# Patient Record
Sex: Female | Born: 1935 | Race: White | Hispanic: No | Marital: Married | Smoking: Never smoker
Health system: Southern US, Community
[De-identification: ages and names within clinical notes are randomized; demographics above are authoritative.]

## PROBLEM LIST (undated history)

## (undated) DIAGNOSIS — N390 Urinary tract infection, site not specified: Secondary | ICD-10-CM

## (undated) DIAGNOSIS — D6959 Other secondary thrombocytopenia: Secondary | ICD-10-CM

## (undated) DIAGNOSIS — Z9289 Personal history of other medical treatment: Secondary | ICD-10-CM

## (undated) DIAGNOSIS — E119 Type 2 diabetes mellitus without complications: Secondary | ICD-10-CM

## (undated) DIAGNOSIS — E282 Polycystic ovarian syndrome: Secondary | ICD-10-CM

## (undated) DIAGNOSIS — E781 Pure hyperglyceridemia: Secondary | ICD-10-CM

## (undated) DIAGNOSIS — Z951 Presence of aortocoronary bypass graft: Secondary | ICD-10-CM

## (undated) DIAGNOSIS — I10 Essential (primary) hypertension: Secondary | ICD-10-CM

## (undated) DIAGNOSIS — K219 Gastro-esophageal reflux disease without esophagitis: Secondary | ICD-10-CM

## (undated) DIAGNOSIS — I2510 Atherosclerotic heart disease of native coronary artery without angina pectoris: Secondary | ICD-10-CM

## (undated) DIAGNOSIS — E060 Acute thyroiditis: Secondary | ICD-10-CM

## (undated) DIAGNOSIS — J189 Pneumonia, unspecified organism: Secondary | ICD-10-CM

## (undated) DIAGNOSIS — E7800 Pure hypercholesterolemia, unspecified: Secondary | ICD-10-CM

## (undated) DIAGNOSIS — E875 Hyperkalemia: Secondary | ICD-10-CM

## (undated) HISTORY — DX: Pure hypercholesterolemia, unspecified: E78.00

## (undated) HISTORY — DX: Acute thyroiditis: E06.0

## (undated) HISTORY — PX: LAPAROSCOPIC CHOLECYSTECTOMY: SUR755

## (undated) HISTORY — PX: DILATION AND CURETTAGE OF UTERUS: SHX78

## (undated) HISTORY — PX: CATARACT EXTRACTION W/ INTRAOCULAR LENS  IMPLANT, BILATERAL: SHX1307

## (undated) HISTORY — PX: TUBAL LIGATION: SHX77

## (undated) HISTORY — DX: Hyperkalemia: E87.5

## (undated) HISTORY — DX: Presence of aortocoronary bypass graft: Z95.1

## (undated) HISTORY — DX: Polycystic ovarian syndrome: E28.2

## (undated) HISTORY — DX: Pure hyperglyceridemia: E78.1

## (undated) HISTORY — DX: Urinary tract infection, site not specified: N39.0

## (undated) HISTORY — DX: Essential (primary) hypertension: I10

## (undated) HISTORY — DX: Atherosclerotic heart disease of native coronary artery without angina pectoris: I25.10

## (undated) HISTORY — DX: Other secondary thrombocytopenia: D69.59

---

## 2008-05-17 DIAGNOSIS — Z9289 Personal history of other medical treatment: Secondary | ICD-10-CM

## 2008-05-17 HISTORY — PX: CARDIAC CATHETERIZATION: SHX172

## 2008-05-17 HISTORY — DX: Personal history of other medical treatment: Z92.89

## 2008-05-17 HISTORY — PX: CORONARY ARTERY BYPASS GRAFT: SHX141

## 2012-01-20 DIAGNOSIS — E109 Type 1 diabetes mellitus without complications: Secondary | ICD-10-CM | POA: Diagnosis not present

## 2012-01-20 DIAGNOSIS — E7800 Pure hypercholesterolemia, unspecified: Secondary | ICD-10-CM | POA: Diagnosis not present

## 2012-01-20 DIAGNOSIS — I10 Essential (primary) hypertension: Secondary | ICD-10-CM | POA: Diagnosis not present

## 2012-02-10 DIAGNOSIS — I10 Essential (primary) hypertension: Secondary | ICD-10-CM | POA: Diagnosis not present

## 2012-02-10 DIAGNOSIS — E109 Type 1 diabetes mellitus without complications: Secondary | ICD-10-CM | POA: Diagnosis not present

## 2012-02-10 DIAGNOSIS — E7800 Pure hypercholesterolemia, unspecified: Secondary | ICD-10-CM | POA: Diagnosis not present

## 2012-09-04 DIAGNOSIS — I10 Essential (primary) hypertension: Secondary | ICD-10-CM | POA: Diagnosis not present

## 2012-09-04 DIAGNOSIS — M25519 Pain in unspecified shoulder: Secondary | ICD-10-CM | POA: Diagnosis not present

## 2012-09-04 DIAGNOSIS — E7800 Pure hypercholesterolemia, unspecified: Secondary | ICD-10-CM | POA: Diagnosis not present

## 2012-10-03 ENCOUNTER — Encounter: Payer: Self-pay | Admitting: *Deleted

## 2012-10-03 ENCOUNTER — Ambulatory Visit (INDEPENDENT_AMBULATORY_CARE_PROVIDER_SITE_OTHER): Payer: Medicare Other | Admitting: Cardiology

## 2012-10-03 ENCOUNTER — Encounter: Payer: Self-pay | Admitting: Cardiology

## 2012-10-03 VITALS — BP 160/90 | HR 66 | Ht 63.0 in | Wt 156.0 lb

## 2012-10-03 DIAGNOSIS — I2510 Atherosclerotic heart disease of native coronary artery without angina pectoris: Secondary | ICD-10-CM | POA: Insufficient documentation

## 2012-10-03 DIAGNOSIS — I10 Essential (primary) hypertension: Secondary | ICD-10-CM | POA: Diagnosis not present

## 2012-10-03 DIAGNOSIS — E781 Pure hyperglyceridemia: Secondary | ICD-10-CM

## 2012-10-03 DIAGNOSIS — Z951 Presence of aortocoronary bypass graft: Secondary | ICD-10-CM | POA: Diagnosis not present

## 2012-10-03 NOTE — Patient Instructions (Signed)
Continue your current medication  Add fish oil 2000 mg daily  I will see you again in 6 months.

## 2012-10-03 NOTE — Progress Notes (Signed)
Jennifer Warner. [name] Date of Birth: [birth year] Medical Record #[id number]  History of Present Illness: Jennifer Warner is seen at the request of Dr. Chestine Spore to establish cardiac care. She is a very pleasant 78 year old white female who has a history of hypertension and dyslipidemia. She also has a history of diabetes. In 2009 she presented with new onset of angina. A Myoview study was abnormal. This led to cardiac catheterization which demonstrated a critical ostial LAD stenosis. She septal he underwent two-vessel coronary bypass surgery in [place] with an LIMA graft to the LAD and a saphenous vein graft to the RCA. She has done very well since her surgery without any recurrent symptoms of angina. She has had no shortness of breath or palpitations. He does stay active primarily with walking. Her husband is a Optician, dispensing and recently took a post here in [place].  Current Outpatient Prescriptions on File Prior to Visit  Medication Sig Dispense Refill  . atorvastatin (LIPITOR) 10 MG tablet Take 10 mg by mouth daily.       . diphenhydramine-acetaminophen (TYLENOL PM) 25-500 MG TABS Take 2 tablets by mouth at bedtime.      . fenofibrate (TRICOR) 145 MG tablet Take 145 mg by mouth daily.       . hydrochlorothiazide (MICROZIDE) 12.5 MG capsule Take 12.5 mg by mouth daily.       Marland Kitchen LANTUS SOLOSTAR 100 UNIT/ML injection Inject 20 Units into the skin at bedtime.       Marland Kitchen losartan (COZAAR) 100 MG tablet Take 100 mg by mouth daily.       . metoprolol tartrate (LOPRESSOR) 25 MG tablet Take 25 mg by mouth 2 (two) times daily.       Marland Kitchen omeprazole (PRILOSEC) 20 MG capsule Take 20 mg by mouth daily.         No Known Allergies  Past Medical History  Diagnosis Date  . CAD (coronary artery disease)   . Diabetes   . Reflux   . Acute thyroiditis   . Polycystic ovaries   . Pure hypercholesterolemia   . Hypertriglyceridemia   . Essential hypertension, benign   . Urinary tract infection, site not specified     . Postsurgical aortocoronary bypass status   . Other secondary thrombocytopenia   . Hyperkalemia   . S/P CABG x 2     Past Surgical History  Procedure Date  . Cholecystectomy   . S/p cabg 8/09    [name] to LAD, SVG to PDA    History  Smoking status  . Never Smoker   Smokeless tobacco  . Not on file    History  Alcohol Use: Not on file    Family History  Problem Relation Age of Onset  . Liver disease Mother 31    deceased  . Cancer Brother     x3  . Stroke Brother     Review of Systems: The review of systems is positive for *as noted in history of present illness.  All other systems were reviewed and are negative.  Physical Exam: BP 160/90  Pulse 66  Ht 5\' 3"  (1.6 m)  Wt 156 lb (70.761 kg)  BMI 27.63 kg/m2 She is very pleasant white female in no acute distress. She is obese.The patient is alert and oriented x 3.  The mood and affect are normal.  The skin is warm and dry.  Color is normal.  The HEENT exam reveals that the sclera are nonicteric.  The mucous membranes are  moist.  The carotids are 2+ without bruits.  There is no thyromegaly.  There is no JVD.  The lungs are clear.  The chest wall is non tender.  She has an old median sternotomy scar. The heart exam reveals a regular rate with a normal S1 and S2.  There are no murmurs, gallops, or rubs.  The PMI is not displaced.   Abdominal exam reveals good bowel sounds.  There is no guarding or rebound.  There is no hepatosplenomegaly or tenderness.  There are no masses.  Exam of the legs reveal no clubbing, cyanosis, or edema.  The legs are without rashes.  The distal pulses are intact.  Cranial nerves II - XII are intact.  Motor and sensory functions are intact.  The gait is normal.  LABORATORY DATA: ECG today demonstrates normal sinus rhythm with a normal ECG. Laboratory data from 09/04/2012 showed a total cholesterol 188, triglycerides 307, HDL 33, and LDL of 94. Glucose is 123. BUN 24, creatinine 1.36. A1c was  7.1%. Assessment / Plan: 1. Coronary disease status post 2 vessel CABG in August of 2009. She is asymptomatic. ECG is normal. We'll continue with her current medical therapy and risk factor modification. I'll followup again in 6 months.  2. Hyperlipidemia. Given her elevated triglycerides I recommended she had 2 g of fish oil daily. Continue TriCor and Lipitor.  3. Diabetes mellitus type 2.  3. Chronic kidney disease stage III. Patient is on an ARB.

## 2012-12-15 DIAGNOSIS — J189 Pneumonia, unspecified organism: Secondary | ICD-10-CM

## 2012-12-15 HISTORY — DX: Pneumonia, unspecified organism: J18.9

## 2013-01-07 ENCOUNTER — Emergency Department (HOSPITAL_COMMUNITY): Payer: Medicare Other

## 2013-01-07 ENCOUNTER — Inpatient Hospital Stay (HOSPITAL_COMMUNITY)
Admission: EM | Admit: 2013-01-07 | Discharge: 2013-01-09 | DRG: 195 | Disposition: A | Discharge status: Home / Self Care | Payer: Medicare Other | Attending: Internal Medicine | Admitting: Internal Medicine

## 2013-01-07 ENCOUNTER — Encounter (HOSPITAL_COMMUNITY): Payer: Self-pay | Admitting: Emergency Medicine

## 2013-01-07 DIAGNOSIS — E282 Polycystic ovarian syndrome: Secondary | ICD-10-CM | POA: Diagnosis present

## 2013-01-07 DIAGNOSIS — J189 Pneumonia, unspecified organism: Secondary | ICD-10-CM | POA: Diagnosis not present

## 2013-01-07 DIAGNOSIS — Z7982 Long term (current) use of aspirin: Secondary | ICD-10-CM

## 2013-01-07 DIAGNOSIS — R0602 Shortness of breath: Secondary | ICD-10-CM | POA: Diagnosis not present

## 2013-01-07 DIAGNOSIS — E785 Hyperlipidemia, unspecified: Secondary | ICD-10-CM | POA: Diagnosis present

## 2013-01-07 DIAGNOSIS — Z794 Long term (current) use of insulin: Secondary | ICD-10-CM

## 2013-01-07 DIAGNOSIS — J984 Other disorders of lung: Secondary | ICD-10-CM | POA: Diagnosis not present

## 2013-01-07 DIAGNOSIS — I2510 Atherosclerotic heart disease of native coronary artery without angina pectoris: Secondary | ICD-10-CM | POA: Diagnosis not present

## 2013-01-07 DIAGNOSIS — E7800 Pure hypercholesterolemia, unspecified: Secondary | ICD-10-CM | POA: Diagnosis present

## 2013-01-07 DIAGNOSIS — R079 Chest pain, unspecified: Secondary | ICD-10-CM | POA: Diagnosis not present

## 2013-01-07 DIAGNOSIS — K219 Gastro-esophageal reflux disease without esophagitis: Secondary | ICD-10-CM | POA: Diagnosis present

## 2013-01-07 DIAGNOSIS — Z79899 Other long term (current) drug therapy: Secondary | ICD-10-CM

## 2013-01-07 DIAGNOSIS — R918 Other nonspecific abnormal finding of lung field: Secondary | ICD-10-CM | POA: Diagnosis not present

## 2013-01-07 DIAGNOSIS — I10 Essential (primary) hypertension: Secondary | ICD-10-CM | POA: Diagnosis not present

## 2013-01-07 DIAGNOSIS — E781 Pure hyperglyceridemia: Secondary | ICD-10-CM | POA: Diagnosis not present

## 2013-01-07 DIAGNOSIS — Z951 Presence of aortocoronary bypass graft: Secondary | ICD-10-CM

## 2013-01-07 DIAGNOSIS — E119 Type 2 diabetes mellitus without complications: Secondary | ICD-10-CM | POA: Diagnosis not present

## 2013-01-07 LAB — HEPATIC FUNCTION PANEL
ALT: 58 U/L — ABNORMAL HIGH (ref 0–35)
AST: 59 U/L — ABNORMAL HIGH (ref 0–37)
Alkaline Phosphatase: 53 U/L (ref 39–117)
Total Protein: 8.4 g/dL — ABNORMAL HIGH (ref 6.0–8.3)

## 2013-01-07 LAB — URINALYSIS, ROUTINE W REFLEX MICROSCOPIC
Glucose, UA: 100 mg/dL — AB
Ketones, ur: 15 mg/dL — AB
Specific Gravity, Urine: 1.024 (ref 1.005–1.030)
pH: 5 (ref 5.0–8.0)

## 2013-01-07 LAB — URINE MICROSCOPIC-ADD ON

## 2013-01-07 LAB — GLUCOSE, CAPILLARY: Glucose-Capillary: 216 mg/dL — ABNORMAL HIGH (ref 70–99)

## 2013-01-07 LAB — CBC
Hemoglobin: 11.9 g/dL — ABNORMAL LOW (ref 12.0–15.0)
Platelets: 292 10*3/uL (ref 150–400)
RBC: 3.82 MIL/uL — ABNORMAL LOW (ref 3.87–5.11)
WBC: 14 10*3/uL — ABNORMAL HIGH (ref 4.0–10.5)

## 2013-01-07 LAB — BASIC METABOLIC PANEL
CO2: 22 mEq/L (ref 19–32)
Calcium: 10.4 mg/dL (ref 8.4–10.5)
GFR calc non Af Amer: 25 mL/min — ABNORMAL LOW (ref >90)
Glucose, Bld: 275 mg/dL — ABNORMAL HIGH (ref 70–99)
Potassium: 3.9 mEq/L (ref 3.5–5.1)
Sodium: 135 mEq/L (ref 135–145)

## 2013-01-07 LAB — POCT I-STAT TROPONIN I

## 2013-01-07 MED ORDER — SODIUM CHLORIDE 0.9 % IV SOLN
INTRAVENOUS | Status: AC
  Administered 2013-01-07: 100 mL/h via INTRAVENOUS
  Administered 2013-01-08: 08:00:00 via INTRAVENOUS

## 2013-01-07 MED ORDER — METOPROLOL TARTRATE 25 MG PO TABS
25.0000 mg | ORAL_TABLET | Freq: Two times a day (BID) | ORAL | Status: DC
  Administered 2013-01-07 – 2013-01-09 (×4): 25 mg via ORAL
  Filled 2013-01-07 (×5): qty 1

## 2013-01-07 MED ORDER — HYDROCODONE-ACETAMINOPHEN 5-325 MG PO TABS: 1.0000 | ORAL_TABLET | ORAL | Status: DC | PRN

## 2013-01-07 MED ORDER — ONDANSETRON HCL 4 MG/2ML IJ SOLN: 4.0000 mg | Freq: Four times a day (QID) | INTRAMUSCULAR | Status: DC | PRN

## 2013-01-07 MED ORDER — DIPHENHYDRAMINE-APAP (SLEEP) 25-500 MG PO TABS: 2.0000 | ORAL_TABLET | Freq: Every day | ORAL | Status: DC

## 2013-01-07 MED ORDER — TECHNETIUM TO 99M ALBUMIN AGGREGATED
6.0000 | Freq: Once | INTRAVENOUS | Status: AC | PRN
  Administered 2013-01-07: 6 via INTRAVENOUS

## 2013-01-07 MED ORDER — LOSARTAN POTASSIUM 50 MG PO TABS
100.0000 mg | ORAL_TABLET | Freq: Every day | ORAL | Status: DC
  Administered 2013-01-07 – 2013-01-09 (×3): 100 mg via ORAL
  Filled 2013-01-07 (×3): qty 2

## 2013-01-07 MED ORDER — HEPARIN SODIUM (PORCINE) 5000 UNIT/ML IJ SOLN
5000.0000 [IU] | Freq: Three times a day (TID) | INTRAMUSCULAR | Status: DC
  Administered 2013-01-07 – 2013-01-09 (×5): 5000 [IU] via SUBCUTANEOUS
  Filled 2013-01-07 (×8): qty 1

## 2013-01-07 MED ORDER — ONDANSETRON HCL 4 MG PO TABS: 4.0000 mg | ORAL_TABLET | Freq: Four times a day (QID) | ORAL | Status: DC | PRN

## 2013-01-07 MED ORDER — SODIUM CHLORIDE 0.9 % IV BOLUS (SEPSIS)
1000.0000 mL | Freq: Once | INTRAVENOUS | Status: AC
  Administered 2013-01-07: 1000 mL via INTRAVENOUS

## 2013-01-07 MED ORDER — ATORVASTATIN CALCIUM 10 MG PO TABS
10.0000 mg | ORAL_TABLET | Freq: Every evening | ORAL | Status: DC
  Administered 2013-01-07 – 2013-01-08 (×2): 10 mg via ORAL
  Filled 2013-01-07 (×3): qty 1

## 2013-01-07 MED ORDER — OXYCODONE-ACETAMINOPHEN 5-325 MG PO TABS
1.0000 | ORAL_TABLET | Freq: Once | ORAL | Status: AC
  Administered 2013-01-07: 1 via ORAL
  Filled 2013-01-07: qty 1

## 2013-01-07 MED ORDER — FENOFIBRATE 160 MG PO TABS
160.0000 mg | ORAL_TABLET | Freq: Every day | ORAL | Status: DC
  Administered 2013-01-07 – 2013-01-09 (×3): 160 mg via ORAL
  Filled 2013-01-07 (×3): qty 1

## 2013-01-07 MED ORDER — DEXTROSE 5 % IV SOLN
500.0000 mg | INTRAVENOUS | Status: DC
  Administered 2013-01-08: 500 mg via INTRAVENOUS
  Filled 2013-01-07 (×2): qty 500

## 2013-01-07 MED ORDER — AZITHROMYCIN 250 MG PO TABS
500.0000 mg | ORAL_TABLET | Freq: Once | ORAL | Status: AC
  Administered 2013-01-07: 500 mg via ORAL
  Filled 2013-01-07: qty 2

## 2013-01-07 MED ORDER — ASPIRIN 81 MG PO CHEW
81.0000 mg | CHEWABLE_TABLET | Freq: Every day | ORAL | Status: DC
  Administered 2013-01-07 – 2013-01-09 (×3): 81 mg via ORAL
  Filled 2013-01-07 (×3): qty 1

## 2013-01-07 MED ORDER — TECHNETIUM TC 99M DIETHYLENETRIAME-PENTAACETIC ACID: 40.0000 | Freq: Once | INTRAVENOUS | Status: DC | PRN

## 2013-01-07 MED ORDER — INSULIN ASPART 100 UNIT/ML ~~LOC~~ SOLN
0.0000 [IU] | Freq: Every day | SUBCUTANEOUS | Status: DC
  Administered 2013-01-07: 3 [IU] via SUBCUTANEOUS

## 2013-01-07 MED ORDER — ZOLPIDEM TARTRATE 5 MG PO TABS
5.0000 mg | ORAL_TABLET | Freq: Every evening | ORAL | Status: DC | PRN
  Administered 2013-01-07: 5 mg via ORAL
  Filled 2013-01-07: qty 1

## 2013-01-07 MED ORDER — SODIUM CHLORIDE 0.9 % IJ SOLN
3.0000 mL | Freq: Two times a day (BID) | INTRAMUSCULAR | Status: DC
  Administered 2013-01-07 – 2013-01-09 (×4): 3 mL via INTRAVENOUS

## 2013-01-07 MED ORDER — DEXTROSE 5 % IV SOLN
1.0000 g | INTRAVENOUS | Status: DC
  Administered 2013-01-08: 1 g via INTRAVENOUS
  Filled 2013-01-07 (×2): qty 10

## 2013-01-07 MED ORDER — HYDROCHLOROTHIAZIDE 12.5 MG PO CAPS
12.5000 mg | ORAL_CAPSULE | Freq: Every day | ORAL | Status: DC
  Administered 2013-01-07 – 2013-01-09 (×3): 12.5 mg via ORAL
  Filled 2013-01-07 (×3): qty 1

## 2013-01-07 MED ORDER — DEXTROSE 5 % IV SOLN
1.0000 g | Freq: Once | INTRAVENOUS | Status: AC
  Administered 2013-01-07: 1 g via INTRAVENOUS
  Filled 2013-01-07: qty 10

## 2013-01-07 MED ORDER — PANTOPRAZOLE SODIUM 40 MG PO TBEC
40.0000 mg | DELAYED_RELEASE_TABLET | Freq: Every day | ORAL | Status: DC
  Administered 2013-01-07 – 2013-01-09 (×3): 40 mg via ORAL
  Filled 2013-01-07 (×2): qty 1

## 2013-01-07 MED ORDER — INSULIN ASPART 100 UNIT/ML ~~LOC~~ SOLN
0.0000 [IU] | Freq: Three times a day (TID) | SUBCUTANEOUS | Status: DC
  Administered 2013-01-08 – 2013-01-09 (×4): 2 [IU] via SUBCUTANEOUS

## 2013-01-07 NOTE — ED Provider Notes (Signed)
MSE was initiated and I personally evaluated the patient and placed orders (if any) at  2:49 PM on January 07, 2013.  The patient appears stable so that the remainder of the MSE may be completed by another provider.  I discussed the patient's x-ray with our radiologist.  Given the patient's abnormal x-ray finding she will have a CT scan of her chest.  On my brief exam the patient is awake, alert, ambulatory, and in no distress, speaking clearly, with no overt dyspnea.  Gerhard Munch, MD 01/07/13 1450

## 2013-01-07 NOTE — ED Provider Notes (Signed)
History     CSN: [id number]  Arrival date & time 01/07/13  1321   First MD Initiated Contact with Patient 01/07/13 1531      Chief Complaint  Patient presents with  . Back Pain  . Tachycardia    (Consider location/radiation/quality/duration/timing/severity/associated sxs/prior treatment) Patient is a 80 y.o. female presenting with back pain.  Back Pain Associated symptoms: chest pain   Associated symptoms: no abdominal pain, no headaches, no numbness and no weakness    patient presents with pain in her right posterior chest. Worse with breathing. Rather acute onset. Chronic cough. Not coughing anything up. No fevers. No abdominal pain. no dysuria. Also some pain in her lower back to her right hip. Worse with movement.   Past Medical History  Diagnosis Date  . CAD (coronary artery disease)   . Diabetes   . Reflux   . Acute thyroiditis   . Polycystic ovaries   . Pure hypercholesterolemia   . Hypertriglyceridemia   . Essential hypertension, benign   . Urinary tract infection, site not specified   . Postsurgical aortocoronary bypass status   . Other secondary thrombocytopenia   . Hyperkalemia   . S/P CABG x 2     Past Surgical History  Procedure Laterality Date  . Cholecystectomy    . S/p cabg  8/09    [name] to LAD, SVG to PDA    Family History  Problem Relation Age of Onset  . Liver disease Mother 73    deceased  . Cancer Brother     x3  . Stroke Brother     History  Substance Use Topics  . Smoking status: Never Smoker   . Smokeless tobacco: Not on file  . Alcohol Use: Not on file    OB History   Grav Para Term Preterm Abortions TAB SAB Ect Mult Living                  Review of Systems  Constitutional: Negative for activity change and appetite change.  HENT: Negative for neck stiffness.   Eyes: Negative for pain.  Respiratory: Positive for cough and shortness of breath. Negative for chest tightness.   Cardiovascular: Positive for chest pain.  Negative for leg swelling.  Gastrointestinal: Negative for nausea, vomiting, abdominal pain and diarrhea.  Genitourinary: Negative for flank pain.  Musculoskeletal: Positive for back pain.  Skin: Negative for rash.  Neurological: Negative for weakness, numbness and headaches.  Psychiatric/Behavioral: Negative for behavioral problems.    Allergies  Review of patient's allergies indicates no known allergies.  Home Medications   No current outpatient prescriptions on file.  BP 168/67  Pulse 82  Temp(Src) 98.7 F (37.1 C) (Oral)  Resp 20  Ht 5\' 3"  (1.6 m)  Wt 154 lb 14.4 oz (70.262 kg)  BMI 27.45 kg/m2  SpO2 93%  Physical Exam  Nursing note and vitals reviewed. Constitutional: She is oriented to person, place, and time. She appears well-developed and well-nourished.  HENT:  Head: Normocephalic and atraumatic.  Eyes: EOM are normal. Pupils are equal, round, and reactive to light.  Neck: Normal range of motion. Neck supple.  Cardiovascular: Regular rhythm and normal heart sounds.   No murmur heard. Tachycardia.  Pulmonary/Chest: Effort normal and breath sounds normal. No respiratory distress. She has no wheezes. She has no rales. She exhibits tenderness.  Tenderness to right lower posterior chest.  Abdominal: Soft. Bowel sounds are normal. She exhibits no distension. There is no tenderness. There is no rebound  and no guarding.  Musculoskeletal: Normal range of motion.  Neurological: She is alert and oriented to person, place, and time. No cranial nerve deficit.  Skin: Skin is warm and dry.  Psychiatric: She has a normal mood and affect. Her speech is normal.    ED Course  Procedures (including critical care time)  Labs Reviewed  BASIC METABOLIC PANEL - Abnormal; Notable for the following:    Glucose, Bld 275 (*)    BUN 27 (*)    Creatinine, Ser 1.88 (*)    GFR calc non Af Amer 25 (*)    GFR calc Af Amer 29 (*)    All other components within normal limits  CBC -  Abnormal; Notable for the following:    WBC 14.0 (*)    RBC 3.82 (*)    Hemoglobin 11.9 (*)    HCT 34.7 (*)    All other components within normal limits  HEPATIC FUNCTION PANEL - Abnormal; Notable for the following:    Total Protein 8.4 (*)    AST 59 (*)    ALT 58 (*)    Total Bilirubin 1.3 (*)    Bilirubin, Direct 0.6 (*)    All other components within normal limits  URINALYSIS, ROUTINE W REFLEX MICROSCOPIC - Abnormal; Notable for the following:    Color, Urine ORANGE (*)    APPearance TURBID (*)    Glucose, UA 100 (*)    Hgb urine dipstick TRACE (*)    Bilirubin Urine MODERATE (*)    Ketones, ur 15 (*)    Protein, ur 100 (*)    Urobilinogen, UA 2.0 (*)    Leukocytes, UA MODERATE (*)    All other components within normal limits  GLUCOSE, CAPILLARY - Abnormal; Notable for the following:    Glucose-Capillary 216 (*)    All other components within normal limits  URINE MICROSCOPIC-ADD ON - Abnormal; Notable for the following:    Squamous Epithelial / LPF FEW (*)    Bacteria, UA MANY (*)    Casts GRANULAR CAST (*)    All other components within normal limits  GLUCOSE, CAPILLARY - Abnormal; Notable for the following:    Glucose-Capillary 269 (*)    All other components within normal limits  URINE CULTURE  CULTURE, EXPECTORATED SPUTUM-ASSESSMENT  TROPONIN I  BASIC METABOLIC PANEL  CBC WITH DIFFERENTIAL  TROPONIN I  LEGIONELLA ANTIGEN, URINE  STREP PNEUMONIAE URINARY ANTIGEN  POCT I-STAT TROPONIN I   Dg Chest 2 View  01/07/2013  *RADIOLOGY REPORT*  Clinical Data: Back pain and tachycardia.  CHEST - 2 VIEW  Comparison: None.  Findings: Two-view exam shows no pulmonary edema.  No pleural effusion.  Interstitial coarsening is evident.  The patient is status post CABG.  3.2 x 2.9 cm flame shaped opacity is identified in the right lower lobe posteriorly.  Imaged bony structures of the thorax are intact.  IMPRESSION: 3 cm flame shaped density in the right lower lobe.  Imaging  features raise concern for neoplasm.  CT chest without contrast recommended to further evaluate.  I discussed these findings by telephone with Dr. [name] at 1438 hours on 01/07/2013.   Original Report Authenticated By: Kennith Center, M.D.    Ct Chest Wo Contrast  01/07/2013  **ADDENDUM** CREATED: 01/07/2013 16:55:03  2.6 cm left thyroid nodule is noted.  Thyroid ultrasound is recommended.  **END ADDENDUM** SIGNED BY: Marlowe Aschoff. Hoss, M.D.   01/07/2013  *RADIOLOGY REPORT*  Clinical Data: Abnormal radiograph  CT CHEST WITHOUT CONTRAST  Technique:  Multidetector CT imaging of the chest was performed following the standard protocol without IV contrast.  Comparison: None.  Findings: The radiographic abnormality corresponds to a 2.1 x 5.1 x 3.1 cm triangular shaped area of heterogeneous opacity characterized by irregular lung markings, lucency, and ground-glass density, in the superior segment of the right lower lobe.  No non- calcified pulmonary nodule.  No solid soft tissue lung mass. There is a small calcified granuloma in the right upper lobe.  Postoperative changes from CABG.  2.6 cm left thyroid mass.  Atherosclerotic calcifications of the aorta and coronary arteries.  No pneumothorax.  No pleural effusion.  Post cholecystectomy.  Diffuse hepatic steatosis.  T8-9 thoracic disc herniation with an element of spinal stenosis is suspected.  IMPRESSION: There is a wedge-shaped triangular opacity in the right upper lobe. Differential diagnosis includes infection, noninfectious inflammatory disorders, pulmonary infarct, and malignancy.  Adenocarcinoma cannot be excluded. Followup study in 3- 6 months is recommended to ensure resolution of this finding.   Original Report Authenticated By: Jolaine Click, M.D.    Nm Pulmonary Perf And Vent  01/07/2013  *RADIOLOGY REPORT*  Clinical Data:  Chest pain.  NUCLEAR MEDICINE VENTILATION - PERFUSION LUNG SCAN  Technique:  Wash-in, equilibrium, and wash-out phase ventilation images  were obtained using Xe-133 gas.  Perfusion images were obtained in multiple projections after intravenous injection of Tc- 84m MAA.  Radiopharmaceuticals:  40 mCi aerosolized technetium DTPA 6.0 mCi Tc-85m MAA.  Comparison:  Chest CT 01/07/2013  Findings: Heterogeneous appearance of the lungs on the ventilation scan likely due to a heterogeneous deposition of the radiopharmaceutical.  There is a defect involving the right posterior lung.  The perfusion scan also demonstrates a subsegmental defect involving the posterior aspect of the right lung which correlates with the ventilation scan (matching defect) and the chest CT.  No other significant abnormalities.  IMPRESSION: Low probability ventilation perfusion lung scan for pulmonary embolism.   Original Report Authenticated By: Rudie Meyer, M.D.      1. Community acquired pneumonia      Date: 01/07/2013  Rate: 129  Rhythm: sinus tachycardia  QRS Axis: normal  Intervals: normal  ST/T Wave abnormalities: nonspecific ST/T changes  Conduction Disutrbances:none  Narrative Interpretation: tachy is new. Q in III.  Old EKG Reviewed: changes noted    MDM  Patient with shortness of breath and chest pain. Also at tachycardia. X-ray shows infiltrate. CT chest does not delineate much more clearly. With the wedge shape of it could be a pulmonary infarct. She has an elevated creatinine so CT angiogram not be done. VQ was done and was low probability. She be treated as a community acquired pneumonia admitted to internal medicine        Harrold Donath R. [name], MD 01/08/13 [phone]

## 2013-01-07 NOTE — ED Notes (Signed)
Pt transported to nuclear medicine. Receiving fluid bolus.

## 2013-01-07 NOTE — ED Notes (Signed)
ED MD made aware of pt drop in BP and HR. Stating start IV fluids send patient for vent/perfusion scan. Pt denies dizziness or weakness,

## 2013-01-07 NOTE — H&P (Signed)
History and Physical  Jennifer Warner. [name]:[id number] DOB: Mar 26, 1934 DOA: 01/07/2013  Referring physician: ER  PCP: [name], MD   Chief Complaint: Shortness of breath  HPI:  Patient is 79 year old female with past medical history most significant for diabetes, coronary artery disease status post CABG, hyperlipidemia, reflux disease, hypertension and thrombocytopenia who comes in to the hospital with chief complaints of right-sided chest/back pain and shortness of breath. Pain is described as 9/10, started 5 days prior to admission, worse with coughing, no radiation noted, sharp in character, worse with deep breathing(patient unable to take deep breaths), no relieving factors. Associated with chills since 1 day and decreased appetite for last 2 days. Patient denies any fever, sputum production. Patient has chronic cough which is not worse than usual. Patient also had tachycardia in the ER.  Initial evaluation suggested elevated white count, opacity in the right lower lobe of the lung which was further evaluated by a CT chest and a VQ scan. Dr. [name] asked me to admit the patient for community-acquired pneumonia.  15 point review of system was negative except as noted above.  Past Medical History  Diagnosis Date  . CAD (coronary artery disease)   . Diabetes   . Reflux   . Acute thyroiditis   . Polycystic ovaries   . Pure hypercholesterolemia   . Hypertriglyceridemia   . Essential hypertension, benign   . Urinary tract infection, site not specified   . Postsurgical aortocoronary bypass status   . Other secondary thrombocytopenia   . Hyperkalemia   . S/P CABG x 2     Past Surgical History  Procedure Laterality Date  . Cholecystectomy    . S/p cabg  8/09    [name] to LAD, SVG to PDA    Social History:  reports that she has never smoked. She does not have any smokeless tobacco history on file. Her alcohol and drug histories are not on file.  No Known  Allergies  Family History  Problem Relation Age of Onset  . Liver disease Mother 73    deceased  . Cancer Brother     x3  . Stroke Brother      Prior to Admission medications   Medication Sig Start Date End Date Taking? Authorizing Provider  aspirin 81 MG chewable tablet Chew 81 mg by mouth daily.   Yes Historical Provider, MD  atorvastatin (LIPITOR) 10 MG tablet Take 10 mg by mouth every evening.  09/22/12  Yes Historical Provider, MD  diphenhydramine-acetaminophen (TYLENOL PM) 25-500 MG TABS Take 2 tablets by mouth at bedtime.   Yes Historical Provider, MD  fenofibrate (TRICOR) 145 MG tablet Take 145 mg by mouth every evening.  09/21/12  Yes Historical Provider, MD  hydrochlorothiazide (MICROZIDE) 12.5 MG capsule Take 12.5 mg by mouth daily.  07/24/12  Yes Historical Provider, MD  LANTUS SOLOSTAR 100 UNIT/ML injection Inject 20 Units into the skin at bedtime.  09/22/12  Yes Historical Provider, MD  losartan (COZAAR) 100 MG tablet Take 100 mg by mouth daily.  09/28/12  Yes Historical Provider, MD  metoprolol tartrate (LOPRESSOR) 25 MG tablet Take 25 mg by mouth 2 (two) times daily.  09/27/12  Yes Historical Provider, MD  omeprazole (PRILOSEC) 20 MG capsule Take 20 mg by mouth every evening.  07/20/12  Yes Historical Provider, MD   Physical Exam: Filed Vitals:   01/07/13 1407 01/07/13 1559 01/07/13 1721 01/07/13 1827  BP: 120/60 100/55 70/40 104/63  Pulse: 127 121 100 98  Temp:  98.6 F (37 C)     TempSrc: Oral     Resp: 18 20 15 18   SpO2: 96% 96% 95% 95%   Physical Exam: General: Vital signs reviewed and noted. Well-developed, well-nourished, in no acute distress; alert, appropriate and cooperative throughout examination.  Head: Normocephalic, atraumatic.  Eyes: PERRL, EOMI, No signs of anemia or jaundince.  Nose: Mucous membranes moist, not inflammed, nonerythematous.  Throat: Oropharynx nonerythematous, no exudate appreciated.   Neck: No deformities, masses, or tenderness  noted.Supple, No carotid Bruits, no JVD.  Lungs:  Normal respiratory effort. Crackles in the right lower lobe otherwise clear to auscultation   Heart: RRR. S1 and S2 normal without gallop, murmur, or rubs.  Abdomen:  BS normoactive. Soft, Nondistended, non-tender.  No masses or organomegaly.  Extremities: No pretibial edema.  Neurologic: A&O X3, CN II - XII are grossly intact. Motor strength is 5/5 in the all 4 extremities, Sensations intact to light touch, Cerebellar signs negative.  Skin: No visible rashes, scars.     Wt Readings from Last 3 Encounters:  10/03/12 156 lb (70.761 kg)    Labs on Admission:  Basic Metabolic Panel:  Recent Labs Lab 01/07/13 1551  NA 135  K 3.9  CL 97  CO2 22  GLUCOSE 275*  BUN 27*  CREATININE 1.88*  CALCIUM 10.4    Liver Function Tests:  Recent Labs Lab 01/07/13 1551  AST 59*  ALT 58*  ALKPHOS 53  BILITOT 1.3*  PROT 8.4*  ALBUMIN 3.6   CBC:  Recent Labs Lab 01/07/13 1551  WBC 14.0*  HGB 11.9*  HCT 34.7*  MCV 90.8  PLT 292    Troponin (Point of Care Test)  Recent Labs  01/07/13 1558  TROPIPOC 0.06    CBG:  Recent Labs Lab 01/07/13 1901  GLUCAP 216*     Radiological Exams on Admission: Dg Chest 2 View  01/07/2013  *RADIOLOGY REPORT*  Clinical Data: Back pain and tachycardia.  CHEST - 2 VIEW  Comparison: None.  Findings: Two-view exam shows no pulmonary edema.  No pleural effusion.  Interstitial coarsening is evident.  The patient is status post CABG.  3.2 x 2.9 cm flame shaped opacity is identified in the right lower lobe posteriorly.  Imaged bony structures of the thorax are intact.  IMPRESSION: 3 cm flame shaped density in the right lower lobe.  Imaging features raise concern for neoplasm.  CT chest without contrast recommended to further evaluate.  I discussed these findings by telephone with Dr. [name] at 1438 hours on 01/07/2013.   Original Report Authenticated By: Kennith Center, M.D.    Ct Chest Wo  Contrast  01/07/2013  **ADDENDUM** CREATED: 01/07/2013 16:55:03  2.6 cm left thyroid nodule is noted.  Thyroid ultrasound is recommended.  **END ADDENDUM** SIGNED BY: Marlowe Aschoff. Hoss, M.D.   01/07/2013  *RADIOLOGY REPORT*  Clinical Data: Abnormal radiograph  CT CHEST WITHOUT CONTRAST  Technique:  Multidetector CT imaging of the chest was performed following the standard protocol without IV contrast.  Comparison: None.  Findings: The radiographic abnormality corresponds to a 2.1 x 5.1 x 3.1 cm triangular shaped area of heterogeneous opacity characterized by irregular lung markings, lucency, and ground-glass density, in the superior segment of the right lower lobe.  No non- calcified pulmonary nodule.  No solid soft tissue lung mass. There is a small calcified granuloma in the right upper lobe.  Postoperative changes from CABG.  2.6 cm left thyroid mass.  Atherosclerotic calcifications of the aorta and coronary  arteries.  No pneumothorax.  No pleural effusion.  Post cholecystectomy.  Diffuse hepatic steatosis.  T8-9 thoracic disc herniation with an element of spinal stenosis is suspected.  IMPRESSION: There is a wedge-shaped triangular opacity in the right upper lobe. Differential diagnosis includes infection, noninfectious inflammatory disorders, pulmonary infarct, and malignancy.  Adenocarcinoma cannot be excluded. Followup study in 3- 6 months is recommended to ensure resolution of this finding.   Original Report Authenticated By: Jolaine Click, M.D.    Nm Pulmonary Perf And Vent  01/07/2013  *RADIOLOGY REPORT*  Clinical Data:  Chest pain.  NUCLEAR MEDICINE VENTILATION - PERFUSION LUNG SCAN  Technique:  Wash-in, equilibrium, and wash-out phase ventilation images were obtained using Xe-133 gas.  Perfusion images were obtained in multiple projections after intravenous injection of Tc- 36m MAA.  Radiopharmaceuticals:  40 mCi aerosolized technetium DTPA 6.0 mCi Tc-52m MAA.  Comparison:  Chest CT 01/07/2013  Findings:  Heterogeneous appearance of the lungs on the ventilation scan likely due to a heterogeneous deposition of the radiopharmaceutical.  There is a defect involving the right posterior lung.  The perfusion scan also demonstrates a subsegmental defect involving the posterior aspect of the right lung which correlates with the ventilation scan (matching defect) and the chest CT.  No other significant abnormalities.  IMPRESSION: Low probability ventilation perfusion lung scan for pulmonary embolism.   Original Report Authenticated By: Rudie Meyer, M.D.     EKG: Independently reviewed.    Principal Problem:   Community acquired pneumonia Active Problems:   CAD (coronary artery disease)   Hypertriglyceridemia   Essential hypertension, benign   Type 2 diabetes mellitus   Assessment/Plan 79 year old female with past medical history of coronary artery disease status post CABG, hypertension, hypertriglyceridemia, diabetes and reflux disease who comes in with acute onset chest pain and shortness of breath which seems to be most likely related to the opacity on the right lower lobe and with elevated white count is most likely diagnosis is community-acquired pneumonia. The pain is pleuritic in nature but is usually seen with pneumonia although given patient's coronary artery disease history in acute coronary syndrome cannot be ruled out completely.   Community acquired pneumonia: I will start the patient on treatment for community-acquired pneumonia with Rocephin and azithromycin. Legionella and strep antigen and urine -Blood cultures x2 -Sputum cultures x1 -Follow clinically -IV fluids -Followup chest x-ray in 6 weeks -Followup CT scan in 3-6 months as recommended by radiology  Coronary artery disease: Cycle cardiac enzymes x3 given that patient has a history of coronary artery disease in the setting of acute chest pain -Continue home medications  Type 2 diabetes: Sliding scale insulin while patient  does hospitalize.  Hypertriglyceridemia and hypertension: Will continue home medications at this time.  Heparin 5000 X 3 times a day for DVT prophylaxis.   Code Status: Full code Family Communication: Updated at bedside Disposition Plan/Anticipated LOS: Guarded  Time spent: 70 minutes  Lars Mage, MD  [facility] Team 5  If 7PM-7AM, please contact night-coverage at [email or web address], password [facility] 01/07/2013, 8:48 PM

## 2013-01-07 NOTE — ED Notes (Signed)
Pt c/o back pain into hip x 3 days; pt noted to be very uncomfortable and tachycardic; pt sts increase CBG at 160 and htn

## 2013-01-08 ENCOUNTER — Encounter (HOSPITAL_COMMUNITY): Payer: Self-pay

## 2013-01-08 DIAGNOSIS — I10 Essential (primary) hypertension: Secondary | ICD-10-CM

## 2013-01-08 DIAGNOSIS — I2510 Atherosclerotic heart disease of native coronary artery without angina pectoris: Secondary | ICD-10-CM

## 2013-01-08 LAB — BASIC METABOLIC PANEL
GFR calc Af Amer: 39 mL/min — ABNORMAL LOW (ref >90)
GFR calc non Af Amer: 33 mL/min — ABNORMAL LOW (ref >90)
Potassium: 3.9 mEq/L (ref 3.5–5.1)
Sodium: 137 mEq/L (ref 135–145)

## 2013-01-08 LAB — CBC WITH DIFFERENTIAL/PLATELET
Basophils Relative: 0 % (ref 0–1)
Hemoglobin: 10.1 g/dL — ABNORMAL LOW (ref 12.0–15.0)
Lymphocytes Relative: 8 % — ABNORMAL LOW (ref 12–46)
Lymphs Abs: 0.8 10*3/uL (ref 0.7–4.0)
MCHC: 34.9 g/dL (ref 30.0–36.0)
Monocytes Relative: 9 % (ref 3–12)
Neutro Abs: 8.4 10*3/uL — ABNORMAL HIGH (ref 1.7–7.7)
Neutrophils Relative %: 83 % — ABNORMAL HIGH (ref 43–77)
RBC: 3.23 MIL/uL — ABNORMAL LOW (ref 3.87–5.11)
WBC: 10.1 10*3/uL (ref 4.0–10.5)

## 2013-01-08 LAB — GLUCOSE, CAPILLARY: Glucose-Capillary: 157 mg/dL — ABNORMAL HIGH (ref 70–99)

## 2013-01-08 LAB — LEGIONELLA ANTIGEN, URINE

## 2013-01-08 LAB — TROPONIN I: Troponin I: <0.3 ng/mL (ref <0.30)

## 2013-01-08 NOTE — Progress Notes (Signed)
The patient arrived to 59.  The patient was oriented to the unit and placed on telemetry.  VS were taken and the patient was assessed.  The call bell was placed within reach and the bed alarm was turned on.  The patient is not complaining of pain at this time.  The RN will carry out the orders and continue to monitor the patient.

## 2013-01-08 NOTE — Progress Notes (Signed)
Utilization Review Completed.   Sadao Weyer, RN, BSN Nurse Case Manager  [phone]  

## 2013-01-08 NOTE — Progress Notes (Signed)
Inpatient Diabetes Program Recommendations  AACE/ADA: New Consensus Statement on Inpatient Glycemic Control (2013)  Target Ranges:  Prepandial:   less than 140 mg/dL      Peak postprandial:   less than 180 mg/dL (1-2 hours)      Critically ill patients:  140 - 180 mg/dL   Reason for Visit: Hyperglycemia ] Results for Jennifer Warner, Jennifer Warner (MRN [id number]) as of 01/08/2013 11:56  Ref. Range 01/07/2013 19:01 01/07/2013 22:27 01/08/2013 06:09 01/08/2013 11:23  Glucose-Capillary Latest Range: 70-99 mg/dL 409 (H) 811 (H) 914 (H) 157 (H)  Results for Jennifer Warner, Jennifer Warner (MRN [id number]) as of 01/08/2013 11:56  Ref. Range 01/07/2013 15:51 01/08/2013 06:05  Glucose Latest Range: 70-99 mg/dL 086 (H) 578 (H)   Inpatient Diabetes Program Recommendations Insulin - Basal: Add 1/2 home dose Lantus - 10 units QHS HgbA1C: Please check HgbA1C to assess glycemic control prior to hospitalization  Note: Will follow.  Thank you. Ailene Ards, RD, LDN, CDE Inpatient Diabetes Coordinator [phone]

## 2013-01-08 NOTE — Progress Notes (Signed)
[facility]             Progress Note   Subjective: No complaints. Husband at bedside and updated on plan of care.  Objective: Vital signs in last 24 hours: Temp:  [98.1 F (36.7 C)-99.8 F (37.7 C)] 98.5 F (36.9 C) (03/25 1353) Pulse Rate:  [82-121] 97 (03/25 1353) Resp:  [14-26] 20 (03/25 1353) BP: (49-168)/(35-67) 131/51 mmHg (03/25 1353) SpO2:  [93 %-100 %] 95 % (03/25 1353) Weight:  [70.262 kg (154 lb 14.4 oz)-70.353 kg (155 lb 1.6 oz)] 70.353 kg (155 lb 1.6 oz) (03/25 0442) Weight change:  Last BM Date: 01/06/13  Intake/Output from previous day: 03/24 0701 - 03/25 0700 In: 3 [I.V.:3] Out: 350 [Urine:350] Total I/O In: 1003 [P.O.:600; I.V.:403] Out: 400 [Urine:400]   Physical Exam: General: Alert, awake, oriented x3, in no acute distress. HEENT: No bruits, no goiter. Heart: Regular rate and rhythm, without murmurs, rubs, gallops. Lungs: Clear to auscultation bilaterally. Abdomen: Soft, nontender, nondistended, positive bowel sounds. Extremities: No clubbing cyanosis or edema with positive pedal pulses. Neuro: Grossly intact, nonfocal.    Lab Results: Basic Metabolic Panel:  Recent Labs  16/10/96 1551 01/08/13 0605  NA 135 137  K 3.9 3.9  CL 97 102  CO2 22 23  GLUCOSE 275* 185*  BUN 27* 31*  CREATININE 1.88* 1.47*  CALCIUM 10.4 9.3   Liver Function Tests:  Recent Labs  01/07/13 1551  AST 59*  ALT 58*  ALKPHOS 53  BILITOT 1.3*  PROT 8.4*  ALBUMIN 3.6   CBC:  Recent Labs  01/07/13 1551 01/08/13 0605  WBC 14.0* 10.1  NEUTROABS  --  8.4*  HGB 11.9* 10.1*  HCT 34.7* 28.9*  MCV 90.8 89.5  PLT 292 243   Cardiac Enzymes:  Recent Labs  01/07/13 2207 01/08/13 0411  TROPONINI <0.30 <0.30   CBG:  Recent Labs  01/07/13 1901 01/07/13 2227 01/08/13 0609 01/08/13 1123  GLUCAP 216* 269* 173* 157*   Urinalysis:  Recent Labs  01/07/13 2052  COLORURINE ORANGE*  LABSPEC 1.024  PHURINE 5.0  GLUCOSEU 100*  HGBUR  TRACE*  BILIRUBINUR MODERATE*  KETONESUR 15*  PROTEINUR 100*  UROBILINOGEN 2.0*  NITRITE NEGATIVE  LEUKOCYTESUR MODERATE*    Studies/Results: Dg Chest 2 View  01/07/2013  *RADIOLOGY REPORT*  Clinical Data: Back pain and tachycardia.  CHEST - 2 VIEW  Comparison: None.  Findings: Two-view exam shows no pulmonary edema.  No pleural effusion.  Interstitial coarsening is evident.  The patient is status post CABG.  3.2 x 2.9 cm flame shaped opacity is identified in the right lower lobe posteriorly.  Imaged bony structures of the thorax are intact.  IMPRESSION: 3 cm flame shaped density in the right lower lobe.  Imaging features raise concern for neoplasm.  CT chest without contrast recommended to further evaluate.  I discussed these findings by telephone with Dr. [name] at 1438 hours on 01/07/2013.   Original Report Authenticated By: Kennith Center, M.D.    Ct Chest Wo Contrast  01/07/2013  **ADDENDUM** CREATED: 01/07/2013 16:55:03  2.6 cm left thyroid nodule is noted.  Thyroid ultrasound is recommended.  **END ADDENDUM** SIGNED BY: Marlowe Aschoff. Hoss, M.D.   01/07/2013  *RADIOLOGY REPORT*  Clinical Data: Abnormal radiograph  CT CHEST WITHOUT CONTRAST  Technique:  Multidetector CT imaging of the chest was performed following the standard protocol without IV contrast.  Comparison: None.  Findings: The radiographic abnormality corresponds to a 2.1 x 5.1 x 3.1 cm triangular shaped area  of heterogeneous opacity characterized by irregular lung markings, lucency, and ground-glass density, in the superior segment of the right lower lobe.  No non- calcified pulmonary nodule.  No solid soft tissue lung mass. There is a small calcified granuloma in the right upper lobe.  Postoperative changes from CABG.  2.6 cm left thyroid mass.  Atherosclerotic calcifications of the aorta and coronary arteries.  No pneumothorax.  No pleural effusion.  Post cholecystectomy.  Diffuse hepatic steatosis.  T8-9 thoracic disc herniation with  an element of spinal stenosis is suspected.  IMPRESSION: There is a wedge-shaped triangular opacity in the right upper lobe. Differential diagnosis includes infection, noninfectious inflammatory disorders, pulmonary infarct, and malignancy.  Adenocarcinoma cannot be excluded. Followup study in 3- 6 months is recommended to ensure resolution of this finding.   Original Report Authenticated By: Jolaine Click, M.D.    Nm Pulmonary Perf And Vent  01/07/2013  *RADIOLOGY REPORT*  Clinical Data:  Chest pain.  NUCLEAR MEDICINE VENTILATION - PERFUSION LUNG SCAN  Technique:  Wash-in, equilibrium, and wash-out phase ventilation images were obtained using Xe-133 gas.  Perfusion images were obtained in multiple projections after intravenous injection of Tc- 67m MAA.  Radiopharmaceuticals:  40 mCi aerosolized technetium DTPA 6.0 mCi Tc-55m MAA.  Comparison:  Chest CT 01/07/2013  Findings: Heterogeneous appearance of the lungs on the ventilation scan likely due to a heterogeneous deposition of the radiopharmaceutical.  There is a defect involving the right posterior lung.  The perfusion scan also demonstrates a subsegmental defect involving the posterior aspect of the right lung which correlates with the ventilation scan (matching defect) and the chest CT.  No other significant abnormalities.  IMPRESSION: Low probability ventilation perfusion lung scan for pulmonary embolism.   Original Report Authenticated By: Rudie Meyer, M.D.     Medications: Scheduled Meds: . aspirin  81 mg Oral Daily  . atorvastatin  10 mg Oral QPM  . azithromycin  500 mg Intravenous Q24H  . cefTRIAXone (ROCEPHIN)  IV  1 g Intravenous Q24H  . fenofibrate  160 mg Oral Daily  . heparin  5,000 Units Subcutaneous Q8H  . hydrochlorothiazide  12.5 mg Oral Daily  . insulin aspart  0-5 Units Subcutaneous QHS  . insulin aspart  0-9 Units Subcutaneous TID WC  . losartan  100 mg Oral Daily  . metoprolol tartrate  25 mg Oral BID  . pantoprazole  40 mg  Oral Daily  . sodium chloride  3 mL Intravenous Q12H   Continuous Infusions:  PRN Meds:.HYDROcodone-acetaminophen, ondansetron (ZOFRAN) IV, ondansetron, zolpidem  Assessment/Plan:  Principal Problem:   Community acquired pneumonia Active Problems:   CAD (coronary artery disease)   Hypertriglyceridemia   Essential hypertension, benign   Type 2 diabetes mellitus   CAP -Continue rocephin/azithro. -All cx data is pending. -Leukocytosis resolved. -Cough improved. -Still has some pleuritic chest pain. -Will need repeat chest CT in 3-6 months to completely rule out a malignancy as per findings of CT chest.  Chest Pain -Likely related to her CAP. -Has ruled out for ACS.  DM -Fair control.  Disposition -Likely DC home in am.   Time spent coordinating care: 35 minutes.   LOS: 1 day   [facility] Pager: [phone] 01/08/2013, 2:28 PM

## 2013-01-09 DIAGNOSIS — E119 Type 2 diabetes mellitus without complications: Secondary | ICD-10-CM

## 2013-01-09 LAB — URINE CULTURE: Colony Count: 15000

## 2013-01-09 LAB — BASIC METABOLIC PANEL
BUN: 23 mg/dL (ref 6–23)
CO2: 25 mEq/L (ref 19–32)
Calcium: 9.9 mg/dL (ref 8.4–10.5)
Glucose, Bld: 172 mg/dL — ABNORMAL HIGH (ref 70–99)
Sodium: 137 mEq/L (ref 135–145)

## 2013-01-09 LAB — CBC
Hemoglobin: 10.2 g/dL — ABNORMAL LOW (ref 12.0–15.0)
MCH: 31.2 pg (ref 26.0–34.0)
MCHC: 35.3 g/dL (ref 30.0–36.0)
MCV: 88.4 fL (ref 78.0–100.0)
Platelets: 293 10*3/uL (ref 150–400)
RBC: 3.27 MIL/uL — ABNORMAL LOW (ref 3.87–5.11)

## 2013-01-09 MED ORDER — LEVOFLOXACIN 750 MG PO TABS: 750.0000 mg | ORAL_TABLET | Freq: Every day | ORAL | Status: DC

## 2013-01-09 MED ORDER — HYDROCODONE-ACETAMINOPHEN 5-325 MG PO TABS: 1.0000 | ORAL_TABLET | ORAL | Status: DC | PRN

## 2013-01-09 NOTE — Progress Notes (Signed)
Patient's IV and telemetry has been discontinued; patient and husband verbalizes understanding of discharge instructions and patient will be discharged with Home Health services.  Lorretta Harp RN

## 2013-01-09 NOTE — Progress Notes (Signed)
Patient's O2 saturations on room air is 96%; will continue to monitor patient. Lorretta Harp RN

## 2013-01-09 NOTE — Discharge Summary (Signed)
Physician Discharge Summary  Patient ID: Jennifer Warner. [name] MRN: [id number] DOB/AGE: 1934-08-30 79 y.o.  Admit date: 01/07/2013 Discharge date: 01/09/2013  Primary Care Physician:  [name], MD   Discharge Diagnoses:    Principal Problem:   Community acquired pneumonia Active Problems:   CAD (coronary artery disease)   Hypertriglyceridemia   Essential hypertension, benign   Type 2 diabetes mellitus      Medication List    TAKE these medications       aspirin 81 MG chewable tablet  Chew 81 mg by mouth daily.     atorvastatin 10 MG tablet  Commonly known as:  LIPITOR  Take 10 mg by mouth every evening.     diphenhydramine-acetaminophen 25-500 MG Tabs  Commonly known as:  TYLENOL PM  Take 2 tablets by mouth at bedtime.     fenofibrate 145 MG tablet  Commonly known as:  TRICOR  Take 145 mg by mouth every evening.     hydrochlorothiazide 12.5 MG capsule  Commonly known as:  MICROZIDE  Take 12.5 mg by mouth daily.     HYDROcodone-acetaminophen 5-325 MG per tablet  Commonly known as:  NORCO/VICODIN  Take 1-2 tablets by mouth every 4 (four) hours as needed.     LANTUS SOLOSTAR 100 UNIT/ML injection  Generic drug:  insulin glargine  Inject 20 Units into the skin at bedtime.     levofloxacin 750 MG tablet  Commonly known as:  LEVAQUIN  Take 1 tablet (750 mg total) by mouth daily.     losartan 100 MG tablet  Commonly known as:  COZAAR  Take 100 mg by mouth daily.     metoprolol tartrate 25 MG tablet  Commonly known as:  LOPRESSOR  Take 25 mg by mouth 2 (two) times daily.     omeprazole 20 MG capsule  Commonly known as:  PRILOSEC  Take 20 mg by mouth every evening.         Disposition and Follow-up:  Will be discharged home today in stable and improved condition. Has been advised to follow up with her PCP in 2 weeks. Will need a repeat CT scan in 3 months to fully rule out lung malignancy.  Consults:  None   Significant Diagnostic Studies:  Nm  Pulmonary Perf And Vent  01/07/2013  *RADIOLOGY REPORT*  Clinical Data:  Chest pain.  NUCLEAR MEDICINE VENTILATION - PERFUSION LUNG SCAN  Technique:  Wash-in, equilibrium, and wash-out phase ventilation images were obtained using Xe-133 gas.  Perfusion images were obtained in multiple projections after intravenous injection of Tc- 7m MAA.  Radiopharmaceuticals:  40 mCi aerosolized technetium DTPA 6.0 mCi Tc-81m MAA.  Comparison:  Chest CT 01/07/2013  Findings: Heterogeneous appearance of the lungs on the ventilation scan likely due to a heterogeneous deposition of the radiopharmaceutical.  There is a defect involving the right posterior lung.  The perfusion scan also demonstrates a subsegmental defect involving the posterior aspect of the right lung which correlates with the ventilation scan (matching defect) and the chest CT.  No other significant abnormalities.  IMPRESSION: Low probability ventilation perfusion lung scan for pulmonary embolism.   Original Report Authenticated By: Rudie Meyer, M.D.     Brief H and P: For complete details please refer to admission H and P, but in brief patient is a 79 year old female with past medical history most significant for diabetes, coronary artery disease status post CABG, hyperlipidemia, reflux disease, hypertension and thrombocytopenia who comes in to the hospital with chief complaints of  right-sided chest/back pain and shortness of breath. Pain is described as 9/10, started 5 days prior to admission, worse with coughing, no radiation noted, sharp in character, worse with deep breathing(patient unable to take deep breaths), no relieving factors. Associated with chills since 1 day and decreased appetite for last 2 days. Patient denies any fever, sputum production. Patient has chronic cough which is not worse than usual. Patient also had tachycardia in the ER.  Initial evaluation suggested elevated white count, opacity in the right lower lobe of the lung which was  further evaluated by a CT chest and a VQ scan. We were asked to admit her for further evaluation and management.     Hospital Course:  Principal Problem:   Community acquired pneumonia Active Problems:   CAD (coronary artery disease)   Hypertriglyceridemia   Essential hypertension, benign   Type 2 diabetes mellitus   Chest Pain -Related to her PNA. -Ruled out for ACS with negative troponins and EKGs without acute ischemic changes.  CAP -Will be discharged on 8 days of levaquin. -Is not requiring oxygen. -Leukocytosis has resolved. -Will need a repeat chest CT in 3 months to make sure the findings seen were related to her PNA. If still present, will need a biopsy to rule out malignancy.  Rest of chronic medical issues have been stable this hospitalization.    Time spent on Discharge: Greater than 30 minutes.  SignedChaya Jan [facility] Pager: [phone] 01/09/2013, 5:24 PM

## 2013-01-09 NOTE — Progress Notes (Signed)
01/07/13 1100 In to speak with pt. About home health services.  After looking over list of home health agencies, pt. Chose [facility].  TC to Lupita Leash, with [facility] to give referral for [facility] RN, HH PT, and HH aide.  Pt. To dc home today. Tera Mater, RN, BSN NCM [phone]

## 2013-01-10 DIAGNOSIS — E119 Type 2 diabetes mellitus without complications: Secondary | ICD-10-CM | POA: Diagnosis not present

## 2013-01-10 DIAGNOSIS — J189 Pneumonia, unspecified organism: Secondary | ICD-10-CM | POA: Diagnosis not present

## 2013-01-10 DIAGNOSIS — I2510 Atherosclerotic heart disease of native coronary artery without angina pectoris: Secondary | ICD-10-CM | POA: Diagnosis not present

## 2013-01-10 DIAGNOSIS — I10 Essential (primary) hypertension: Secondary | ICD-10-CM | POA: Diagnosis not present

## 2013-01-10 DIAGNOSIS — R5383 Other fatigue: Secondary | ICD-10-CM | POA: Diagnosis not present

## 2013-01-10 DIAGNOSIS — R5381 Other malaise: Secondary | ICD-10-CM | POA: Diagnosis not present

## 2013-01-14 DIAGNOSIS — I10 Essential (primary) hypertension: Secondary | ICD-10-CM | POA: Diagnosis not present

## 2013-01-14 DIAGNOSIS — I2510 Atherosclerotic heart disease of native coronary artery without angina pectoris: Secondary | ICD-10-CM | POA: Diagnosis not present

## 2013-01-14 DIAGNOSIS — R5381 Other malaise: Secondary | ICD-10-CM | POA: Diagnosis not present

## 2013-01-14 DIAGNOSIS — E119 Type 2 diabetes mellitus without complications: Secondary | ICD-10-CM | POA: Diagnosis not present

## 2013-01-14 DIAGNOSIS — J189 Pneumonia, unspecified organism: Secondary | ICD-10-CM | POA: Diagnosis not present

## 2013-01-18 DIAGNOSIS — R5381 Other malaise: Secondary | ICD-10-CM | POA: Diagnosis not present

## 2013-01-18 DIAGNOSIS — I2510 Atherosclerotic heart disease of native coronary artery without angina pectoris: Secondary | ICD-10-CM | POA: Diagnosis not present

## 2013-01-18 DIAGNOSIS — I10 Essential (primary) hypertension: Secondary | ICD-10-CM | POA: Diagnosis not present

## 2013-01-18 DIAGNOSIS — E119 Type 2 diabetes mellitus without complications: Secondary | ICD-10-CM | POA: Diagnosis not present

## 2013-01-18 DIAGNOSIS — J189 Pneumonia, unspecified organism: Secondary | ICD-10-CM | POA: Diagnosis not present

## 2013-01-21 DIAGNOSIS — J189 Pneumonia, unspecified organism: Secondary | ICD-10-CM | POA: Diagnosis not present

## 2013-02-07 ENCOUNTER — Other Ambulatory Visit: Payer: Self-pay | Admitting: Internal Medicine

## 2013-02-07 ENCOUNTER — Ambulatory Visit (HOSPITAL_COMMUNITY)
Admission: RE | Admit: 2013-02-07 | Discharge: 2013-02-07 | Disposition: A | Discharge status: Home / Self Care | Payer: Medicare Other | Source: Ambulatory Visit | Attending: Internal Medicine | Admitting: Internal Medicine

## 2013-02-07 DIAGNOSIS — J189 Pneumonia, unspecified organism: Secondary | ICD-10-CM

## 2013-02-07 DIAGNOSIS — Z09 Encounter for follow-up examination after completed treatment for conditions other than malignant neoplasm: Secondary | ICD-10-CM | POA: Diagnosis not present

## 2013-02-11 DIAGNOSIS — I10 Essential (primary) hypertension: Secondary | ICD-10-CM | POA: Diagnosis not present

## 2013-02-11 DIAGNOSIS — IMO0001 Reserved for inherently not codable concepts without codable children: Secondary | ICD-10-CM | POA: Diagnosis not present

## 2013-02-11 DIAGNOSIS — J189 Pneumonia, unspecified organism: Secondary | ICD-10-CM | POA: Diagnosis not present

## 2013-02-12 ENCOUNTER — Other Ambulatory Visit: Payer: Medicare Other

## 2013-02-12 ENCOUNTER — Encounter: Payer: Self-pay | Admitting: Internal Medicine

## 2013-02-12 ENCOUNTER — Ambulatory Visit (INDEPENDENT_AMBULATORY_CARE_PROVIDER_SITE_OTHER): Payer: Medicare Other | Admitting: Internal Medicine

## 2013-02-12 VITALS — BP 130/72 | HR 65 | Temp 98.4°F | Ht 63.0 in | Wt 155.2 lb

## 2013-02-12 DIAGNOSIS — J984 Other disorders of lung: Secondary | ICD-10-CM | POA: Diagnosis not present

## 2013-02-12 NOTE — Assessment & Plan Note (Addendum)
Right upper lobe lung cavity /right lower lobe superior segment lung cavity   - can be from either resolving pneumonia patch or lung cancer Do blood test oncimmune for lung cancer proteins Do full PFT breathing test Do CT chest with contrast in 3 weeks (7 weeks from first scan) Return to followup in 3 weeks after above Call or come sooner if there are problems

## 2013-02-12 NOTE — Patient Instructions (Addendum)
Right upper lobe lung cavity /right lower lobe superior segment lung cavity   - can be from either resolving pneumonia patch or lung cancer Do blood test oncimmune for lung cancer proteins Do full PFT breathing test Do CT chest with contrast in 3 weeks Return to followup in 3 weeks after above Call or come sooner if there are problems

## 2013-02-12 NOTE — Progress Notes (Signed)
Subjective:    Patient ID: Jennifer Warner, female    DOB: 1934/05/21, 79 y.o.   MRN: [id number] PCP [name], MD   HPI  IOV 02/12/2013    79 year old female referred for question of right upper lobe cavitary lesion  At baseline she is a nonsmoker without history of passive smoking. She was admitted to the hospital on 01/07/2013 with a history of low-grade fever, decreased appetite and right-sided chest pain. Chest x-ray revealed right upper lobe opacity. CT scan of the chest on the same day showed right centimeter by 3 cm x 2 cm triangular-shaped heterogeneous opacity in the superior segment of the right lower lobe along with a 2.6 cm left thyroid mass. She was discharged 2 days later 01/09/2013. Since returning home her symptoms are improved and she is back at baseline health up perhaps is some mild cough. Her followup chest x-ray marked 02/07/2013 showed 3.2 x 2.4 cm right upper lobe cavitary lesion without air-fluid levels but with a thick wall in the same spot as the density previously noted. Therefore, she is been referred here   Past Medical History  Diagnosis Date  . CAD (coronary artery disease)   . Diabetes   . Reflux   . Acute thyroiditis   . Polycystic ovaries   . Pure hypercholesterolemia   . Hypertriglyceridemia   . Essential hypertension, benign   . Urinary tract infection, site not specified   . Postsurgical aortocoronary bypass status   . Other secondary thrombocytopenia   . Hyperkalemia   . S/P CABG x 2      Family History  Problem Relation Age of Onset  . Liver disease Mother 107    deceased  . Cancer Brother     x3  . Stroke Brother      History   Social History  . Marital Status: Married    Spouse Name: N/A    Number of Children: 2  . Years of Education: N/A   Occupational History  . retired- Wellsite geologist     2010   Social History Main Topics  . Smoking status: Never Smoker   . Smokeless tobacco: Not on file  . Alcohol Use:  No  . Drug Use: No  . Sexually Active: Not on file   Other Topics Concern  . Not on file   Social History Narrative  . No narrative on file     No Known Allergies   Outpatient Prescriptions Prior to Visit  Medication Sig Dispense Refill  . aspirin 81 MG chewable tablet Chew 81 mg by mouth daily.      Marland Kitchen atorvastatin (LIPITOR) 10 MG tablet Take 10 mg by mouth every evening.       . diphenhydramine-acetaminophen (TYLENOL PM) 25-500 MG TABS Take 2 tablets by mouth at bedtime.      . fenofibrate (TRICOR) 145 MG tablet Take 145 mg by mouth every evening.       Marland Kitchen LANTUS SOLOSTAR 100 UNIT/ML injection Inject 20 Units into the skin at bedtime.       Marland Kitchen losartan (COZAAR) 100 MG tablet Take 100 mg by mouth daily.       . metoprolol tartrate (LOPRESSOR) 25 MG tablet Take 25 mg by mouth 2 (two) times daily.       Marland Kitchen omeprazole (PRILOSEC) 20 MG capsule Take 20 mg by mouth every evening.       . hydrochlorothiazide (MICROZIDE) 12.5 MG capsule Take 12.5 mg by mouth daily.       Marland Kitchen  HYDROcodone-acetaminophen (NORCO/VICODIN) 5-325 MG per tablet Take 1-2 tablets by mouth every 4 (four) hours as needed.  30 tablet  0  . levofloxacin (LEVAQUIN) 750 MG tablet Take 1 tablet (750 mg total) by mouth daily.  8 tablet  0   No facility-administered medications prior to visit.        Review of Systems  Constitutional: Negative for fever and unexpected weight change.  HENT: Negative for ear pain, nosebleeds, congestion, sore throat, rhinorrhea, sneezing, trouble swallowing, dental problem, postnasal drip and sinus pressure.   Eyes: Negative for redness and itching.  Respiratory: Positive for cough and shortness of breath. Negative for chest tightness and wheezing.   Cardiovascular: Negative for palpitations and leg swelling.  Gastrointestinal: Negative for nausea and vomiting.  Genitourinary: Negative for dysuria.  Musculoskeletal: Negative for joint swelling.  Skin: Negative for rash.  Neurological:  Negative for headaches.  Hematological: Does not bruise/bleed easily.  Psychiatric/Behavioral: Negative for dysphoric mood. The patient is not nervous/anxious.        Objective:   Physical Exam  Vitals reviewed. Constitutional: She is oriented to person, place, and time. She appears well-developed and well-nourished. No distress.  HENT:  Head: Normocephalic and atraumatic.  Right Ear: External ear normal.  Left Ear: External ear normal.  Mouth/Throat: Oropharynx is clear and moist. No oropharyngeal exudate.  Eyes: Conjunctivae and EOM are normal. Pupils are equal, round, and reactive to light. Right eye exhibits no discharge. Left eye exhibits no discharge. No scleral icterus.  Neck: Normal range of motion. Neck supple. No JVD present. No tracheal deviation present. No thyromegaly present.  Cardiovascular: Normal rate, regular rhythm, normal heart sounds and intact distal pulses.  Exam reveals no gallop and no friction rub.   No murmur heard. Pulmonary/Chest: Effort normal and breath sounds normal. No respiratory distress. She has no wheezes. She has no rales. She exhibits no tenderness.  Abdominal: Soft. Bowel sounds are normal. She exhibits no distension and no mass. There is no tenderness. There is no rebound and no guarding.  Musculoskeletal: Normal range of motion. She exhibits no edema and no tenderness.  Lymphadenopathy:    She has no cervical adenopathy.  Neurological: She is alert and oriented to person, place, and time. She has normal reflexes. No cranial nerve deficit. She exhibits normal muscle tone. Coordination normal.  Skin: Skin is warm and dry. No rash noted. She is not diaphoretic. No erythema. No pallor.  Psychiatric: She has a normal mood and affect. Her behavior is normal. Judgment and thought content normal.          Assessment & Plan:

## 2013-02-19 ENCOUNTER — Telehealth: Payer: Self-pay | Admitting: Internal Medicine

## 2013-02-19 NOTE — Telephone Encounter (Signed)
Jennifer Hale. [name] had onc immune test for lung cancer antigens 07/05/46. All 7 are negative suggesting low prob for cancer but keep in mind this is a specific test more than a sensitive test  Will await CT hest 03/05/13   Dr. Kalman Shan, M.D., [facility].C.P Pulmonary and Critical Care Medicine Staff Physician [facility] [place] [facility] Critical [facility] Pager: [phone], If no answer or between  15:00h - 7:00h: call [phone]  02/19/2013 5:40 PM

## 2013-03-05 ENCOUNTER — Ambulatory Visit (INDEPENDENT_AMBULATORY_CARE_PROVIDER_SITE_OTHER)
Admission: RE | Admit: 2013-03-05 | Discharge: 2013-03-05 | Disposition: A | Discharge status: Home / Self Care | Payer: Medicare Other | Source: Ambulatory Visit | Attending: Internal Medicine | Admitting: Internal Medicine

## 2013-03-05 DIAGNOSIS — J984 Other disorders of lung: Secondary | ICD-10-CM

## 2013-03-05 DIAGNOSIS — R911 Solitary pulmonary nodule: Secondary | ICD-10-CM | POA: Diagnosis not present

## 2013-03-05 MED ORDER — IOHEXOL 300 MG/ML  SOLN
80.0000 mL | Freq: Once | INTRAMUSCULAR | Status: AC | PRN
  Administered 2013-03-05: 80 mL via INTRAVENOUS

## 2013-03-13 NOTE — Telephone Encounter (Signed)
Pt aware. Jennifer Castillo, CMA  

## 2013-04-01 ENCOUNTER — Ambulatory Visit (INDEPENDENT_AMBULATORY_CARE_PROVIDER_SITE_OTHER): Payer: Medicare Other | Admitting: Internal Medicine

## 2013-04-01 VITALS — BP 118/64 | HR 65 | Temp 98.6°F

## 2013-04-01 DIAGNOSIS — R05 Cough: Secondary | ICD-10-CM

## 2013-04-01 DIAGNOSIS — R053 Chronic cough: Secondary | ICD-10-CM

## 2013-04-01 DIAGNOSIS — J984 Other disorders of lung: Secondary | ICD-10-CM | POA: Diagnosis not present

## 2013-04-01 DIAGNOSIS — R911 Solitary pulmonary nodule: Secondary | ICD-10-CM | POA: Diagnosis not present

## 2013-04-01 LAB — PULMONARY FUNCTION TEST

## 2013-04-01 NOTE — Progress Notes (Signed)
PFT done today. 

## 2013-04-01 NOTE — Patient Instructions (Addendum)
#  Lung nodule Right side  - all evidence pointing to infection  - to be on safe side have CT chest 3rd week aug 2015  #Mild cough  - we agreed to watch this clinically    #Followup After ct chest in august 2014

## 2013-04-01 NOTE — Progress Notes (Signed)
Subjective:    Patient ID: Jennifer Warner, female    DOB: Oct 22, 1933, 79 y.o.   MRN: [id number]  HPI OV 02/12/2013   79 year old female referred for question of right upper lobe cavitary lesion  At baseline she is a nonsmoker without history of passive smoking. She was admitted to the hospital on 01/07/2013 with a history of low-grade fever, decreased appetite and right-sided chest pain. Chest x-ray revealed right upper lobe opacity. CT scan of the chest on the same day showed right centimeter by 3 cm x 2 cm triangular-shaped heterogeneous opacity in the superior segment of the right lower lobe along with a 2.6 cm left thyroid mass. She was discharged 2 days later 01/09/2013. Since returning home her symptoms are improved and she is back at baseline health up perhaps is some mild cough. Her followup chest x-ray marked 02/07/2013 showed 3.2 x 2.4 cm right upper lobe cavitary lesion without air-fluid levels but with a thick wall in the same spot as the density previously noted. Therefore, she is been referred here   REC Right upper lobe lung cavity /right lower lobe superior segment lung cavity   - can be from either resolving pneumonia patch or lung cancer Do blood test oncimmune for lung cancer proteins Do full PFT breathing test Do CT chest with contrast in 3 weeks Return to followup in 3 weeks after above Call or come sooner if there are problems   OV 04/01/2013 Followup lung mass; right upper lobe cavitary lesion. Admitted for the same March 2014 with infectious symptomatology. Lifelong nonsmoker but with a history of passive smoking. Her to review symptoms and lab results   Overall feeling well. Nearly asymptomatic. DEnies dyspnea but has mild cough that is rated 2 of 10 in severity. Cough is not as bad when she was admitted. Also is different from when she was hospitalized. Mostly cough in day but some at night but never wakes her up. Cough does not get worse when she lies down.  Innsidious onset. Several years duration. There might be associated post nasal drainage; not on regular Rx. Sinus drainage does get worse in allergy season. Also has GERD; take ppi at night. She does NOT want any Rx because cough is really mild      Jennifer Warner had onc immune test for lung cancer antigens 02/12/2013.   - All 7 are negative suggesting low prob for cancer   CT chest 03/05/13 IMPRESSION:  1. No acute cardiopulmonary abnormalities.  2. Peripheral nodule within the superior segment of the right  lower lobe is identified in an area of recent ground-glass  attenuation and subsequent cavitation. When compared with chest  radiograph from 02/07/2013 the cavity has resolved and this has  decreased in size. Findings favor sequelae of inflammation,  infection or infarct. Advise continued follow-up to ensure  resolution of this abnormality.  Original Report Authenticated By: Signa Kell, M.D.   PFt 04/01/13  - Normal  - fev1 1.7L/109%, FVC 2.4L/1015, R 80, TLC 90%, DLCO 16.7/82%     Past, Family, Social reviewed: no change since last visit    Review of Systems  Constitutional: Negative for fever and unexpected weight change.  HENT: Negative for ear pain, nosebleeds, congestion, sore throat, rhinorrhea, sneezing, trouble swallowing, dental problem, postnasal drip and sinus pressure.   Eyes: Negative for redness and itching.  Respiratory: Negative for cough, chest tightness, shortness of breath and wheezing.   Cardiovascular: Negative for palpitations and leg swelling.  Gastrointestinal: Negative  for nausea and vomiting.  Genitourinary: Negative for dysuria.  Musculoskeletal: Negative for joint swelling.  Skin: Negative for rash.  Neurological: Negative for headaches.  Hematological: Does not bruise/bleed easily.  Psychiatric/Behavioral: Negative for dysphoric mood. The patient is not nervous/anxious.        Objective:   Physical Exam Physical Exam  Vitals  reviewed. Constitutional: She is oriented to person, place, and time. She appears well-developed and well-nourished. No distress.  HENT:  Head: Normocephalic and atraumatic.  Right Ear: External ear normal.  Left Ear: External ear normal.  Mouth/Throat: Oropharynx is clear and moist. No oropharyngeal exudate.  Eyes: Conjunctivae and EOM are normal. Pupils are equal, round, and reactive to light. Right eye exhibits no discharge. Left eye exhibits no discharge. No scleral icterus.  Neck: Normal range of motion. Neck supple. No JVD present. No tracheal deviation present. No thyromegaly present.  Cardiovascular: Normal rate, regular rhythm, normal heart sounds and intact distal pulses.  Exam reveals no gallop and no friction rub.   No murmur heard. Pulmonary/Chest: Effort normal and breath sounds normal. No respiratory distress. She has no wheezes. She has no rales. She exhibits no tenderness.  Abdominal: Soft. Bowel sounds are normal. She exhibits no distension and no mass. There is no tenderness. There is no rebound and no guarding.  Musculoskeletal: Normal range of motion. She exhibits no edema and no tenderness.  Lymphadenopathy:    She has no cervical adenopathy.  Neurological: She is alert and oriented to person, place, and time. She has normal reflexes. No cranial nerve deficit. She exhibits normal muscle tone. Coordination normal.  Skin: Skin is warm and dry. No rash noted. She is not diaphoretic. No erythema. No pallor.  Psychiatric: She has a normal mood and affect. Her behavior is normal. Judgment and thought content normal.          Assessment & Plan:

## 2013-04-02 ENCOUNTER — Encounter: Payer: Self-pay | Admitting: Internal Medicine

## 2013-04-02 DIAGNOSIS — R05 Cough: Secondary | ICD-10-CM | POA: Insufficient documentation

## 2013-04-02 NOTE — Assessment & Plan Note (Signed)
#  Lung nodule Right side  - all evidence pointing to infection  - to be on safe side have CT chest 3rd week aug 2015    #Followup After ct chest in august 2014

## 2013-04-02 NOTE — Assessment & Plan Note (Signed)
Mild chronic cough related probably to sinus drainage and acid reflux. This is so mild that she's not desirous of any treatment. So we're going to expectantly follow this

## 2013-04-17 ENCOUNTER — Ambulatory Visit (INDEPENDENT_AMBULATORY_CARE_PROVIDER_SITE_OTHER): Payer: Medicare Other | Admitting: Cardiology

## 2013-04-17 ENCOUNTER — Encounter: Payer: Self-pay | Admitting: Cardiology

## 2013-04-17 VITALS — BP 132/80 | HR 74 | Ht 63.0 in | Wt 154.8 lb

## 2013-04-17 DIAGNOSIS — E781 Pure hyperglyceridemia: Secondary | ICD-10-CM | POA: Diagnosis not present

## 2013-04-17 DIAGNOSIS — I2510 Atherosclerotic heart disease of native coronary artery without angina pectoris: Secondary | ICD-10-CM | POA: Diagnosis not present

## 2013-04-17 DIAGNOSIS — Z951 Presence of aortocoronary bypass graft: Secondary | ICD-10-CM

## 2013-04-17 DIAGNOSIS — I10 Essential (primary) hypertension: Secondary | ICD-10-CM

## 2013-04-17 MED ORDER — OMEPRAZOLE 20 MG PO CPDR: 20.0000 mg | DELAYED_RELEASE_CAPSULE | Freq: Every evening | ORAL | Status: DC

## 2013-04-17 MED ORDER — METOPROLOL TARTRATE 25 MG PO TABS: 25.0000 mg | ORAL_TABLET | Freq: Two times a day (BID) | ORAL | Status: DC

## 2013-04-17 MED ORDER — LOSARTAN POTASSIUM 100 MG PO TABS: 100.0000 mg | ORAL_TABLET | Freq: Every day | ORAL | Status: DC

## 2013-04-17 MED ORDER — FENOFIBRATE 145 MG PO TABS: 145.0000 mg | ORAL_TABLET | Freq: Every evening | ORAL | Status: DC

## 2013-04-17 MED ORDER — ATORVASTATIN CALCIUM 10 MG PO TABS: 10.0000 mg | ORAL_TABLET | Freq: Every evening | ORAL | Status: DC

## 2013-04-17 NOTE — Patient Instructions (Signed)
Continue your current therapy  I will see you in 6 months.   

## 2013-04-17 NOTE — Progress Notes (Signed)
   Jennifer Warner Date of Birth: October 17, 1934 Medical Record #[id number]  History of Present Illness: Jennifer Warner is seen for followup today. She  has a history of hypertension and dyslipidemia. She also has a history of diabetes. In 2009 she presented with new onset of angina. A Myoview study was abnormal. This led to cardiac catheterization which demonstrated a critical ostial LAD stenosis. She  underwent two-vessel coronary bypass surgery in [place] with an LIMA graft to the LAD and a saphenous vein graft to the RCA. She has done very well since her surgery without any recurrent symptoms of angina. On followup today she has done very well from a cardiac standpoint. She denies any chest pain or shortness of breath. She was hospitalized in March with pneumonia but has recovered fully from this.  Current Outpatient Prescriptions on File Prior to Visit  Medication Sig Dispense Refill  . aspirin 81 MG chewable tablet Chew 81 mg by mouth daily.      . diphenhydramine-acetaminophen (TYLENOL PM) 25-500 MG TABS Take 2 tablets by mouth at bedtime.      Marland Kitchen LANTUS SOLOSTAR 100 UNIT/ML injection Inject 20 Units into the skin at bedtime.        No current facility-administered medications on file prior to visit.    Allergies  Allergen Reactions  . Levofloxacin     Past Medical History  Diagnosis Date  . CAD (coronary artery disease)   . Diabetes   . Reflux   . Acute thyroiditis   . Polycystic ovaries   . Pure hypercholesterolemia   . Hypertriglyceridemia   . Essential hypertension, benign   . Urinary tract infection, site not specified   . Postsurgical aortocoronary bypass status   . Other secondary thrombocytopenia   . Hyperkalemia   . S/P CABG x 2   . PNA (pneumonia) 3/14    Past Surgical History  Procedure Laterality Date  . Cholecystectomy    . S/p cabg  8/09    [name] to LAD, SVG to PDA    History  Smoking status  . Never Smoker   Smokeless tobacco  . Not on  file    History  Alcohol Use No    Family History  Problem Relation Age of Onset  . Liver disease Mother 33    deceased  . Cancer Brother     x3  . Stroke Brother     Review of Systems: As noted in history of present illness  All other systems were reviewed and are negative.  Physical Exam: BP 132/80  Pulse 74  Ht 5\' 3"  (1.6 m)  Wt 154 lb 12.8 oz (70.217 kg)  BMI 27.43 kg/m2  SpO2 97% She is very pleasant white female in no acute distress. HEENT: Normal Neck: No JVD or bruits. Lungs: Clear Cardiovascular: Regular rate and rhythm without gallop, murmur, or click. Abdomen: Soft and nontender. No masses or bruits. Extremities: No cyanosis or edema. Pedal pulses are 2+ and symmetric. Skin: Warm and dry Neuro: Alert and oriented x3. Cranial nerves II through XII are intact.  LABORATORY DATA:  Assessment / Plan: 1. Coronary disease status post 2 vessel CABG in August of 2009. She is asymptomatic. ECG is normal. We'll continue with her current medical therapy and risk factor modification. I'll followup again in 6 months.  2. Hyperlipidemia. Continue TriCor and Lipitor.  3. Diabetes mellitus type 2.  3. Chronic kidney disease stage III. Patient is on an ARB.

## 2013-06-04 ENCOUNTER — Other Ambulatory Visit: Payer: Medicare Other

## 2013-06-26 DIAGNOSIS — I10 Essential (primary) hypertension: Secondary | ICD-10-CM | POA: Diagnosis not present

## 2013-06-26 DIAGNOSIS — I2510 Atherosclerotic heart disease of native coronary artery without angina pectoris: Secondary | ICD-10-CM | POA: Diagnosis not present

## 2013-07-23 DIAGNOSIS — L259 Unspecified contact dermatitis, unspecified cause: Secondary | ICD-10-CM | POA: Diagnosis not present

## 2013-12-05 ENCOUNTER — Ambulatory Visit: Payer: Medicare Other | Admitting: Cardiology

## 2014-01-08 DIAGNOSIS — I10 Essential (primary) hypertension: Secondary | ICD-10-CM | POA: Diagnosis not present

## 2014-01-08 DIAGNOSIS — IMO0001 Reserved for inherently not codable concepts without codable children: Secondary | ICD-10-CM | POA: Diagnosis not present

## 2014-01-08 DIAGNOSIS — I2510 Atherosclerotic heart disease of native coronary artery without angina pectoris: Secondary | ICD-10-CM | POA: Diagnosis not present

## 2014-01-08 DIAGNOSIS — M25519 Pain in unspecified shoulder: Secondary | ICD-10-CM | POA: Diagnosis not present

## 2014-01-13 ENCOUNTER — Ambulatory Visit (INDEPENDENT_AMBULATORY_CARE_PROVIDER_SITE_OTHER): Payer: Medicare Other | Admitting: Cardiology

## 2014-01-13 ENCOUNTER — Encounter: Payer: Self-pay | Admitting: Cardiology

## 2014-01-13 VITALS — BP 140/80 | HR 79 | Ht 63.0 in | Wt 158.0 lb

## 2014-01-13 DIAGNOSIS — E781 Pure hyperglyceridemia: Secondary | ICD-10-CM

## 2014-01-13 DIAGNOSIS — I2510 Atherosclerotic heart disease of native coronary artery without angina pectoris: Secondary | ICD-10-CM | POA: Diagnosis not present

## 2014-01-13 DIAGNOSIS — Z951 Presence of aortocoronary bypass graft: Secondary | ICD-10-CM

## 2014-01-13 DIAGNOSIS — I10 Essential (primary) hypertension: Secondary | ICD-10-CM | POA: Diagnosis not present

## 2014-01-13 DIAGNOSIS — E119 Type 2 diabetes mellitus without complications: Secondary | ICD-10-CM

## 2014-01-13 NOTE — Patient Instructions (Signed)
We will check with Dr. Carlis Abbott about your lab work  We will schedule you for a stress nuclear study.  Continue your current medication.  I will see you in one year.

## 2014-01-13 NOTE — Progress Notes (Signed)
Jennifer Warner. Jennifer Warner Date of Birth: 12-10-1933 Medical Record #[id number]  History of Present Illness: [name] is seen for followup today. She  has a history of hypertension and dyslipidemia. She also has a history of diabetes. In 2009 she presented with new onset of angina. A Myoview study was abnormal. This led to cardiac catheterization which demonstrated a critical ostial LAD stenosis. She  underwent two-vessel coronary bypass surgery in [place] with an LIMA graft to the LAD and a saphenous vein graft to the RCA. She has done very well since her surgery without any recurrent symptoms of angina. On followup today she has done very well from a cardiac standpoint. She denies any chest pain or shortness of breath. She reports her last A1c was 7.5%. She has gained 4 lbs. Needs to exercise more.   Current Outpatient Prescriptions on File Prior to Visit  Medication Sig Dispense Refill  . aspirin 81 MG chewable tablet Chew 81 mg by mouth daily.      Marland Kitchen atorvastatin (LIPITOR) 10 MG tablet Take 1 tablet (10 mg total) by mouth every evening.  90 tablet  3  . diphenhydramine-acetaminophen (TYLENOL PM) 25-500 MG TABS Take 2 tablets by mouth at bedtime.      . fenofibrate (TRICOR) 145 MG tablet Take 1 tablet (145 mg total) by mouth every evening.  90 tablet  3  . LANTUS SOLOSTAR 100 UNIT/ML injection Inject 20 Units into the skin at bedtime.       . metoprolol tartrate (LOPRESSOR) 25 MG tablet Take 1 tablet (25 mg total) by mouth 2 (two) times daily.  180 tablet  3  . omeprazole (PRILOSEC) 20 MG capsule Take 1 capsule (20 mg total) by mouth every evening.  90 capsule  3  . losartan (COZAAR) 100 MG tablet Take 1 tablet (100 mg total) by mouth daily.  90 tablet  3   No current facility-administered medications on file prior to visit.    Allergies  Allergen Reactions  . Levofloxacin     Past Medical History  Diagnosis Date  . CAD (coronary artery disease)   . Diabetes   . Reflux   .  Acute thyroiditis   . Polycystic ovaries   . Pure hypercholesterolemia   . Hypertriglyceridemia   . Essential hypertension, benign   . Urinary tract infection, site not specified   . Postsurgical aortocoronary bypass status   . Other secondary thrombocytopenia   . Hyperkalemia   . S/P CABG x 2   . PNA (pneumonia) 3/14    Past Surgical History  Procedure Laterality Date  . Cholecystectomy    . S/p cabg  8/09    Jennifer Warner to LAD, SVG to PDA    History  Smoking status  . Never Smoker   Smokeless tobacco  . Not on file    History  Alcohol Use No    Family History  Problem Relation Age of Onset  . Liver disease Mother 73    deceased  . Cancer Brother     x3  . Stroke Brother     Review of Systems: As noted in history of present illness  All other systems were reviewed and are negative.  Physical Exam: BP 140/80  Pulse 79  Ht 5\' 3"  (1.6 m)  Wt 158 lb (71.668 kg)  BMI 28.00 kg/m2 She is very pleasant, obese white female in no acute distress. HEENT: Normal Neck: No JVD or bruits. Lungs: Clear Cardiovascular: Regular rate and rhythm, normal  S1-2, without gallop, murmur, or click. Abdomen: Soft and nontender. No masses or bruits. Extremities: No cyanosis or edema. Pedal pulses are 2+ and symmetric. Skin: Warm and dry Neuro: Alert and oriented x3. Cranial nerves II through XII are intact.  LABORATORY DATA: Ecg: NSR, normal Ecg.  Assessment / Plan: 1. Coronary disease status post 2 vessel CABG in August of 2009. She is asymptomatic. ECG is normal. We'll continue with her current medical therapy and risk factor modification. I have recommended a stress Myoview at this time.  2. Hyperlipidemia. Continue TriCor and Lipitor. Will get a copy of her most recent lab work from Dr. Carlis Warner.  3. Diabetes mellitus type 2.  3. Chronic kidney disease stage III. Patient is on an ARB.

## 2014-01-27 ENCOUNTER — Ambulatory Visit (HOSPITAL_COMMUNITY): Payer: Medicare Other | Attending: Cardiovascular Disease | Admitting: Radiology

## 2014-01-27 VITALS — BP 235/111 | HR 89 | Ht 63.0 in | Wt 157.0 lb

## 2014-01-27 DIAGNOSIS — Z951 Presence of aortocoronary bypass graft: Secondary | ICD-10-CM | POA: Insufficient documentation

## 2014-01-27 DIAGNOSIS — I2510 Atherosclerotic heart disease of native coronary artery without angina pectoris: Secondary | ICD-10-CM | POA: Diagnosis not present

## 2014-01-27 DIAGNOSIS — I10 Essential (primary) hypertension: Secondary | ICD-10-CM

## 2014-01-27 DIAGNOSIS — E781 Pure hyperglyceridemia: Secondary | ICD-10-CM

## 2014-01-27 DIAGNOSIS — E119 Type 2 diabetes mellitus without complications: Secondary | ICD-10-CM

## 2014-01-27 MED ORDER — TECHNETIUM TC 99M SESTAMIBI GENERIC - CARDIOLITE
10.0000 | Freq: Once | INTRAVENOUS | Status: AC | PRN
  Administered 2014-01-27: 10 via INTRAVENOUS

## 2014-01-27 MED ORDER — TECHNETIUM TC 99M SESTAMIBI GENERIC - CARDIOLITE
30.0000 | Freq: Once | INTRAVENOUS | Status: AC | PRN
  Administered 2014-01-27: 30 via INTRAVENOUS

## 2014-01-27 MED ORDER — REGADENOSON 0.4 MG/5ML IV SOLN
0.4000 mg | Freq: Once | INTRAVENOUS | Status: AC
  Administered 2014-01-27: 0.4 mg via INTRAVENOUS

## 2014-01-27 NOTE — Progress Notes (Signed)
[facility] [address] [place], [place] [address] [phone]    Cardiology Nuclear Med Cynithia Hakimi. Jennifer Warner is a 80 y.o. female     MRN : [id number]     DOB: 01-Feb-1934  Procedure Date: 01/27/2014  Nuclear Med Background Indication for Stress Test:  Evaluation for Ischemia and Graft Patency History:  CAD, Cath 2009, CABG 2009, CABG 2009, Echo, MPI 2009 (abn - CABG) Cardiac Risk Factors: Family History - CAD, Hypertension, IDDM,and Lipids  Symptoms:  None indicated   Nuclear Pre-Procedure Caffeine/Decaff Intake:  None >12 hrs NPO After: 9:30pm   Lungs:  clear O2 Sat: 97% on room air. IV 0.9% NS with Angio Cath:  22g  IV Site: R Hand x 1, tolerated well IV Started by:  Irven Baltimore, RN  Chest Size (in):  38 Cup Size: C  Height: 5\' 3"  (1.6 m)  Weight:  157 lb (71.215 kg)  BMI:  Body mass index is 27.82 kg/(m^2). Tech Comments:  Held Lopressor x 24 hrs. Full dose Lantus Insulin last night, no insulin today. Fasting CBG was 122 at 0930 today. Irven Baltimore, RN.    Nuclear Med Study 1 or 2 day study: 1 day  Stress Test Type:  [name]  Reading MD: N/A  Order Authorizing Provider:  Peter Martinique, MD  Resting Radionuclide: Technetium 48m Sestamibi  Resting Radionuclide Dose: 10.8 mCi   Stress Radionuclide:  Technetium 39m Sestamibi  Stress Radionuclide Dose: 33.0 mCi           Stress Protocol Rest HR: 89 Stress HR: 111  Rest BP: 235/111 Stress BP: 202/68  Exercise Time (min): n/a METS: n/a           Dose of Adenosine (mg):  n/a Dose of Lexiscan: 0.4 mg  Dose of Atropine (mg): n/a Dose of Dobutamine: n/a mcg/kg/min (at max HR)  Stress Test Technologist: Glade Lloyd, BS-ES  Nuclear Technologist:  Charlton Amor, CNMT     Rest Procedure:  Myocardial perfusion imaging was performed at rest 45 minutes following the intravenous administration of Technetium 41m Sestamibi. Rest ECG: NSR-RBBB  Stress Procedure:  The patient received IV  Lexiscan 0.4 mg over 15-seconds.  Technetium 58m Sestamibi injected at 30-seconds.  Quantitative spect images were obtained after a 45 minute delay.  During the infusion of Lexiscan, the patient complained of feeling "a little odd", cough and the back of her neck hurting.  These symptoms began to subside in recovery.  Stress ECG: No significant change from baseline ECG  QPS Raw Data Images:  Normal; no motion artifact; normal heart/lung ratio. Stress Images:  There is decreased uptake in the septum. Rest Images:  There is decreased uptake in the septum. Subtraction (SDS):  No evidence of ischemia. Transient Ischemic Dilatation (Normal <1.22):  1.37 Lung/Heart Ratio (Normal <0.45):  0.29  Quantitative Gated Spect Images QGS EDV:  58 ml QGS ESV:  20 ml  Impression Exercise Capacity:  Lexiscan with no exercise. BP Response:  Hypertensive blood pressure response. Clinical Symptoms:  No significant symptoms noted. ECG Impression:  No significant ECG changes with Lexiscan. Comparison with Prior Nuclear Study: Prior study in 2009 was + for ischemia -> subsequent CABG  Overall Impression:  Low risk stress nuclear study without reversible ischemia. Fixed basal to mid septal defect.  LV Ejection Fraction: 66%.  LV Wall Motion:  Septal hypokinesis  [name], MD, [place] Certified in Nuclear Cardiology Attending Cardiologist [facility]

## 2014-04-22 ENCOUNTER — Other Ambulatory Visit: Payer: Self-pay | Admitting: Cardiology

## 2014-05-13 ENCOUNTER — Other Ambulatory Visit: Payer: Self-pay | Admitting: Cardiology

## 2014-05-20 ENCOUNTER — Other Ambulatory Visit: Payer: Self-pay | Admitting: Cardiology

## 2014-07-24 DIAGNOSIS — I10 Essential (primary) hypertension: Secondary | ICD-10-CM | POA: Diagnosis not present

## 2014-07-24 DIAGNOSIS — E780 Pure hypercholesterolemia: Secondary | ICD-10-CM | POA: Diagnosis not present

## 2014-07-24 DIAGNOSIS — E119 Type 2 diabetes mellitus without complications: Secondary | ICD-10-CM | POA: Diagnosis not present

## 2014-07-24 DIAGNOSIS — E559 Vitamin D deficiency, unspecified: Secondary | ICD-10-CM | POA: Diagnosis not present

## 2014-07-24 DIAGNOSIS — Z0001 Encounter for general adult medical examination with abnormal findings: Secondary | ICD-10-CM | POA: Diagnosis not present

## 2014-07-24 DIAGNOSIS — M2550 Pain in unspecified joint: Secondary | ICD-10-CM | POA: Diagnosis not present

## 2014-08-22 ENCOUNTER — Other Ambulatory Visit: Payer: Self-pay | Admitting: Cardiology

## 2014-10-25 ENCOUNTER — Other Ambulatory Visit: Payer: Self-pay | Admitting: Cardiology

## 2014-11-20 DIAGNOSIS — I2510 Atherosclerotic heart disease of native coronary artery without angina pectoris: Secondary | ICD-10-CM | POA: Diagnosis not present

## 2014-11-20 DIAGNOSIS — E119 Type 2 diabetes mellitus without complications: Secondary | ICD-10-CM | POA: Diagnosis not present

## 2014-11-20 DIAGNOSIS — M109 Gout, unspecified: Secondary | ICD-10-CM | POA: Diagnosis not present

## 2014-11-20 DIAGNOSIS — E785 Hyperlipidemia, unspecified: Secondary | ICD-10-CM | POA: Diagnosis not present

## 2014-11-20 DIAGNOSIS — I10 Essential (primary) hypertension: Secondary | ICD-10-CM | POA: Diagnosis not present

## 2014-12-02 ENCOUNTER — Other Ambulatory Visit: Payer: Self-pay

## 2014-12-02 MED ORDER — LOSARTAN POTASSIUM 100 MG PO TABS: 100.0000 mg | ORAL_TABLET | Freq: Every day | ORAL | Status: DC

## 2015-02-18 ENCOUNTER — Other Ambulatory Visit: Payer: Self-pay | Admitting: Cardiology

## 2015-02-20 ENCOUNTER — Ambulatory Visit: Payer: Medicare Other | Admitting: Cardiology

## 2015-04-01 ENCOUNTER — Encounter: Payer: Self-pay | Admitting: *Deleted

## 2015-04-03 ENCOUNTER — Encounter: Payer: Self-pay | Admitting: Cardiology

## 2015-04-03 ENCOUNTER — Ambulatory Visit (INDEPENDENT_AMBULATORY_CARE_PROVIDER_SITE_OTHER): Payer: Medicare Other | Admitting: Cardiology

## 2015-04-03 VITALS — BP 152/72 | HR 58 | Ht 63.0 in | Wt 152.1 lb

## 2015-04-03 DIAGNOSIS — Z951 Presence of aortocoronary bypass graft: Secondary | ICD-10-CM | POA: Diagnosis not present

## 2015-04-03 DIAGNOSIS — I25708 Atherosclerosis of coronary artery bypass graft(s), unspecified, with other forms of angina pectoris: Secondary | ICD-10-CM

## 2015-04-03 DIAGNOSIS — I10 Essential (primary) hypertension: Secondary | ICD-10-CM

## 2015-04-03 NOTE — Patient Instructions (Signed)
We will get a copy of your lab work from Dr. Carlis Abbott  I will see you in one year.

## 2015-04-03 NOTE — Progress Notes (Signed)
Jennifer Warner. [name] Date of Birth: May 30, 1934 Medical Record #[id number]  History of Present Illness: [name] is seen for followup of CAD. She  has a history of hypertension and dyslipidemia. She also has a history of diabetes- on insulin. In 2009 she presented with new onset of angina. A Myoview study was abnormal. This led to cardiac catheterization which demonstrated a critical ostial LAD stenosis. She  underwent two-vessel coronary bypass surgery in [place] with an LIMA graft to the LAD and a saphenous vein graft to the RCA. She had a normal Myoview study in April 2015.  She has done very well over the past year without recurrent symptoms of angina. She denies any chest pain or shortness of breath. She reports her last A1c was about 7%. She has lost 6  lbs. [facility] She reports BP readings at home have been good.  Current Outpatient Prescriptions on File Prior to Visit  Medication Sig Dispense Refill  . aspirin 81 MG chewable tablet Chew 81 mg by mouth daily.    . diphenhydramine-acetaminophen (TYLENOL PM) 25-500 MG TABS Take 2 tablets by mouth at bedtime.    . fenofibrate (TRICOR) 145 MG tablet TAKE 1 TABLET BY MOUTH EVERY EVENING 90 tablet 0  . LANTUS SOLOSTAR 100 UNIT/ML injection Inject 20 Units into the skin at bedtime.     Marland Kitchen losartan (COZAAR) 100 MG tablet Take 1 tablet (100 mg total) by mouth daily. 90 tablet 1  . metoprolol tartrate (LOPRESSOR) 25 MG tablet Take 1 tablet (25 mg total) by mouth 2 (two) times daily. 180 tablet 3  . omeprazole (PRILOSEC) 20 MG capsule TAKE 1 CAPSULE BY MOUTH EVERY EVENING AS DIRECTED 90 capsule 0   No current facility-administered medications on file prior to visit.    Allergies  Allergen Reactions  . Levofloxacin     Past Medical History  Diagnosis Date  . CAD (coronary artery disease)   . Diabetes   . Reflux   . Acute thyroiditis   . Polycystic ovaries   . Pure hypercholesterolemia   .  Hypertriglyceridemia   . Essential hypertension, benign   . Urinary tract infection, site not specified   . Postsurgical aortocoronary bypass status   . Other secondary thrombocytopenia   . Hyperkalemia   . S/P CABG x 2   . PNA (pneumonia) 3/14    Past Surgical History  Procedure Laterality Date  . Cholecystectomy    . S/p cabg  8/09    Romualdo Bolk to LAD, SVG to PDA    History  Smoking status  . Never Smoker   Smokeless tobacco  . Not on file    History  Alcohol Use No    Family History  Problem Relation Age of Onset  . Liver disease Mother 73    deceased  . Cancer Brother     x3  . Stroke Brother     Review of Systems: As noted in history of present illness  All other systems were reviewed and are negative.  Physical Exam: BP 152/72 mmHg  Pulse 58  Ht 5\' 3"  (1.6 m)  Wt 68.992 kg (152 lb 1.6 oz)  BMI 26.95 kg/m2 She is very pleasant, obese white female in no acute distress. HEENT: Normal Neck: No JVD or bruits. Lungs: Clear Cardiovascular: Regular rate and rhythm, normal S1-2, without gallop, murmur, or click. Abdomen: Soft and nontender. No masses or bruits. Extremities: No cyanosis or edema. Pedal pulses are  2+ and symmetric. Skin: Warm and dry Neuro: Alert and oriented x3. Cranial nerves II through XII are intact.  LABORATORY DATA: Ecg: today- NSR, normal Ecg. I have personally reviewed and interpreted this study.   Assessment / Plan: 1. Coronary disease status post 2 vessel CABG in August of 2009. She is asymptomatic. ECG is normal. We'll continue with her current medical therapy and risk factor modification. Myoview study normal April 2015.   2. Hyperlipidemia. Continue TriCor and Lipitor. Will get a copy of her most recent lab work from Dr. Carlis Abbott.  3. Diabetes mellitus type 2.  3. Chronic kidney disease stage III. Patient is on an ARB.  4. HTN. BP elevated today but she states this is high for her. Will continue to monitor at home.

## 2015-04-06 ENCOUNTER — Encounter: Payer: Self-pay | Admitting: Cardiology

## 2015-04-23 ENCOUNTER — Other Ambulatory Visit: Payer: Self-pay | Admitting: Cardiology

## 2015-04-23 NOTE — Telephone Encounter (Signed)
REFILL 

## 2015-05-17 ENCOUNTER — Other Ambulatory Visit: Payer: Self-pay | Admitting: Cardiology

## 2015-06-15 ENCOUNTER — Other Ambulatory Visit: Payer: Self-pay | Admitting: Cardiology

## 2015-08-31 DIAGNOSIS — M1990 Unspecified osteoarthritis, unspecified site: Secondary | ICD-10-CM | POA: Diagnosis not present

## 2015-08-31 DIAGNOSIS — M2550 Pain in unspecified joint: Secondary | ICD-10-CM | POA: Diagnosis not present

## 2015-08-31 DIAGNOSIS — E559 Vitamin D deficiency, unspecified: Secondary | ICD-10-CM | POA: Diagnosis not present

## 2015-08-31 DIAGNOSIS — I2510 Atherosclerotic heart disease of native coronary artery without angina pectoris: Secondary | ICD-10-CM | POA: Diagnosis not present

## 2015-08-31 DIAGNOSIS — I10 Essential (primary) hypertension: Secondary | ICD-10-CM | POA: Diagnosis not present

## 2015-08-31 DIAGNOSIS — M109 Gout, unspecified: Secondary | ICD-10-CM | POA: Diagnosis not present

## 2015-08-31 DIAGNOSIS — E785 Hyperlipidemia, unspecified: Secondary | ICD-10-CM | POA: Diagnosis not present

## 2015-08-31 DIAGNOSIS — E119 Type 2 diabetes mellitus without complications: Secondary | ICD-10-CM | POA: Diagnosis not present

## 2015-12-31 DIAGNOSIS — I10 Essential (primary) hypertension: Secondary | ICD-10-CM | POA: Diagnosis not present

## 2015-12-31 DIAGNOSIS — E559 Vitamin D deficiency, unspecified: Secondary | ICD-10-CM | POA: Diagnosis not present

## 2015-12-31 DIAGNOSIS — E119 Type 2 diabetes mellitus without complications: Secondary | ICD-10-CM | POA: Diagnosis not present

## 2016-03-27 ENCOUNTER — Other Ambulatory Visit: Payer: Self-pay | Admitting: Cardiology

## 2016-03-28 NOTE — Telephone Encounter (Signed)
Rx request sent to pharmacy.  

## 2016-04-01 ENCOUNTER — Telehealth: Payer: Self-pay | Admitting: Cardiology

## 2016-04-01 NOTE — Telephone Encounter (Signed)
See previous 04/01/16 note.

## 2016-04-01 NOTE — Telephone Encounter (Signed)
New Message  Pt calling to speak with RN. Pt wants to schedule an appt. I let pt know we do not have any avail. appt with Dr. Martinique and offered her an appt with PA. Pt refused the PA appt and only want to see the Doctor. Please call back to advise if you are able to accommodate.

## 2016-04-21 DIAGNOSIS — I10 Essential (primary) hypertension: Secondary | ICD-10-CM | POA: Diagnosis not present

## 2016-04-21 DIAGNOSIS — E119 Type 2 diabetes mellitus without complications: Secondary | ICD-10-CM | POA: Diagnosis not present

## 2016-04-21 DIAGNOSIS — R51 Headache: Secondary | ICD-10-CM | POA: Diagnosis not present

## 2016-04-21 DIAGNOSIS — I2510 Atherosclerotic heart disease of native coronary artery without angina pectoris: Secondary | ICD-10-CM | POA: Diagnosis not present

## 2016-05-02 ENCOUNTER — Observation Stay (HOSPITAL_COMMUNITY)
Admission: EM | Admit: 2016-05-02 | Discharge: 2016-05-04 | Disposition: A | Discharge status: Home / Self Care | Payer: Medicare Other | Attending: Cardiology | Admitting: Cardiology

## 2016-05-02 ENCOUNTER — Encounter (HOSPITAL_COMMUNITY): Payer: Self-pay | Admitting: Emergency Medicine

## 2016-05-02 ENCOUNTER — Telehealth: Payer: Self-pay | Admitting: Cardiology

## 2016-05-02 ENCOUNTER — Emergency Department (HOSPITAL_COMMUNITY): Payer: Medicare Other

## 2016-05-02 DIAGNOSIS — E7800 Pure hypercholesterolemia, unspecified: Secondary | ICD-10-CM | POA: Insufficient documentation

## 2016-05-02 DIAGNOSIS — R0609 Other forms of dyspnea: Secondary | ICD-10-CM

## 2016-05-02 DIAGNOSIS — E1122 Type 2 diabetes mellitus with diabetic chronic kidney disease: Secondary | ICD-10-CM | POA: Insufficient documentation

## 2016-05-02 DIAGNOSIS — Z7982 Long term (current) use of aspirin: Secondary | ICD-10-CM | POA: Insufficient documentation

## 2016-05-02 DIAGNOSIS — N183 Chronic kidney disease, stage 3 (moderate): Secondary | ICD-10-CM | POA: Diagnosis not present

## 2016-05-02 DIAGNOSIS — E785 Hyperlipidemia, unspecified: Secondary | ICD-10-CM

## 2016-05-02 DIAGNOSIS — I209 Angina pectoris, unspecified: Secondary | ICD-10-CM

## 2016-05-02 DIAGNOSIS — E781 Pure hyperglyceridemia: Secondary | ICD-10-CM | POA: Diagnosis present

## 2016-05-02 DIAGNOSIS — Z794 Long term (current) use of insulin: Secondary | ICD-10-CM | POA: Diagnosis not present

## 2016-05-02 DIAGNOSIS — K760 Fatty (change of) liver, not elsewhere classified: Secondary | ICD-10-CM | POA: Insufficient documentation

## 2016-05-02 DIAGNOSIS — I129 Hypertensive chronic kidney disease with stage 1 through stage 4 chronic kidney disease, or unspecified chronic kidney disease: Secondary | ICD-10-CM | POA: Diagnosis not present

## 2016-05-02 DIAGNOSIS — I25110 Atherosclerotic heart disease of native coronary artery with unstable angina pectoris: Secondary | ICD-10-CM | POA: Diagnosis not present

## 2016-05-02 DIAGNOSIS — R42 Dizziness and giddiness: Secondary | ICD-10-CM | POA: Diagnosis not present

## 2016-05-02 DIAGNOSIS — I25709 Atherosclerosis of coronary artery bypass graft(s), unspecified, with unspecified angina pectoris: Principal | ICD-10-CM | POA: Insufficient documentation

## 2016-05-02 DIAGNOSIS — R0602 Shortness of breath: Secondary | ICD-10-CM | POA: Diagnosis not present

## 2016-05-02 DIAGNOSIS — Z951 Presence of aortocoronary bypass graft: Secondary | ICD-10-CM

## 2016-05-02 DIAGNOSIS — R110 Nausea: Secondary | ICD-10-CM | POA: Diagnosis not present

## 2016-05-02 DIAGNOSIS — I4891 Unspecified atrial fibrillation: Secondary | ICD-10-CM | POA: Diagnosis not present

## 2016-05-02 DIAGNOSIS — I110 Hypertensive heart disease with heart failure: Secondary | ICD-10-CM | POA: Diagnosis not present

## 2016-05-02 DIAGNOSIS — I10 Essential (primary) hypertension: Secondary | ICD-10-CM | POA: Diagnosis not present

## 2016-05-02 DIAGNOSIS — I2510 Atherosclerotic heart disease of native coronary artery without angina pectoris: Secondary | ICD-10-CM | POA: Diagnosis present

## 2016-05-02 DIAGNOSIS — I200 Unstable angina: Secondary | ICD-10-CM

## 2016-05-02 DIAGNOSIS — I2582 Chronic total occlusion of coronary artery: Secondary | ICD-10-CM | POA: Diagnosis not present

## 2016-05-02 DIAGNOSIS — R55 Syncope and collapse: Secondary | ICD-10-CM | POA: Diagnosis not present

## 2016-05-02 HISTORY — DX: Personal history of other medical treatment: Z92.89

## 2016-05-02 HISTORY — DX: Type 2 diabetes mellitus without complications: E11.9

## 2016-05-02 HISTORY — DX: Pneumonia, unspecified organism: J18.9

## 2016-05-02 HISTORY — DX: Gastro-esophageal reflux disease without esophagitis: K21.9

## 2016-05-02 LAB — BASIC METABOLIC PANEL
Anion gap: 6 (ref 5–15)
BUN: 25 mg/dL — AB (ref 6–20)
CO2: 23 mmol/L (ref 22–32)
Calcium: 10.1 mg/dL (ref 8.9–10.3)
Chloride: 109 mmol/L (ref 101–111)
Creatinine, Ser: 1.19 mg/dL — ABNORMAL HIGH (ref 0.44–1.00)
GFR calc Af Amer: 49 mL/min — ABNORMAL LOW (ref >60)
GFR, EST NON AFRICAN AMERICAN: 42 mL/min — AB (ref >60)
GLUCOSE: 201 mg/dL — AB (ref 65–99)
POTASSIUM: 4.9 mmol/L (ref 3.5–5.1)
Sodium: 138 mmol/L (ref 135–145)

## 2016-05-02 LAB — CBC
HEMATOCRIT: 39 % (ref 36.0–46.0)
HEMOGLOBIN: 12.9 g/dL (ref 12.0–15.0)
MCH: 30.9 pg (ref 26.0–34.0)
MCHC: 33.1 g/dL (ref 30.0–36.0)
MCV: 93.5 fL (ref 78.0–100.0)
Platelets: 233 10*3/uL (ref 150–400)
RBC: 4.17 MIL/uL (ref 3.87–5.11)
RDW: 12.8 % (ref 11.5–15.5)
WBC: 4.2 10*3/uL (ref 4.0–10.5)

## 2016-05-02 LAB — CBG MONITORING, ED: Glucose-Capillary: 180 mg/dL — ABNORMAL HIGH (ref 65–99)

## 2016-05-02 LAB — GLUCOSE, CAPILLARY
GLUCOSE-CAPILLARY: 94 mg/dL (ref 65–99)
Glucose-Capillary: 161 mg/dL — ABNORMAL HIGH (ref 65–99)

## 2016-05-02 LAB — I-STAT TROPONIN, ED: Troponin i, poc: 0.03 ng/mL (ref 0.00–0.08)

## 2016-05-02 LAB — TROPONIN I: Troponin I: 0.03 ng/mL (ref <0.03)

## 2016-05-02 MED ORDER — NITROGLYCERIN 0.4 MG SL SUBL: 0.4000 mg | SUBLINGUAL_TABLET | SUBLINGUAL | Status: DC | PRN

## 2016-05-02 MED ORDER — ACETAMINOPHEN 325 MG PO TABS: 650.0000 mg | ORAL_TABLET | ORAL | Status: DC | PRN

## 2016-05-02 MED ORDER — ATORVASTATIN CALCIUM 20 MG PO TABS
20.0000 mg | ORAL_TABLET | Freq: Every day | ORAL | Status: DC
  Filled 2016-05-02: qty 1

## 2016-05-02 MED ORDER — INSULIN GLARGINE 100 UNIT/ML ~~LOC~~ SOLN
25.0000 [IU] | Freq: Every day | SUBCUTANEOUS | Status: DC
  Administered 2016-05-02 – 2016-05-03 (×2): 25 [IU] via SUBCUTANEOUS
  Filled 2016-05-02 (×3): qty 0.25

## 2016-05-02 MED ORDER — LOSARTAN POTASSIUM 50 MG PO TABS: 100.0000 mg | ORAL_TABLET | Freq: Every day | ORAL | Status: DC

## 2016-05-02 MED ORDER — ASPIRIN 81 MG PO CHEW
324.0000 mg | CHEWABLE_TABLET | ORAL | Status: AC
  Administered 2016-05-02: 324 mg via ORAL
  Filled 2016-05-02: qty 4

## 2016-05-02 MED ORDER — FENOFIBRATE 160 MG PO TABS
160.0000 mg | ORAL_TABLET | Freq: Every day | ORAL | Status: DC
  Administered 2016-05-03 – 2016-05-04 (×2): 160 mg via ORAL
  Filled 2016-05-02 (×2): qty 1

## 2016-05-02 MED ORDER — METOPROLOL TARTRATE 25 MG PO TABS
25.0000 mg | ORAL_TABLET | Freq: Two times a day (BID) | ORAL | Status: DC
  Administered 2016-05-02 – 2016-05-04 (×4): 25 mg via ORAL
  Filled 2016-05-02 (×4): qty 1

## 2016-05-02 MED ORDER — HEPARIN (PORCINE) IN NACL 100-0.45 UNIT/ML-% IJ SOLN
900.0000 [IU]/h | INTRAMUSCULAR | Status: DC
  Administered 2016-05-02: 800 [IU]/h via INTRAVENOUS
  Filled 2016-05-02: qty 250

## 2016-05-02 MED ORDER — ASPIRIN 81 MG PO CHEW: 81.0000 mg | CHEWABLE_TABLET | Freq: Every day | ORAL | Status: DC

## 2016-05-02 MED ORDER — SODIUM CHLORIDE 0.9% FLUSH: 3.0000 mL | INTRAVENOUS | Status: DC | PRN

## 2016-05-02 MED ORDER — SODIUM CHLORIDE 0.9% FLUSH
3.0000 mL | Freq: Two times a day (BID) | INTRAVENOUS | Status: DC
  Administered 2016-05-03: 3 mL via INTRAVENOUS

## 2016-05-02 MED ORDER — ASPIRIN 300 MG RE SUPP: 300.0000 mg | RECTAL | Status: AC

## 2016-05-02 MED ORDER — PANTOPRAZOLE SODIUM 40 MG PO TBEC
40.0000 mg | DELAYED_RELEASE_TABLET | Freq: Every day | ORAL | Status: DC
  Administered 2016-05-03 – 2016-05-04 (×2): 40 mg via ORAL
  Filled 2016-05-02 (×2): qty 1

## 2016-05-02 MED ORDER — ONDANSETRON HCL 4 MG/2ML IJ SOLN: 4.0000 mg | Freq: Four times a day (QID) | INTRAMUSCULAR | Status: DC | PRN

## 2016-05-02 MED ORDER — ASPIRIN EC 81 MG PO TBEC
81.0000 mg | DELAYED_RELEASE_TABLET | Freq: Every day | ORAL | Status: DC
  Administered 2016-05-04: 81 mg via ORAL
  Filled 2016-05-02 (×2): qty 1

## 2016-05-02 MED ORDER — TRAZODONE HCL 50 MG PO TABS
25.0000 mg | ORAL_TABLET | Freq: Every day | ORAL | Status: AC
  Administered 2016-05-03: 25 mg via ORAL
  Filled 2016-05-02: qty 1

## 2016-05-02 MED ORDER — SODIUM CHLORIDE 0.9 % IV SOLN: 250.0000 mL | INTRAVENOUS | Status: DC | PRN

## 2016-05-02 MED ORDER — HEPARIN BOLUS VIA INFUSION
2000.0000 [IU] | Freq: Once | INTRAVENOUS | Status: AC
  Administered 2016-05-02: 2000 [IU] via INTRAVENOUS
  Filled 2016-05-02: qty 2000

## 2016-05-02 MED ORDER — SODIUM CHLORIDE 0.9 % WEIGHT BASED INFUSION
1.0000 mL/kg/h | INTRAVENOUS | Status: DC
  Administered 2016-05-03: 1 mL/kg/h via INTRAVENOUS

## 2016-05-02 MED ORDER — SODIUM CHLORIDE 0.9 % WEIGHT BASED INFUSION
3.0000 mL/kg/h | INTRAVENOUS | Status: DC
  Administered 2016-05-03: 3 mL/kg/h via INTRAVENOUS

## 2016-05-02 MED ORDER — ASPIRIN 81 MG PO CHEW
81.0000 mg | CHEWABLE_TABLET | ORAL | Status: AC
Start: 2016-05-03 — End: 2016-05-03
  Administered 2016-05-03: 81 mg via ORAL
  Filled 2016-05-02: qty 1

## 2016-05-02 MED ORDER — DIPHENHYDRAMINE HCL 25 MG PO CAPS
25.0000 mg | ORAL_CAPSULE | Freq: Every evening | ORAL | Status: DC | PRN
  Administered 2016-05-03: 25 mg via ORAL
  Filled 2016-05-02: qty 1

## 2016-05-02 NOTE — ED Provider Notes (Signed)
CSN: [id number]     Arrival date & time 05/02/16  1142 History   First MD Initiated Contact with Patient 05/02/16 1205     Chief Complaint  Patient presents with  . Chest Pain  . Shortness of Breath     (Consider location/radiation/quality/duration/timing/severity/associated sxs/prior Treatment) HPI Comments: Pt comes in with cc of chest pain and syncope. PT has hx of CAD, DM. She reports that since 6 days ago she has been hacing some chest discomfort and dib. Chest discomfort is substernal and a bit to the L side and is described as dull pain and is intermittent, unprovoked. PT also mentioned that she has some exertional dyspnea. + cough - dry, no fevers, wheezing, lo hx of lung dz, or smoking hx. Pt has no hx of PE, DVT and denies any exogenous estrogen use, long distance travels or surgery in the past 6 weeks, active cancer, recent immobilization.  Pt reports that she had similar chest discomfort when she was discovered to have CAD. Pt had a stress last year.   ROS 10 Systems reviewed and are negative for acute change except as noted in the HPI.     Patient is a 82 y.o. female presenting with chest pain and shortness of breath. The history is provided by the patient.  Chest Pain Associated symptoms: shortness of breath   Shortness of Breath Associated symptoms: chest pain     Past Medical History  Diagnosis Date  . CAD (coronary artery disease)   . Diabetes ([place])   . Reflux   . Acute thyroiditis   . Polycystic ovaries   . Pure hypercholesterolemia   . Hypertriglyceridemia   . Essential hypertension, benign   . Urinary tract infection, site not specified   . Postsurgical aortocoronary bypass status   . Other secondary thrombocytopenia   . Hyperkalemia   . S/P CABG x 2   . PNA (pneumonia) 3/14   Past Surgical History  Procedure Laterality Date  . Cholecystectomy    . S/p cabg  8/09    Romualdo Bolk to LAD, SVG to PDA   Family History  Problem Relation Age of Onset   . Liver disease Mother 31    deceased  . Cancer Brother     x3  . Stroke Brother    Social History  Substance Use Topics  . Smoking status: Never Smoker   . Smokeless tobacco: None  . Alcohol Use: No   OB History    No data available     Review of Systems  Respiratory: Positive for shortness of breath.   Cardiovascular: Positive for chest pain.      Allergies  Levofloxacin  Home Medications   Prior to Admission medications   Medication Sig Start Date End Date Taking? Authorizing Provider  aspirin 81 MG chewable tablet Chew 81 mg by mouth daily.   Yes Historical Provider, MD  atorvastatin (LIPITOR) 20 MG tablet Take 1 tablet by mouth daily at 6 PM.  02/18/15  Yes Historical Provider, MD  diphenhydramine-acetaminophen (TYLENOL PM) 25-500 MG TABS Take 2 tablets by mouth at bedtime.   Yes Historical Provider, MD  fenofibrate (TRICOR) 145 MG tablet TAKE 1 TABLET BY MOUTH EVERY EVENING 05/19/15  Yes Peter M Martinique, MD  LANTUS SOLOSTAR 100 UNIT/ML injection Inject 25 Units into the skin at bedtime.  09/22/12  Yes Historical Provider, MD  losartan (COZAAR) 100 MG tablet Take 1 tablet (100 mg total) by mouth daily. Please keep your upcoming appointment (04/02/16) for  refills. 03/28/16  Yes Peter M Martinique, MD  metoprolol tartrate (LOPRESSOR) 25 MG tablet Take 1 tablet (25 mg total) by mouth 2 (two) times daily. 04/17/13  Yes Peter M Martinique, MD  omeprazole (PRILOSEC) 20 MG capsule TAKE 1 CAPSULE BY MOUTH EVERY EVENING 04/23/15  Yes Peter M Martinique, MD  Vitamin D, Ergocalciferol, (DRISDOL) 50000 units CAPS capsule Take 50,000 Units by mouth 2 (two) times a week. 04/04/16  Yes Historical Provider, MD   BP 174/79 mmHg  Pulse 87  Temp(Src) 97.7 F (36.5 C) (Oral)  Resp 18  SpO2 95% Physical Exam  Constitutional: She is oriented to person, place, and time. She appears well-developed.  HENT:  Head: Normocephalic and atraumatic.  Eyes: Conjunctivae and EOM are normal. Pupils are equal, round,  and reactive to light.  Neck: Normal range of motion. Neck supple.  Cardiovascular: Normal rate, regular rhythm and normal heart sounds.   Pulmonary/Chest: Effort normal and breath sounds normal. No respiratory distress.  Abdominal: Soft. Bowel sounds are normal. She exhibits no distension. There is no tenderness. There is no rebound and no guarding.  Musculoskeletal: She exhibits no edema or tenderness.  Neurological: She is alert and oriented to person, place, and time.  Skin: Skin is warm and dry.  Nursing note and vitals reviewed.   ED Course  Procedures (including critical care time) Labs Review Labs Reviewed  BASIC METABOLIC PANEL - Abnormal; Notable for the following:    Glucose, Bld 201 (*)    BUN 25 (*)    Creatinine, Ser 1.19 (*)    GFR calc non Af Amer 42 (*)    GFR calc Af Amer 49 (*)    All other components within normal limits  CBG MONITORING, ED - Abnormal; Notable for the following:    Glucose-Capillary 180 (*)    All other components within normal limits  CBC  I-STAT TROPOININ, ED  CBG MONITORING, ED    Imaging Review Dg Chest 2 View  05/02/2016  CLINICAL DATA:  Shortness of breath for several days EXAM: CHEST  2 VIEW COMPARISON:  Chest radiograph February 07, 2013 and chest CT Mar 05, 2016 FINDINGS: Previously noted cavitary mass in the right upper lobe is no longer appreciable. There is now mild scarring in this area. In comparison with the prior study, there is now a nodular opacity lateral to the left hilum measuring 1.4 x 0.9 cm. There is a stable small calcified granuloma in the right upper lobe near the apex. The lungs elsewhere clear. Heart size and pulmonary vascularity are normal. No adenopathy. Patient is status post internal mammary bypass grafting. No bone lesions. There are surgical clips in the right upper quadrant. IMPRESSION: Nodular opacity lateral to the left hilum. As this finding is new compared to prior study, noncontrast enhanced chest CT advised  to further evaluate. Scarring right upper lobe. Small granuloma right upper lobe which is stable. No edema or consolidation. Stable cardiac silhouette. Electronically Signed   By: Lowella Grip III M.D.   On: 05/02/2016 12:51   Ct Chest Wo Contrast  05/02/2016  CLINICAL DATA:  Exertional chest pain. Shortness of breath, nausea, weakness and dizziness. Occasional syncope. Symptoms have worsened over the past week. EXAM: CT CHEST WITHOUT CONTRAST TECHNIQUE: Multidetector CT imaging of the chest was performed following the standard protocol without IV contrast. COMPARISON:  PA and lateral chest this same day and 02/07/2013. CT chest 03/05/2013. FINDINGS: Cardiovascular: The patient is status post CABG. Calcific aortic and coronary atherosclerosis  is noted. Heart size is normal. No pericardial effusion. Mediastinum/Nodes: No lymphadenopathy is identified. Lungs/Pleura: No pleural effusion. Punctate calcified granuloma right upper lobe is identified. Mild dependent atelectasis is seen. Upper Abdomen: The gallbladder has been removed. Mild fatty infiltration of the liver is seen. Musculoskeletal: Unremarkable. IMPRESSION: No acute cardiopulmonary disease. No finding to explain the patient's symptoms. Extensive calcific aortic and coronary atherosclerosis. The patient is status post CABG. Fatty infiltration of the liver. Electronically Signed   By: Inge Rise M.D.   On: 05/02/2016 13:47   I have personally reviewed and evaluated these images and lab results as part of my medical decision-making.   EKG Interpretation   Date/Time:  Monday May 02 2016 11:44:14 EDT Ventricular Rate:  91 PR Interval:  138 QRS Duration: 82 QT Interval:  354 QTC Calculation: 435 R Axis:   49 Text Interpretation:  Normal sinus rhythm Normal ECG No acute changes No  significant change since last tracing Confirmed by Kathrynn Humble, MD, Thelma Comp  [id number]) on 05/02/2016 12:17:01 PM      MDM   Final diagnoses:  Exertional  dyspnea  Angina pectoris ([place])    PT comes in with cc of chest pain and exertional dyspnea. Chest pain is similar to her previous ACS. There is also exertional dyspnea, which doesn't appear to be due to primary lung dz given no wheezing and doesn't appear to be due to PE either. No clinical concerns for CHF. Currently, it appears to angina. We will consult [facility]. CT chest w/o contrast ordered for the nodule evaluation.    Varney Biles, MD 05/02/16 [phone]

## 2016-05-02 NOTE — Telephone Encounter (Signed)
New message    Pt c/o Shortness Of Breath: STAT if SOB developed within the last 24 hours or pt is noticeably SOB on the phone  1. Are you currently SOB (can you hear that pt is SOB on the phone)? yes 2. How long have you been experiencing SOB?  3. Are you SOB when sitting or when up moving around? both 4. Are you currently experiencing any other symptoms? Chest pain

## 2016-05-02 NOTE — Progress Notes (Signed)
ANTICOAGULATION CONSULT NOTE - Initial Consult  Pharmacy Consult for heparin Indication: chest pain/ACS  Allergies  Allergen Reactions  . Levofloxacin     Patient Measurements:   Heparin Dosing Weight: 66.5kg  Vital Signs: Temp: 97.7 F (36.5 C) (07/17 1149) Temp Source: Oral (07/17 1149) BP: 161/71 mmHg (07/17 1630) Pulse Rate: 76 (07/17 1630)  Labs:  Recent Labs  05/02/16 1155  HGB 12.9  HCT 39.0  PLT 233  CREATININE 1.19*    CrCl cannot be calculated (Unknown ideal weight.).   Medical History: Past Medical History  Diagnosis Date  . CAD (coronary artery disease)   . Diabetes ([place])   . Reflux   . Acute thyroiditis   . Polycystic ovaries   . Pure hypercholesterolemia   . Hypertriglyceridemia   . Essential hypertension, benign   . Urinary tract infection, site not specified   . Postsurgical aortocoronary bypass status   . Other secondary thrombocytopenia   . Hyperkalemia   . S/P CABG x 2   . PNA (pneumonia) 3/14    Medications:  Scheduled:  . aspirin  324 mg Oral NOW   Or  . aspirin  300 mg Rectal NOW  . aspirin  81 mg Oral Daily  . [START ON 05/03/2016] aspirin  81 mg Oral Pre-Cath  . [START ON 05/03/2016] aspirin EC  81 mg Oral Daily  . atorvastatin  20 mg Oral q1800  . diphenhydramine-acetaminophen  2 tablet Oral QHS  . fenofibrate  160 mg Oral Daily  . heparin  2,000 Units Intravenous Once  . Insulin Glargine  25 Units Subcutaneous QHS  . [START ON 05/03/2016] losartan  100 mg Oral Daily  . metoprolol tartrate  25 mg Oral BID  . pantoprazole  40 mg Oral Daily  . sodium chloride flush  3 mL Intravenous Q12H    Assessment: 4 YOF with PMH of HTN, HLD, IDDM, CKD stage III and CAD s/p 2v CABG (LIMA to LAD, SVG to PDA) 2009 [facility] who presented with intermittent chest pain and SOB. Heparin per pharmacy for ACS. No PTA anticoagulation. H&H 12.9/39.0, plt 233. Cath planned for 7/18.  Goal of Therapy:  Heparin level 0.3-0.7 units/ml Monitor  platelets by anticoagulation protocol: Yes   Plan:  -Heparin 2000 unit bolus x1 followed by 800 units/hr -8hr HL -Daily HL/CBC -Monitor s/sx bleeding  Stephens November, PharmD Clinical Pharmacist  6:12 PM, 05/02/2016

## 2016-05-02 NOTE — ED Notes (Signed)
Cardiology PA at bedside. 

## 2016-05-02 NOTE — ED Notes (Signed)
Pt here with worsening exertional CP with SOB, nausea, weakness, dizziness, and also sts she passed out once when the pain hit her. Sh sts worsening since last Tuesday and she was sent here by her PCP. Pt a/o x 4 at this time. No pain at present.

## 2016-05-02 NOTE — Telephone Encounter (Signed)
I spoke with the pt and she complains of symptoms of SOB and CP that have been off and on since last Tuesday.  The pt states that she has SOB any time that she is up moving around and when she sits down to rest the SOB subsides.  The pt also has CP in the middle of chest and left breast that lasts 1-2 minutes, 5-6/10 pain and this comes and goes and occurs with rest and exertion.  The pt would like to arrange an appointment for further evaluation. I will discuss this pt with the DOD.

## 2016-05-02 NOTE — Telephone Encounter (Signed)
I discussed this pt with Dr Ellyn Hack and due to the pt's symptoms he would like the pt to go to the [facility] ER. I spoke with the pt and made her aware of Dr Allison Quarry recommendation. The pt has had SOB and CP this morning and did sound SOB on the phone.  The pt agrees to proceed to the ER.

## 2016-05-02 NOTE — H&P (Signed)
Patient ID: Jennifer Warner. Jennifer Warner MRN: [id number], DOB/AGE: [birth year]   Admit date: 05/02/2016   Primary Physician: Foye Spurling, MD Primary Cardiologist: Dr. Martinique  Referred by Dr. Kathrynn Humble EDP  Pt. Profile:  [name] is a 82 year old Caucasian female with PMH of HTN, HLD, IDDM, CKD stage III and CAD s/p 2v CABG (LIMA to LAD, SVG to PDA) 2009 [facility] who presented with intermittent chest pain and SOB  Problem List  Past Medical History  Diagnosis Date  . CAD (coronary artery disease)   . Diabetes ([place])   . Reflux   . Acute thyroiditis   . Polycystic ovaries   . Pure hypercholesterolemia   . Hypertriglyceridemia   . Essential hypertension, benign   . Urinary tract infection, site not specified   . Postsurgical aortocoronary bypass status   . Other secondary thrombocytopenia   . Hyperkalemia   . S/P CABG x 2   . PNA (pneumonia) 3/14    Past Surgical History  Procedure Laterality Date  . Cholecystectomy    . S/p cabg  8/09    Romualdo Bolk to LAD, SVG to PDA     Allergies  Allergies  Allergen Reactions  . Levofloxacin     HPI  [name] is a 82 year old Caucasian female with PMH of HTN, HLD, IDDM, CKD stage III and CAD s/p 2v CABG (LIMA to LAD, SVG to PDA) 2009 [facility]. Her bypass procedure was proceeded by abnormal Myoview. Cardiac catheterization at the time demonstrated critical ostial LAD stenosis. After bypass surgery, she has had a normal Myoview in April 2015. She was last seen by Dr. Martinique in the office on 04/03/2015 at which time the patient was doing well.  She was in her usual sterile health until 2 weeks ago, in the last 2 weeks, she has been experiencing substernal chest pain, nonradiating, along with shortness of breath more noticeable with exertion. She says her chest discomfort may occur both at rest and with exertion, however the shortness of breath with exertion is very similar to the previous angina. She says last Tuesday she had a  particularly bad event with dizziness and shortness of breath. She came into the house with her dog and had to put her "head down" because she was very dizzy. She says she might have lost consciousness for a minute but she is not sure. The entire event lasted roughly 20 minutes. She also described diaphoresis at the same time. She called Dr. Doug Sou office and was instructed to come to [facility] ED.  Significant laboratory finding on arrival to the ED include creatinine of 1.19. Normal CBC. Troponin negative. Chest x-ray showed the possible nodule, however CT of the chest without contrast was negative for nodule, CT of the chest was also negative for acute cardiopulmonary process. EKG showed normal sinus rhythm without significant ST-T wave changes. Cardiology has been consulted for chest pain.    Home Medications  Prior to Admission medications   Medication Sig Start Date End Date Taking? Authorizing Provider  aspirin 81 MG chewable tablet Chew 81 mg by mouth daily.   Yes Historical Provider, MD  atorvastatin (LIPITOR) 20 MG tablet Take 1 tablet by mouth daily at 6 PM.  02/18/15  Yes Historical Provider, MD  diphenhydramine-acetaminophen (TYLENOL PM) 25-500 MG TABS Take 2 tablets by mouth at bedtime.   Yes Historical Provider, MD  fenofibrate (TRICOR) 145 MG tablet TAKE 1 TABLET BY MOUTH EVERY EVENING 05/19/15  Yes Collier Salina  M Martinique, MD  LANTUS SOLOSTAR 100 UNIT/ML injection Inject 25 Units into the skin at bedtime.  09/22/12  Yes Historical Provider, MD  losartan (COZAAR) 100 MG tablet Take 1 tablet (100 mg total) by mouth daily. Please keep your upcoming appointment (04/02/16) for refills. 03/28/16  Yes Addisyn Leclaire M Martinique, MD  metoprolol tartrate (LOPRESSOR) 25 MG tablet Take 1 tablet (25 mg total) by mouth 2 (two) times daily. 04/17/13  Yes Lesieli Bresee M Martinique, MD  omeprazole (PRILOSEC) 20 MG capsule TAKE 1 CAPSULE BY MOUTH EVERY EVENING 04/23/15  Yes Stephannie Broner M Martinique, MD  Vitamin D, Ergocalciferol, (DRISDOL) 50000  units CAPS capsule Take 50,000 Units by mouth 2 (two) times a week. 04/04/16  Yes Historical Provider, MD    Family History  Family History  Problem Relation Age of Onset  . Liver disease Mother 62    deceased  . Cancer Brother     x3  . Stroke Brother     Social History  Social History   Social History  . Marital Status: Married    Spouse Name: N/A  . Number of Children: 2  . Years of Education: N/A   Occupational History  . retired- Air traffic controller     2010   Social History Main Topics  . Smoking status: Never Smoker   . Smokeless tobacco: Not on file  . Alcohol Use: No  . Drug Use: No  . Sexual Activity: Not on file   Other Topics Concern  . Not on file   Social History Narrative     Review of Systems General:  No chills, fever, night sweats or weight changes.  Cardiovascular:  No edema, orthopnea, palpitations, paroxysmal nocturnal dyspnea. +chest pain, dyspnea on exertion Dermatological: No rash, lesions/masses Respiratory: +occasional nonproductive cough +dyspnea Urologic: No hematuria, dysuria Abdominal:   No nausea, vomiting, diarrhea, bright red blood per rectum, melena, or hematemesis Neurologic:  No visual changes, wkns, changes in mental status. All other systems reviewed and are otherwise negative except as noted above.  Physical Exam  Blood pressure 136/66, pulse 66, temperature 97.7 F (36.5 C), temperature source Oral, resp. rate 16, SpO2 94 %.  General: Pleasant, NAD Psych: Normal affect. Neuro: Alert and oriented X 3. Moves all extremities spontaneously. HEENT: Normal  Neck: Supple without bruits or JVD. Lungs:  Resp regular and unlabored, CTA. Heart: RRR no s3, s4, or murmurs. Abdomen: Soft, non-tender, non-distended, BS + x 4.  Extremities: No clubbing, cyanosis or edema. DP/PT/Radials 2+ and equal bilaterally.  Labs  Troponin (Point of Care Test)  Recent Labs  05/02/16 1214  TROPIPOC 0.03   No results for input(s):  CKTOTAL, CKMB, TROPONINI in the last 72 hours. Lab Results  Component Value Date   WBC 4.2 05/02/2016   HGB 12.9 05/02/2016   HCT 39.0 05/02/2016   MCV 93.5 05/02/2016   PLT 233 05/02/2016    Recent Labs Lab 05/02/16 1155  NA 138  K 4.9  CL 109  CO2 23  BUN 25*  CREATININE 1.19*  CALCIUM 10.1  GLUCOSE 201*   No results found for: CHOL, HDL, LDLCALC, TRIG No results found for: DDIMER   Radiology/Studies  Dg Chest 2 View  05/02/2016  CLINICAL DATA:  Shortness of breath for several days EXAM: CHEST  2 VIEW COMPARISON:  Chest radiograph February 07, 2013 and chest CT Mar 05, 2016 FINDINGS: Previously noted cavitary mass in the right upper lobe is no longer appreciable. There is now mild scarring in this  area. In comparison with the prior study, there is now a nodular opacity lateral to the left hilum measuring 1.4 x 0.9 cm. There is a stable small calcified granuloma in the right upper lobe near the apex. The lungs elsewhere clear. Heart size and pulmonary vascularity are normal. No adenopathy. Patient is status post internal mammary bypass grafting. No bone lesions. There are surgical clips in the right upper quadrant. IMPRESSION: Nodular opacity lateral to the left hilum. As this finding is new compared to prior study, noncontrast enhanced chest CT advised to further evaluate. Scarring right upper lobe. Small granuloma right upper lobe which is stable. No edema or consolidation. Stable cardiac silhouette. Electronically Signed   By: Lowella Grip III M.D.   On: 05/02/2016 12:51   Ct Chest Wo Contrast  05/02/2016  CLINICAL DATA:  Exertional chest pain. Shortness of breath, nausea, weakness and dizziness. Occasional syncope. Symptoms have worsened over the past week. EXAM: CT CHEST WITHOUT CONTRAST TECHNIQUE: Multidetector CT imaging of the chest was performed following the standard protocol without IV contrast. COMPARISON:  PA and lateral chest this same day and 02/07/2013. CT chest  03/05/2013. FINDINGS: Cardiovascular: The patient is status post CABG. Calcific aortic and coronary atherosclerosis is noted. Heart size is normal. No pericardial effusion. Mediastinum/Nodes: No lymphadenopathy is identified. Lungs/Pleura: No pleural effusion. Punctate calcified granuloma right upper lobe is identified. Mild dependent atelectasis is seen. Upper Abdomen: The gallbladder has been removed. Mild fatty infiltration of the liver is seen. Musculoskeletal: Unremarkable. IMPRESSION: No acute cardiopulmonary disease. No finding to explain the patient's symptoms. Extensive calcific aortic and coronary atherosclerosis. The patient is status post CABG. Fatty infiltration of the liver. Electronically Signed   By: Inge Rise M.D.   On: 05/02/2016 13:47    ECG  Normal sinus rhythm without significant ST-T wave changes.   ASSESSMENT AND PLAN  1. Chest pain: with similarity to previous angina  - will discuss with MD, given similarity of chest discomfort with previous angina, may consider cardiac catheterization. Unclear if the patient truly passed out last week on Tuesday.  2. CAD s/p 2v CABG LIMA to LAD and SVG to PDA 2009 in [place]  3. HTN: controlled on metoprolol  4. HLD: continue lipitor and fenofibrate  5. IDDM  6. CKD stage III   Signed, Almyra Deforest, PA-C 05/02/2016, 2:37 PM Patient seen and examined and history reviewed. Agree with above findings and plan. 82 yo WF well known to me. In good health until one week ago. Since then she has typical exertional substernal chest pain and dyspnea. This is associated with dizziness and last Tuesday she may have transiently blacked out. She is s/p PTCA and subsequent CABG in 2008. Normal Myoview one year ago. Exam is unremarkable. Lungs are clear. No gallop or murmur. Ecg is normal. Troponin is normal x 1.  CXR showed ? Nodule but CT negative  Her presentation is very concerning for unstable angina. She is now 8 years out from  CABG. I would recommend proceeding directly to cardiac cath. Will schedule for tomorrow. The procedure and risks were reviewed including but not limited to death, myocardial infarction, stroke, arrythmias, bleeding, transfusion, emergency surgery, dye allergy, or renal dysfunction. The patient voices understanding and is agreeable to proceed. She states that after her cath in 2008 she had a significant groin bleed and required 5 units of blood. Hopefully we can use a left radial approach. Will hydrate pre cath.   Oprah Camarena Martinique, [place] 05/02/2016 3:23 PM

## 2016-05-03 ENCOUNTER — Encounter (HOSPITAL_COMMUNITY)
Admission: EM | Disposition: A | Discharge status: Home / Self Care | Payer: Self-pay | Source: Home / Self Care | Attending: Emergency Medicine

## 2016-05-03 DIAGNOSIS — Z951 Presence of aortocoronary bypass graft: Secondary | ICD-10-CM | POA: Diagnosis not present

## 2016-05-03 DIAGNOSIS — N183 Chronic kidney disease, stage 3 (moderate): Secondary | ICD-10-CM | POA: Diagnosis not present

## 2016-05-03 DIAGNOSIS — I25709 Atherosclerosis of coronary artery bypass graft(s), unspecified, with unspecified angina pectoris: Secondary | ICD-10-CM | POA: Diagnosis not present

## 2016-05-03 DIAGNOSIS — Z7982 Long term (current) use of aspirin: Secondary | ICD-10-CM | POA: Diagnosis not present

## 2016-05-03 DIAGNOSIS — I10 Essential (primary) hypertension: Secondary | ICD-10-CM | POA: Diagnosis not present

## 2016-05-03 DIAGNOSIS — I25110 Atherosclerotic heart disease of native coronary artery with unstable angina pectoris: Secondary | ICD-10-CM | POA: Diagnosis not present

## 2016-05-03 DIAGNOSIS — E785 Hyperlipidemia, unspecified: Secondary | ICD-10-CM | POA: Diagnosis not present

## 2016-05-03 DIAGNOSIS — I25119 Atherosclerotic heart disease of native coronary artery with unspecified angina pectoris: Secondary | ICD-10-CM | POA: Diagnosis not present

## 2016-05-03 DIAGNOSIS — I129 Hypertensive chronic kidney disease with stage 1 through stage 4 chronic kidney disease, or unspecified chronic kidney disease: Secondary | ICD-10-CM | POA: Diagnosis not present

## 2016-05-03 DIAGNOSIS — I249 Acute ischemic heart disease, unspecified: Secondary | ICD-10-CM | POA: Diagnosis not present

## 2016-05-03 DIAGNOSIS — I200 Unstable angina: Secondary | ICD-10-CM | POA: Diagnosis not present

## 2016-05-03 DIAGNOSIS — E781 Pure hyperglyceridemia: Secondary | ICD-10-CM | POA: Diagnosis not present

## 2016-05-03 DIAGNOSIS — I4891 Unspecified atrial fibrillation: Secondary | ICD-10-CM | POA: Diagnosis not present

## 2016-05-03 DIAGNOSIS — E1122 Type 2 diabetes mellitus with diabetic chronic kidney disease: Secondary | ICD-10-CM | POA: Diagnosis not present

## 2016-05-03 DIAGNOSIS — I2582 Chronic total occlusion of coronary artery: Secondary | ICD-10-CM | POA: Diagnosis not present

## 2016-05-03 DIAGNOSIS — E7800 Pure hypercholesterolemia, unspecified: Secondary | ICD-10-CM | POA: Diagnosis not present

## 2016-05-03 DIAGNOSIS — I209 Angina pectoris, unspecified: Secondary | ICD-10-CM | POA: Insufficient documentation

## 2016-05-03 HISTORY — PX: CARDIAC CATHETERIZATION: SHX172

## 2016-05-03 LAB — GLUCOSE, CAPILLARY
GLUCOSE-CAPILLARY: 198 mg/dL — AB (ref 65–99)
Glucose-Capillary: 99 mg/dL (ref 65–99)
Glucose-Capillary: 89 mg/dL (ref 65–99)
Glucose-Capillary: 72 mg/dL (ref 65–99)
Glucose-Capillary: 117 mg/dL — ABNORMAL HIGH (ref 65–99)

## 2016-05-03 LAB — HEMOGLOBIN A1C
Hgb A1c MFr Bld: 6.6 % — ABNORMAL HIGH (ref 4.8–5.6)
Mean Plasma Glucose: 143 mg/dL

## 2016-05-03 LAB — CBC
HCT: 36.5 % (ref 36.0–46.0)
Hemoglobin: 12.1 g/dL (ref 12.0–15.0)
MCH: 31.2 pg (ref 26.0–34.0)
MCHC: 33.2 g/dL (ref 30.0–36.0)
MCV: 94.1 fL (ref 78.0–100.0)
PLATELETS: 208 10*3/uL (ref 150–400)
RBC: 3.88 MIL/uL (ref 3.87–5.11)
RDW: 12.7 % (ref 11.5–15.5)
WBC: 3.8 10*3/uL — ABNORMAL LOW (ref 4.0–10.5)

## 2016-05-03 LAB — BASIC METABOLIC PANEL
Anion gap: 7 (ref 5–15)
BUN: 20 mg/dL (ref 6–20)
CHLORIDE: 107 mmol/L (ref 101–111)
CO2: 25 mmol/L (ref 22–32)
Calcium: 9.8 mg/dL (ref 8.9–10.3)
Creatinine, Ser: 1.1 mg/dL — ABNORMAL HIGH (ref 0.44–1.00)
GFR calc Af Amer: 54 mL/min — ABNORMAL LOW (ref >60)
GFR calc non Af Amer: 46 mL/min — ABNORMAL LOW (ref >60)
GLUCOSE: 100 mg/dL — AB (ref 65–99)
POTASSIUM: 3.9 mmol/L (ref 3.5–5.1)
Sodium: 139 mmol/L (ref 135–145)

## 2016-05-03 LAB — PROTIME-INR
INR: 1.13 (ref 0.00–1.49)
PROTHROMBIN TIME: 14.7 s (ref 11.6–15.2)

## 2016-05-03 LAB — TROPONIN I
Troponin I: <0.03 ng/mL (ref <0.03)
Troponin I: <0.03 ng/mL (ref <0.03)

## 2016-05-03 LAB — HEPARIN LEVEL (UNFRACTIONATED): Heparin Unfractionated: 0.29 IU/mL — ABNORMAL LOW (ref 0.30–0.70)

## 2016-05-03 SURGERY — LEFT HEART CATH AND CORONARY ANGIOGRAPHY

## 2016-05-03 MED ORDER — HEPARIN (PORCINE) IN NACL 100-0.45 UNIT/ML-% IJ SOLN
900.0000 [IU]/h | INTRAMUSCULAR | Status: DC
  Administered 2016-05-03: 900 [IU]/h via INTRAVENOUS
  Filled 2016-05-03: qty 250

## 2016-05-03 MED ORDER — INSULIN ASPART 100 UNIT/ML ~~LOC~~ SOLN: 0.0000 [IU] | Freq: Every day | SUBCUTANEOUS | Status: DC

## 2016-05-03 MED ORDER — IOPAMIDOL (ISOVUE-370) INJECTION 76%
INTRAVENOUS | Status: AC
  Filled 2016-05-03: qty 50

## 2016-05-03 MED ORDER — MIDAZOLAM HCL 2 MG/2ML IJ SOLN
INTRAMUSCULAR | Status: AC
  Filled 2016-05-03: qty 2

## 2016-05-03 MED ORDER — LIDOCAINE HCL (PF) 1 % IJ SOLN
INTRAMUSCULAR | Status: DC | PRN
  Administered 2016-05-03: 13:00:00

## 2016-05-03 MED ORDER — SODIUM CHLORIDE 0.9% FLUSH: 3.0000 mL | INTRAVENOUS | Status: DC | PRN

## 2016-05-03 MED ORDER — LIDOCAINE HCL (PF) 1 % IJ SOLN
INTRAMUSCULAR | Status: AC
  Filled 2016-05-03: qty 30

## 2016-05-03 MED ORDER — IOPAMIDOL (ISOVUE-370) INJECTION 76%
INTRAVENOUS | Status: DC | PRN
  Administered 2016-05-03: 180 mL via INTRA_ARTERIAL

## 2016-05-03 MED ORDER — LIDOCAINE HCL (PF) 1 % IJ SOLN
INTRAMUSCULAR | Status: DC | PRN
  Administered 2016-05-03: 30 mL via SUBCUTANEOUS

## 2016-05-03 MED ORDER — ASPIRIN 81 MG PO CHEW
81.0000 mg | CHEWABLE_TABLET | Freq: Every day | ORAL | Status: DC
  Administered 2016-05-03: 81 mg via ORAL
  Filled 2016-05-03: qty 1

## 2016-05-03 MED ORDER — HEPARIN SODIUM (PORCINE) 1000 UNIT/ML IJ SOLN
INTRAMUSCULAR | Status: AC
  Filled 2016-05-03: qty 1

## 2016-05-03 MED ORDER — ACETAMINOPHEN 325 MG PO TABS: 650.0000 mg | ORAL_TABLET | ORAL | Status: DC | PRN

## 2016-05-03 MED ORDER — FENTANYL CITRATE (PF) 100 MCG/2ML IJ SOLN
INTRAMUSCULAR | Status: DC | PRN
  Administered 2016-05-03: 50 ug via INTRAVENOUS

## 2016-05-03 MED ORDER — SODIUM CHLORIDE 0.9% FLUSH
3.0000 mL | Freq: Two times a day (BID) | INTRAVENOUS | Status: DC
  Administered 2016-05-03: 3 mL via INTRAVENOUS

## 2016-05-03 MED ORDER — HEPARIN (PORCINE) IN NACL 2-0.9 UNIT/ML-% IJ SOLN
INTRAMUSCULAR | Status: AC
  Filled 2016-05-03: qty 1000

## 2016-05-03 MED ORDER — VERAPAMIL HCL 2.5 MG/ML IV SOLN
INTRAVENOUS | Status: DC | PRN
  Administered 2016-05-03: 13:00:00 via INTRA_ARTERIAL

## 2016-05-03 MED ORDER — VERAPAMIL HCL 2.5 MG/ML IV SOLN
INTRAVENOUS | Status: AC
  Filled 2016-05-03: qty 2

## 2016-05-03 MED ORDER — SODIUM CHLORIDE 0.9 % IV SOLN: 250.0000 mL | INTRAVENOUS | Status: DC | PRN

## 2016-05-03 MED ORDER — SODIUM CHLORIDE 0.9 % WEIGHT BASED INFUSION: 3.0000 mL/kg/h | INTRAVENOUS | Status: AC

## 2016-05-03 MED ORDER — MIDAZOLAM HCL 2 MG/2ML IJ SOLN
INTRAMUSCULAR | Status: DC | PRN
  Administered 2016-05-03 (×2): 1 mg via INTRAVENOUS

## 2016-05-03 MED ORDER — INSULIN ASPART 100 UNIT/ML ~~LOC~~ SOLN
0.0000 [IU] | Freq: Three times a day (TID) | SUBCUTANEOUS | Status: DC
  Administered 2016-05-04: 1 [IU] via SUBCUTANEOUS
  Administered 2016-05-04: 2 [IU] via SUBCUTANEOUS

## 2016-05-03 MED ORDER — ONDANSETRON HCL 4 MG/2ML IJ SOLN: 4.0000 mg | Freq: Four times a day (QID) | INTRAMUSCULAR | Status: DC | PRN

## 2016-05-03 MED ORDER — FENTANYL CITRATE (PF) 100 MCG/2ML IJ SOLN
INTRAMUSCULAR | Status: AC
  Filled 2016-05-03: qty 2

## 2016-05-03 MED ORDER — OXYCODONE-ACETAMINOPHEN 5-325 MG PO TABS: 1.0000 | ORAL_TABLET | ORAL | Status: DC | PRN

## 2016-05-03 MED ORDER — IOPAMIDOL (ISOVUE-370) INJECTION 76%
INTRAVENOUS | Status: AC
  Filled 2016-05-03: qty 125

## 2016-05-03 SURGICAL SUPPLY — 16 items
CATH INFINITI 5 FR IM (CATHETERS) ×2 IMPLANT
CATH INFINITI 5 FR LCB (CATHETERS) ×2 IMPLANT
CATH INFINITI 5FR ANG PIGTAIL (CATHETERS) ×2 IMPLANT
CATH INFINITI JR4 5F (CATHETERS) ×2 IMPLANT
CATH SITESEER 5F MULTI A 2 (CATHETERS) ×2 IMPLANT
DEVICE RAD COMP TR BAND LRG (VASCULAR PRODUCTS) ×2 IMPLANT
GLIDESHEATH SLEND A-KIT 6F 22G (SHEATH) ×2 IMPLANT
KIT ESSENTIALS PG (KITS) ×2 IMPLANT
KIT HEART LEFT (KITS) ×2 IMPLANT
PACK CARDIAC CATHETERIZATION (CUSTOM PROCEDURE TRAY) ×2 IMPLANT
SHEATH PINNACLE 5F 10CM (SHEATH) ×2 IMPLANT
TRANSDUCER W/STOPCOCK (MISCELLANEOUS) ×2 IMPLANT
TUBING CIL FLEX 10 FLL-RA (TUBING) ×2 IMPLANT
WIRE COUGAR XT STRL 300CM (WIRE) ×2 IMPLANT
WIRE HI TORQ VERSACORE-J 145CM (WIRE) ×2 IMPLANT
WIRE SAFE-T 1.5MM-J .035X260CM (WIRE) ×2 IMPLANT

## 2016-05-03 NOTE — H&P (View-Only) (Signed)
Patient: Jennifer Warner / Admit Date: 05/02/2016 / Date of Encounter: 05/03/2016, 8:53 AM   Subjective: No chest pain or SOB. Feeling well this AM.   Objective: Telemetry: NSR Physical Exam: Blood pressure 122/47, pulse 70, temperature 97.8 F (36.6 C), temperature source Oral, resp. rate 18, height 5\' 3"  (1.6 m), weight 146 lb 11.2 oz (66.543 kg), SpO2 95 %. General: Well developed, well nourished WF in no acute distress. Head: Normocephalic, atraumatic, sclera non-icteric, no xanthomas, nares are without discharge. Neck: Negative for carotid bruits. JVP not elevated. Lungs: Clear bilaterally to auscultation without wheezes, rales, or rhonchi. Breathing is unlabored. Heart: RRR S1 S2 without murmurs, rubs, or gallops.  Abdomen: Soft, non-tender, non-distended with normoactive bowel sounds. No rebound/guarding. Extremities: No clubbing or cyanosis. No edema. Distal pedal pulses are 2+ and equal bilaterally. Neuro: Alert and oriented X 3. Moves all extremities spontaneously. Psych:  Responds to questions appropriately with a normal affect.   Intake/Output Summary (Last 24 hours) at 05/03/16 0853 Last data filed at 05/03/16 0745  Gross per 24 hour  Intake      0 ml  Output   1250 ml  Net  -1250 ml    Inpatient Medications:  . aspirin EC  81 mg Oral Daily  . atorvastatin  20 mg Oral q1800  . fenofibrate  160 mg Oral Daily  . insulin glargine  25 Units Subcutaneous QHS  . metoprolol tartrate  25 mg Oral BID  . pantoprazole  40 mg Oral Daily  . sodium chloride flush  3 mL Intravenous Q12H   Infusions:  . sodium chloride 1 mL/kg/hr (05/03/16 0700)  . heparin 900 Units/hr (05/03/16 0340)    Labs:  Recent Labs  05/02/16 1155 05/03/16 0601  NA 138 139  K 4.9 3.9  CL 109 107  CO2 23 25  GLUCOSE 201* 100*  BUN 25* 20  CREATININE 1.19* 1.10*  CALCIUM 10.1 9.8   No results for input(s): AST, ALT, ALKPHOS, BILITOT, PROT, ALBUMIN in the last 72 hours.  Recent Labs  05/02/16 1155 05/03/16 0601  WBC 4.2 3.8*  HGB 12.9 12.1  HCT 39.0 36.5  MCV 93.5 94.1  PLT 233 208    Recent Labs  05/02/16 1923 05/02/16 2344 05/03/16 0601  TROPONINI 0.03* <0.03 <0.03   Invalid input(s): POCBNP No results for input(s): HGBA1C in the last 72 hours.   Radiology/Studies:  Dg Chest 2 View  05/02/2016  CLINICAL DATA:  Shortness of breath for several days EXAM: CHEST  2 VIEW COMPARISON:  Chest radiograph February 07, 2013 and chest CT Mar 05, 2016 FINDINGS: Previously noted cavitary mass in the right upper lobe is no longer appreciable. There is now mild scarring in this area. In comparison with the prior study, there is now a nodular opacity lateral to the left hilum measuring 1.4 x 0.9 cm. There is a stable small calcified granuloma in the right upper lobe near the apex. The lungs elsewhere clear. Heart size and pulmonary vascularity are normal. No adenopathy. Patient is status post internal mammary bypass grafting. No bone lesions. There are surgical clips in the right upper quadrant. IMPRESSION: Nodular opacity lateral to the left hilum. As this finding is new compared to prior study, noncontrast enhanced chest CT advised to further evaluate. Scarring right upper lobe. Small granuloma right upper lobe which is stable. No edema or consolidation. Stable cardiac silhouette. Electronically Signed   By: Lowella Grip III M.D.   On: 05/02/2016 12:51   Ct  Chest Wo Contrast  05/02/2016  CLINICAL DATA:  Exertional chest pain. Shortness of breath, nausea, weakness and dizziness. Occasional syncope. Symptoms have worsened over the past week. EXAM: CT CHEST WITHOUT CONTRAST TECHNIQUE: Multidetector CT imaging of the chest was performed following the standard protocol without IV contrast. COMPARISON:  PA and lateral chest this same day and 02/07/2013. CT chest 03/05/2013. FINDINGS: Cardiovascular: The patient is status post CABG. Calcific aortic and coronary atherosclerosis is noted.  Heart size is normal. No pericardial effusion. Mediastinum/Nodes: No lymphadenopathy is identified. Lungs/Pleura: No pleural effusion. Punctate calcified granuloma right upper lobe is identified. Mild dependent atelectasis is seen. Upper Abdomen: The gallbladder has been removed. Mild fatty infiltration of the liver is seen. Musculoskeletal: Unremarkable. IMPRESSION: No acute cardiopulmonary disease. No finding to explain the patient's symptoms. Extensive calcific aortic and coronary atherosclerosis. The patient is status post CABG. Fatty infiltration of the liver. Electronically Signed   By: Inge Rise M.D.   On: 05/02/2016 13:47     Assessment and Plan  77F with HTN, HLD, IDDM, CKD stage III and CAD s/p 2v CABG (LIMA to LAD, SVG to PDA) 2009 [facility] who presented with dizziness, intermittent chest pain and SOB.   1. Chest pain/dizziness/SOB concerning for [place] with history of CAD s/p remote CABG - plan cath today. Note prior hx significant groin bleed - hopeful for left radial approach. Risks/benefits already discussed by Dr. Martinique. Patient does not have any questions - just anxious to proceed.  2. CKD stage III - stable Cr today. Hold ARB in prep for cath (CrCl 38).  3. HTN - follow with holding of ARB. Can use PRN hydralazine or nitrates peri-cath if needed.  4. HLD - f/u lipids/LFTs in AM. Continue statin.  Signed, Melina Copa PA-C Pager: [phone]   Patient seen and examined and history reviewed. Agree with above findings and plan. No further chest pain last night. Feels well. On IV heparin. 1/3 troponin levels slightly elevated at .03. Ecg is normal. Renal function is better post hydration.For cardiac cath today.  Toriano Aikey Martinique, [place] 05/03/2016 10:11 AM

## 2016-05-03 NOTE — Progress Notes (Signed)
ANTICOAGULATION CONSULT NOTE - Follow Up Consult  Pharmacy Consult for Heparin  Indication: chest pain/ACS  Allergies  Allergen Reactions  . Levofloxacin     Patient Measurements: Height: 5\' 3"  (160 cm) Weight: 146 lb 11.2 oz (66.543 kg) IBW/kg (Calculated) : 52.4 Vital Signs: Temp: 97.8 F (36.6 C) (07/18 0459) Temp Source: Oral (07/18 0459) BP: 129/65 mmHg (07/18 1525) Pulse Rate: 72 (07/18 1525)  Labs:  Recent Labs  05/02/16 1155 05/02/16 1923 05/02/16 2344 05/03/16 0208 05/03/16 0359 05/03/16 0601  HGB 12.9  --   --   --   --  12.1  HCT 39.0  --   --   --   --  36.5  PLT 233  --   --   --   --  208  LABPROT  --   --   --   --  14.7  --   INR  --   --   --   --  1.13  --   HEPARINUNFRC  --   --   --  0.29*  --   --   CREATININE 1.19*  --   --   --   --  1.10*  TROPONINI  --  0.03* <0.03  --   --  <0.03    Estimated Creatinine Clearance: 38 mL/min (by C-G formula based on Cr of 1.1).  Assessment: 82 y/o F with hx CABG, Pharmacy consulted to resume heparin drip 8 hrs after sheath removed in pt s/p cardiac cath. Sheath pulled 1450.  Morning HL was 0.29 on 800 units/hr.   Goal of Therapy:  Heparin level 0.3-0.7 units/ml Monitor platelets by anticoagulation protocol: Yes   Plan:  Resume heparin 8 hrs s/p sheath pull tonight at 2300 at rate of 900 units/hr Check 8 hr HL at 0700 am Daily HL/CBC  Eudelia Bunch, Pharm.D. [phone] 05/03/2016 4:09 PM

## 2016-05-03 NOTE — Progress Notes (Signed)
Colbert Coyer, [place] with Cardiology.  Patient without orders for Novolog Correction scale/ SSI.  Verbal/Telephone orders given to start Novolog Sensitive Correction Scale/ SSI (0-9 units) TID AC + HS.  Orders placed into [facility].     --Will follow patient during hospitalization--  Wyn Quaker RN, MSN, CDE Diabetes Coordinator Inpatient Glycemic Control Team Team Pager: [phone] (8a-5p)

## 2016-05-03 NOTE — Plan of Care (Signed)
Problem: Consults Goal: Cardiac Cath Patient Education (See Patient Education module for education specifics.) Outcome: Completed/Met Date Met:  05/03/16 Pre-procedure education completed and patient consented. No questions or concerns at this time.

## 2016-05-03 NOTE — Progress Notes (Signed)
ANTICOAGULATION CONSULT NOTE - Follow Up Consult  Pharmacy Consult for Heparin  Indication: chest pain/ACS  Allergies  Allergen Reactions  . Levofloxacin     Patient Measurements: Height: 5\' 3"  (160 cm) Weight: 152 lb 1.9 oz (69 kg) IBW/kg (Calculated) : 52.4 Vital Signs: Temp: 97.9 F (36.6 C) (07/17 2052) Temp Source: Oral (07/17 2052) BP: 125/62 mmHg (07/17 2052) Pulse Rate: 75 (07/17 2052)  Labs:  Recent Labs  05/02/16 1155 05/02/16 1923 05/02/16 2344 05/03/16 0208  HGB 12.9  --   --   --   HCT 39.0  --   --   --   PLT 233  --   --   --   HEPARINUNFRC  --   --   --  0.29*  CREATININE 1.19*  --   --   --   TROPONINI  --  0.03* <0.03  --     Estimated Creatinine Clearance: 35.7 mL/min (by C-G formula based on Cr of 1.19).  Assessment: 82 y/o F with hx CABG now on heparin for CP, planning for cath today, no issues per RN.   Goal of Therapy:  Heparin level 0.3-0.7 units/ml Monitor platelets by anticoagulation protocol: Yes   Plan:  -Inc heparin to 900 units/hr -1130 HL  Narda Bonds 05/03/2016,3:16 AM

## 2016-05-03 NOTE — Progress Notes (Addendum)
Site area: RFA Site Prior to Removal:  Level 0 Pressure Applied For:17min Manual:  yes  Patient Status During Pull:  stable Post Pull Site:  Level 0 Post Pull Instructions Given:yes   Post Pull Pulses Present: palpable Dressing Applied:  tegaderm Bedrest begins @  1525 till 1925 Comments:

## 2016-05-03 NOTE — Research (Signed)
LEADERS FREE II Informed Consent   Subject Name: Jennifer Warner. [name]  Subject met inclusion and exclusion criteria.  The informed consent form, study requirements and expectations were reviewed with the subject and questions and concerns were addressed prior to the signing of the consent form.  The subject verbalized understanding of the trail requirements.  The subject agreed to participate in the LEADERS FREE II trial and signed the informed consent.  The informed consent was obtained prior to performance of any protocol-specific procedures for the subject.  A copy of the signed informed consent was given to the subject and a copy was placed in the subject's medical record.  Jake Bathe Jr. 05/03/2016, [phone]

## 2016-05-03 NOTE — Care Management Obs Status (Signed)
[place] NOTIFICATION   Patient Details  Name: Jennifer Warner. [name] MRN: [id number] Date of Birth: [birth year]   Medicare Observation Status Notification Given:  Yes    Bethena Roys, RN 05/03/2016, 11:13 AM

## 2016-05-03 NOTE — Progress Notes (Signed)
Patient: Jennifer Warner / Admit Date: 05/02/2016 / Date of Encounter: 05/03/2016, 8:53 AM   Subjective: No chest pain or SOB. Feeling well this AM.   Objective: Telemetry: NSR Physical Exam: Blood pressure 122/47, pulse 70, temperature 97.8 F (36.6 C), temperature source Oral, resp. rate 18, height 5\' 3"  (1.6 m), weight 146 lb 11.2 oz (66.543 kg), SpO2 95 %. General: Well developed, well nourished WF in no acute distress. Head: Normocephalic, atraumatic, sclera non-icteric, no xanthomas, nares are without discharge. Neck: Negative for carotid bruits. JVP not elevated. Lungs: Clear bilaterally to auscultation without wheezes, rales, or rhonchi. Breathing is unlabored. Heart: RRR S1 S2 without murmurs, rubs, or gallops.  Abdomen: Soft, non-tender, non-distended with normoactive bowel sounds. No rebound/guarding. Extremities: No clubbing or cyanosis. No edema. Distal pedal pulses are 2+ and equal bilaterally. Neuro: Alert and oriented X 3. Moves all extremities spontaneously. Psych:  Responds to questions appropriately with a normal affect.   Intake/Output Summary (Last 24 hours) at 05/03/16 0853 Last data filed at 05/03/16 0745  Gross per 24 hour  Intake      0 ml  Output   1250 ml  Net  -1250 ml    Inpatient Medications:  . aspirin EC  81 mg Oral Daily  . atorvastatin  20 mg Oral q1800  . fenofibrate  160 mg Oral Daily  . insulin glargine  25 Units Subcutaneous QHS  . metoprolol tartrate  25 mg Oral BID  . pantoprazole  40 mg Oral Daily  . sodium chloride flush  3 mL Intravenous Q12H   Infusions:  . sodium chloride 1 mL/kg/hr (05/03/16 0700)  . heparin 900 Units/hr (05/03/16 0340)    Labs:  Recent Labs  05/02/16 1155 05/03/16 0601  NA 138 139  K 4.9 3.9  CL 109 107  CO2 23 25  GLUCOSE 201* 100*  BUN 25* 20  CREATININE 1.19* 1.10*  CALCIUM 10.1 9.8   No results for input(s): AST, ALT, ALKPHOS, BILITOT, PROT, ALBUMIN in the last 72 hours.  Recent Labs  05/02/16 1155 05/03/16 0601  WBC 4.2 3.8*  HGB 12.9 12.1  HCT 39.0 36.5  MCV 93.5 94.1  PLT 233 208    Recent Labs  05/02/16 1923 05/02/16 2344 05/03/16 0601  TROPONINI 0.03* <0.03 <0.03   Invalid input(s): POCBNP No results for input(s): HGBA1C in the last 72 hours.   Radiology/Studies:  Dg Chest 2 View  05/02/2016  CLINICAL DATA:  Shortness of breath for several days EXAM: CHEST  2 VIEW COMPARISON:  Chest radiograph February 07, 2013 and chest CT Mar 05, 2016 FINDINGS: Previously noted cavitary mass in the right upper lobe is no longer appreciable. There is now mild scarring in this area. In comparison with the prior study, there is now a nodular opacity lateral to the left hilum measuring 1.4 x 0.9 cm. There is a stable small calcified granuloma in the right upper lobe near the apex. The lungs elsewhere clear. Heart size and pulmonary vascularity are normal. No adenopathy. Patient is status post internal mammary bypass grafting. No bone lesions. There are surgical clips in the right upper quadrant. IMPRESSION: Nodular opacity lateral to the left hilum. As this finding is new compared to prior study, noncontrast enhanced chest CT advised to further evaluate. Scarring right upper lobe. Small granuloma right upper lobe which is stable. No edema or consolidation. Stable cardiac silhouette. Electronically Signed   By: Lowella Grip III M.D.   On: 05/02/2016 12:51   Ct  Chest Wo Contrast  05/02/2016  CLINICAL DATA:  Exertional chest pain. Shortness of breath, nausea, weakness and dizziness. Occasional syncope. Symptoms have worsened over the past week. EXAM: CT CHEST WITHOUT CONTRAST TECHNIQUE: Multidetector CT imaging of the chest was performed following the standard protocol without IV contrast. COMPARISON:  PA and lateral chest this same day and 02/07/2013. CT chest 03/05/2013. FINDINGS: Cardiovascular: The patient is status post CABG. Calcific aortic and coronary atherosclerosis is noted.  Heart size is normal. No pericardial effusion. Mediastinum/Nodes: No lymphadenopathy is identified. Lungs/Pleura: No pleural effusion. Punctate calcified granuloma right upper lobe is identified. Mild dependent atelectasis is seen. Upper Abdomen: The gallbladder has been removed. Mild fatty infiltration of the liver is seen. Musculoskeletal: Unremarkable. IMPRESSION: No acute cardiopulmonary disease. No finding to explain the patient's symptoms. Extensive calcific aortic and coronary atherosclerosis. The patient is status post CABG. Fatty infiltration of the liver. Electronically Signed   By: Inge Rise M.D.   On: 05/02/2016 13:47     Assessment and Plan  39F with HTN, HLD, IDDM, CKD stage III and CAD s/p 2v CABG (LIMA to LAD, SVG to PDA) [facility] who presented with dizziness, intermittent chest pain and SOB.   1. Chest pain/dizziness/SOB concerning for [place] with history of CAD s/p remote CABG - plan cath today. Note prior hx significant groin bleed - hopeful for left radial approach. Risks/benefits already discussed by Dr. Martinique. Patient does not have any questions - just anxious to proceed.  2. CKD stage III - stable Cr today. Hold ARB in prep for cath (CrCl 38).  3. HTN - follow with holding of ARB. Can use PRN hydralazine or nitrates peri-cath if needed.  4. HLD - f/u lipids/LFTs in AM. Continue statin.  Signed, Melina Copa PA-C Pager: [phone]   Patient seen and examined and history reviewed. Agree with above findings and plan. No further chest pain last night. Feels well. On IV heparin. 1/3 troponin levels slightly elevated at .03. Ecg is normal. Renal function is better post hydration.For cardiac cath today.  Peter Martinique, [place] 05/03/2016 10:11 AM

## 2016-05-03 NOTE — Interval H&P Note (Signed)
Cath Lab Visit (complete for each Cath Lab visit)  Clinical Evaluation Leading to the Procedure:   ACS: Yes.    Non-ACS:    Anginal Classification: CCS III  Anti-ischemic medical therapy: Maximal Therapy (2 or more classes of medications)  Non-Invasive Test Results: No non-invasive testing performed  Prior CABG: Previous CABG      History and Physical Interval Note:  05/03/2016 11:49 AM  [name]  has presented today for surgery, with the diagnosis of angina  The various methods of treatment have been discussed with the patient and family. After consideration of risks, benefits and other options for treatment, the patient has consented to  Procedure(s): Left Heart Cath and Coronary Angiography (N/A) as a surgical intervention .  The patient's history has been reviewed, patient examined, no change in status, stable for surgery.  I have reviewed the patient's chart and labs.  Questions were answered to the patient's satisfaction.     Belva Crome III

## 2016-05-03 NOTE — Progress Notes (Signed)
Pt as well as family given instructions on site care. Pt will probably however need reenforcement of this teaching. Pt was having a hard time keeping her wrist still with the band on. Pt tolerated band removal well. No hematoma or bleeding noted. Hoover Brunette, RN

## 2016-05-04 ENCOUNTER — Encounter (HOSPITAL_COMMUNITY): Payer: Self-pay | Admitting: Interventional Cardiology

## 2016-05-04 DIAGNOSIS — E785 Hyperlipidemia, unspecified: Secondary | ICD-10-CM | POA: Diagnosis not present

## 2016-05-04 DIAGNOSIS — I4891 Unspecified atrial fibrillation: Secondary | ICD-10-CM | POA: Diagnosis not present

## 2016-05-04 DIAGNOSIS — Z951 Presence of aortocoronary bypass graft: Secondary | ICD-10-CM | POA: Diagnosis not present

## 2016-05-04 DIAGNOSIS — I25708 Atherosclerosis of coronary artery bypass graft(s), unspecified, with other forms of angina pectoris: Secondary | ICD-10-CM

## 2016-05-04 DIAGNOSIS — I25709 Atherosclerosis of coronary artery bypass graft(s), unspecified, with unspecified angina pectoris: Secondary | ICD-10-CM | POA: Diagnosis not present

## 2016-05-04 DIAGNOSIS — I129 Hypertensive chronic kidney disease with stage 1 through stage 4 chronic kidney disease, or unspecified chronic kidney disease: Secondary | ICD-10-CM | POA: Diagnosis not present

## 2016-05-04 DIAGNOSIS — Z794 Long term (current) use of insulin: Secondary | ICD-10-CM

## 2016-05-04 DIAGNOSIS — I2582 Chronic total occlusion of coronary artery: Secondary | ICD-10-CM | POA: Diagnosis not present

## 2016-05-04 DIAGNOSIS — E781 Pure hyperglyceridemia: Secondary | ICD-10-CM | POA: Diagnosis not present

## 2016-05-04 DIAGNOSIS — Z7982 Long term (current) use of aspirin: Secondary | ICD-10-CM | POA: Diagnosis not present

## 2016-05-04 DIAGNOSIS — E119 Type 2 diabetes mellitus without complications: Secondary | ICD-10-CM

## 2016-05-04 DIAGNOSIS — I25110 Atherosclerotic heart disease of native coronary artery with unstable angina pectoris: Secondary | ICD-10-CM | POA: Diagnosis not present

## 2016-05-04 DIAGNOSIS — E7800 Pure hypercholesterolemia, unspecified: Secondary | ICD-10-CM | POA: Diagnosis not present

## 2016-05-04 DIAGNOSIS — I10 Essential (primary) hypertension: Secondary | ICD-10-CM | POA: Diagnosis not present

## 2016-05-04 DIAGNOSIS — N183 Chronic kidney disease, stage 3 (moderate): Secondary | ICD-10-CM | POA: Diagnosis not present

## 2016-05-04 DIAGNOSIS — E1122 Type 2 diabetes mellitus with diabetic chronic kidney disease: Secondary | ICD-10-CM | POA: Diagnosis not present

## 2016-05-04 LAB — LIPID PANEL
CHOL/HDL RATIO: 6.6 ratio
Cholesterol: 178 mg/dL (ref 0–200)
HDL: 27 mg/dL — AB (ref >40)
LDL CALC: 84 mg/dL (ref 0–99)
TRIGLYCERIDES: 337 mg/dL — AB (ref <150)
VLDL: 67 mg/dL — AB (ref 0–40)

## 2016-05-04 LAB — BASIC METABOLIC PANEL
Anion gap: 9 (ref 5–15)
BUN: 16 mg/dL (ref 6–20)
CALCIUM: 10 mg/dL (ref 8.9–10.3)
CO2: 22 mmol/L (ref 22–32)
CREATININE: 1.09 mg/dL — AB (ref 0.44–1.00)
Chloride: 108 mmol/L (ref 101–111)
GFR calc Af Amer: 54 mL/min — ABNORMAL LOW (ref >60)
GFR, EST NON AFRICAN AMERICAN: 47 mL/min — AB (ref >60)
GLUCOSE: 148 mg/dL — AB (ref 65–99)
Potassium: 4.1 mmol/L (ref 3.5–5.1)
SODIUM: 139 mmol/L (ref 135–145)

## 2016-05-04 LAB — CBC
HEMATOCRIT: 35.6 % — AB (ref 36.0–46.0)
HEMOGLOBIN: 11.7 g/dL — AB (ref 12.0–15.0)
MCH: 30.8 pg (ref 26.0–34.0)
MCHC: 32.9 g/dL (ref 30.0–36.0)
MCV: 93.7 fL (ref 78.0–100.0)
Platelets: 209 10*3/uL (ref 150–400)
RBC: 3.8 MIL/uL — ABNORMAL LOW (ref 3.87–5.11)
RDW: 12.8 % (ref 11.5–15.5)
WBC: 4.8 10*3/uL (ref 4.0–10.5)

## 2016-05-04 LAB — GLUCOSE, CAPILLARY
Glucose-Capillary: 137 mg/dL — ABNORMAL HIGH (ref 65–99)
Glucose-Capillary: 160 mg/dL — ABNORMAL HIGH (ref 65–99)

## 2016-05-04 LAB — HEPARIN LEVEL (UNFRACTIONATED): HEPARIN UNFRACTIONATED: 0.29 [IU]/mL — AB (ref 0.30–0.70)

## 2016-05-04 MED ORDER — NITROGLYCERIN 0.4 MG SL SUBL: 0.4000 mg | SUBLINGUAL_TABLET | SUBLINGUAL | Status: DC | PRN

## 2016-05-04 MED ORDER — ASPIRIN 81 MG PO TBEC: 81.0000 mg | DELAYED_RELEASE_TABLET | Freq: Every day | ORAL | Status: DC

## 2016-05-04 MED ORDER — LOSARTAN POTASSIUM 50 MG PO TABS
100.0000 mg | ORAL_TABLET | Freq: Every day | ORAL | Status: DC
  Administered 2016-05-04: 100 mg via ORAL
  Filled 2016-05-04: qty 2

## 2016-05-04 MED FILL — Heparin Sodium (Porcine) Inj 1000 Unit/ML: INTRAMUSCULAR | Qty: 10 | Status: AC

## 2016-05-04 NOTE — Progress Notes (Signed)
Patient Name: Jennifer Warner. [name] Date of Encounter: 05/04/2016  Active Problems:   CAD (coronary artery disease)   Hypertriglyceridemia   Essential hypertension, benign   S/P CABG x 2   Type 2 diabetes mellitus ([place])   Unstable angina ([place])   Hyperlipidemia   Essential hypertension   Angina pectoris [facility])   Primary Cardiologist: Dr. Martinique Patient Profile: 15F with HTN, HLD, IDDM, CKD stage III and CAD s/p 2v CABG (LIMA to LAD, SVG to PDA) 2009 [facility] who presented with dizziness, intermittent chest pain and SOB.   SUBJECTIVE: Feels well. Denies chest pain and SOB.   OBJECTIVE Filed Vitals:   05/03/16 2053 05/03/16 2210 05/04/16 0613 05/04/16 0818  BP: 151/56 178/70 149/56 178/71  Pulse: 94 108 53 87  Temp: 98.7 F (37.1 C)  98.5 F (36.9 C)   TempSrc: Oral  Oral   Resp: 18  19   Height:      Weight:   147 lb 3.2 oz (66.769 kg)   SpO2: 95%  95%     Intake/Output Summary (Last 24 hours) at 05/04/16 0946 Last data filed at 05/04/16 [id number]  Gross per 24 hour  Intake    240 ml  Output   1650 ml  Net  -1410 ml   Filed Weights   05/02/16 1530 05/03/16 0459 05/04/16 [id number]  Weight: 152 lb 1.9 oz (69 kg) 146 lb 11.2 oz (66.543 kg) 147 lb 3.2 oz (66.769 kg)    PHYSICAL EXAM General: Well developed, well nourished, female in no acute distress. Head: Normocephalic, atraumatic.  Neck: Supple without bruits, No JVD. Lungs:  Resp regular and unlabored, CTA. Heart: RRR, S1, S2, no S3, S4, or murmur; no rub. Abdomen: Soft, non-tender, non-distended, BS + x 4.  Extremities: No clubbing, cyanosis, No edema.  Neuro: Alert and oriented X 3. Moves all extremities spontaneously. Psych: Normal affect.  LABS: CBC: Recent Labs  05/03/16 0601 05/04/16 0504  WBC 3.8* 4.8  HGB 12.1 11.7*  HCT 36.5 35.6*  MCV 94.1 93.7  PLT 208 209   INR: Recent Labs  05/03/16 0359  INR [id number]   Basic Metabolic Panel: Recent Labs  05/03/16 0601 05/04/16 0504  NA 139 139  K  3.9 4.1  CL 107 108  CO2 25 22  GLUCOSE 100* 148*  BUN 20 16  CREATININE 1.10* 1.09*  CALCIUM 9.8 10.0   Cardiac Enzymes: Recent Labs  05/02/16 1923 05/02/16 2344 05/03/16 0601  TROPONINI 0.03* <0.03 <0.03    Recent Labs  05/02/16 1214  TROPIPOC 0.03   Hemoglobin A1C: Recent Labs  05/02/16 1923  HGBA1C 6.6*   Fasting Lipid Panel: Recent Labs  05/04/16 0504  CHOL 178  HDL 27*  LDLCALC 84  TRIG 337*  CHOLHDL 6.6     Current facility-administered medications:  .  acetaminophen (TYLENOL) tablet 650 mg, 650 mg, Oral, Q4H PRN, Almyra Deforest, PA .  acetaminophen (TYLENOL) tablet 650 mg, 650 mg, Oral, Q4H PRN, Belva Crome, MD .  aspirin chewable tablet 81 mg, 81 mg, Oral, Daily, Belva Crome, MD, 81 mg at 05/03/16 1715 .  aspirin EC tablet 81 mg, 81 mg, Oral, Daily, [place], [place], 81 mg at 05/04/16 0818 .  atorvastatin (LIPITOR) tablet 20 mg, 20 mg, Oral, q1800, Almyra Deforest, PA, 20 mg at 05/02/16 2009 .  diphenhydrAMINE (BENADRYL) capsule 25 mg, 25 mg, Oral, QHS PRN, Almyra Deforest, PA, 25 mg at 05/03/16 2216 .  fenofibrate tablet 160  mg, 160 mg, Oral, Daily, [place], [place], 160 mg at 05/04/16 0817 .  insulin aspart (novoLOG) injection 0-5 Units, 0-5 Units, Subcutaneous, QHS, Dayna N Dunn, PA-C .  insulin aspart (novoLOG) injection 0-9 Units, 0-9 Units, Subcutaneous, TID WC, Dayna N Dunn, PA-C, 1 Units at 05/04/16 0819 .  insulin glargine (LANTUS) injection 25 Units, 25 Units, Subcutaneous, QHS, [place], [place], 25 Units at 05/03/16 2213 .  losartan (COZAAR) tablet 100 mg, 100 mg, Oral, Daily, Preeya Cleckley M Martinique, MD .  metoprolol tartrate (LOPRESSOR) tablet 25 mg, 25 mg, Oral, BID, Almyra Deforest, [place], 25 mg at 05/04/16 0818 .  nitroGLYCERIN (NITROSTAT) SL tablet 0.4 mg, 0.4 mg, Sublingual, Q5 Min x 3 PRN, Almyra Deforest, PA .  ondansetron (ZOFRAN) injection 4 mg, 4 mg, Intravenous, Q6H PRN, Almyra Deforest, PA .  ondansetron [facility]) injection 4 mg, 4 mg, Intravenous, Q6H PRN, Belva Crome, MD .   oxyCODONE-acetaminophen (PERCOCET/ROXICET) 5-325 MG per tablet 1-2 tablet, 1-2 tablet, Oral, Q4H PRN, Belva Crome, MD .  pantoprazole (PROTONIX) EC tablet 40 mg, 40 mg, Oral, Daily, [place], [place], 40 mg at 05/04/16 0818    TELE: NSR       ECG: NSR  Left Heart Cath and Coronary Angiography 05/03/16 1. Prox RCA to Mid RCA lesion, 70% stenosed. 2. Mid Cx lesion, 80% stenosed. 3. Ost LPDA to LPDA lesion, 70% stenosed. 4. SVG was injected . 5. was injected is normal in caliber, and is anatomically normal. 6. Prox LAD to Mid LAD lesion, 100% stenosed. 7. Ost LM to LM lesion, 70% stenosed.   Prolonged procedure, caused by failed attempt at left radial approach due to radial loop and the development of a microperforation due to diagnostic catheter/guidewire injury. Hemostasis was achieved with compression and an Ace bandage.  Saphenous vein graft to the third obtuse marginal is widely patent. The procedure was prolonged because the graft was not marked and required aortography to identify the site of origin.  Widely patent LIMA to LAD.  Nondominant moderately diseased, 70% mid right coronary artery.  Total occlusion of the proximal native LAD.  High-grade obstruction of the mid circumflex between the large first and third obtuse marginal branch. There is also 70% stenosis in the PDA.  Normal left ventricular function and hemodynamics.  Atrial fibrillation developed transiently, lasting approximally 3 minutes. Atrial fibrillation was incited by mechanical irritation during left ventriculography.   RECOMMENDATIONS:   Watch medial left arm for evidence of bleeding/hematoma.  Monitor femoral site for evidence of bleeding.  Heparin can probably be discontinued and converted to DVT prophylaxis.     Radiology/Studies: Dg Chest 2 View  05/02/2016  CLINICAL DATA:  Shortness of breath for several days EXAM: CHEST  2 VIEW COMPARISON:  Chest radiograph February 07, 2013 and chest CT  Mar 05, 2016 FINDINGS: Previously noted cavitary mass in the right upper lobe is no longer appreciable. There is now mild scarring in this area. In comparison with the prior study, there is now a nodular opacity lateral to the left hilum measuring 1.4 x 0.9 cm. There is a stable small calcified granuloma in the right upper lobe near the apex. The lungs elsewhere clear. Heart size and pulmonary vascularity are normal. No adenopathy. Patient is status post internal mammary bypass grafting. No bone lesions. There are surgical clips in the right upper quadrant. IMPRESSION: Nodular opacity lateral to the left hilum. As this finding is new compared to prior study, noncontrast enhanced chest CT advised to  further evaluate. Scarring right upper lobe. Small granuloma right upper lobe which is stable. No edema or consolidation. Stable cardiac silhouette. Electronically Signed   By: Lowella Grip III M.D.   On: 05/02/2016 12:51   Ct Chest Wo Contrast  05/02/2016  CLINICAL DATA:  Exertional chest pain. Shortness of breath, nausea, weakness and dizziness. Occasional syncope. Symptoms have worsened over the past week. EXAM: CT CHEST WITHOUT CONTRAST TECHNIQUE: Multidetector CT imaging of the chest was performed following the standard protocol without IV contrast. COMPARISON:  PA and lateral chest this same day and 02/07/2013. CT chest 03/05/2013. FINDINGS: Cardiovascular: The patient is status post CABG. Calcific aortic and coronary atherosclerosis is noted. Heart size is normal. No pericardial effusion. Mediastinum/Nodes: No lymphadenopathy is identified. Lungs/Pleura: No pleural effusion. Punctate calcified granuloma right upper lobe is identified. Mild dependent atelectasis is seen. Upper Abdomen: The gallbladder has been removed. Mild fatty infiltration of the liver is seen. Musculoskeletal: Unremarkable. IMPRESSION: No acute cardiopulmonary disease. No finding to explain the patient's symptoms. Extensive calcific  aortic and coronary atherosclerosis. The patient is status post CABG. Fatty infiltration of the liver. Electronically Signed   By: Inge Rise M.D.   On: 05/02/2016 13:47     Current Medications:  . aspirin  81 mg Oral Daily  . aspirin EC  81 mg Oral Daily  . atorvastatin  20 mg Oral q1800  . fenofibrate  160 mg Oral Daily  . insulin aspart  0-5 Units Subcutaneous QHS  . insulin aspart  0-9 Units Subcutaneous TID WC  . insulin glargine  25 Units Subcutaneous QHS  . losartan  100 mg Oral Daily  . metoprolol tartrate  25 mg Oral BID  . pantoprazole  40 mg Oral Daily      ASSESSMENT AND PLAN: Active Problems:   CAD (coronary artery disease)   Hypertriglyceridemia   Essential hypertension, benign   S/P CABG x 2   Type 2 diabetes mellitus ([place])   Unstable angina ([facility])   Hyperlipidemia   Essential hypertension   Angina pectoris ([place])   1. Chest pain/dizziness/SOB concerning for [place] with history of CAD s/p remote CABG - had left heart cath yesterday, report above. Will continue medical therapy.   2. CKD stage III - stable Cr today. Hold ARB in prep for cath (CrCl 38), will resume at discharge.   3. HTN - Resume losartan at home. Will follow up BMP in 2 weeks.  4. HLD - Continue statin.  5. Microperforation at left radial site- Stable, with small hematoma, level one.        Signed, Arbutus Leas , NP 9:46 AM 05/04/2016 Pager [phone] Patient seen and examined and history reviewed. Agree with above findings and plan. Feels very well this am. No chest pain or dyspnea. Left brachial area with mild bruising but no hematoma. Left radial and right femoral access sites without hematoma. BP is elevated so we will resume losartan. (held for cath). Resume other medication. She is stable for DC today.  Frida Wahlstrom Martinique, [place] 05/04/2016 9:57 AM

## 2016-05-04 NOTE — Discharge Summary (Signed)
Discharge Summary    Patient ID: Jennifer Warner. Jennifer Warner,  MRN: [id number], DOB/AGE: 1934-07-30 82 y.o.  Admit date: 05/02/2016 Discharge date: 05/04/2016  Primary Care Provider: Foye Spurling Primary Cardiologist: Dr. Martinique  Discharge Diagnoses    Active Problems:   CAD (coronary artery disease)   Hypertriglyceridemia   Essential hypertension, benign   S/P CABG x 2   Type 2 diabetes mellitus ([place])   Unstable angina (HCC)   Hyperlipidemia   Essential hypertension   Angina pectoris (HCC)   Allergies Allergies  Allergen Reactions  . Levofloxacin     Diagnostic Studies/Procedures  Left Heart Cath and Coronary Angiography 05/03/16 1. Prox RCA to Mid RCA lesion, 70% stenosed. 2. Mid Cx lesion, 80% stenosed. 3. Ost LPDA to LPDA lesion, 70% stenosed. 4. SVG was injected . 5. was injected is normal in caliber, and is anatomically normal. 6. Prox LAD to Mid LAD lesion, 100% stenosed. 7. Ost LM to LM lesion, 70% stenosed.   Prolonged procedure, caused by failed attempt at left radial approach due to radial loop and the development of a microperforation due to diagnostic catheter/guidewire injury. Hemostasis was achieved with compression and an Ace bandage.  Saphenous vein graft to the third obtuse marginal is widely patent. The procedure was prolonged because the graft was not marked and required aortography to identify the site of origin.  Widely patent LIMA to LAD.  Nondominant moderately diseased, 70% mid right coronary artery.  Total occlusion of the proximal native LAD.  High-grade obstruction of the mid circumflex between the large first and third obtuse marginal branch. There is also 70% stenosis in the PDA.  Normal left ventricular function and hemodynamics.  Atrial fibrillation developed transiently, lasting approximally 3 minutes. Atrial fibrillation was incited by mechanical irritation during left ventriculography.   RECOMMENDATIONS:   Watch medial  left arm for evidence of bleeding/hematoma.  Monitor femoral site for evidence of bleeding.  Heparin can probably be discontinued and converted to DVT prophylaxis.  _____________   History of Present Illness   Jennifer Warner is a 82 year old Caucasian female with PMH of HTN, HLD, IDDM, CKD stage III and CAD s/p 2v CABG (LIMA to LAD, SVG to PDA) in 2009 in [place], [place]. Her bypass procedure was proceeded by abnormal Myoview. Cardiac catheterization at the time demonstrated critical ostial LAD stenosis. After bypass surgery, she has had a normal Myoview in April 2015. She was last seen by Dr. Martinique in the office on 04/03/2015 at which time the patient was doing well.  In the last 2 weeks, she has been experiencing substernal chest pain, nonradiating, along with shortness of breath more noticeable with exertion. She says her chest discomfort may occur both at rest and with exertion, however, the shortness of breath with exertion is very similar to her previous angina. She says on 04/26/16 she had a particularly bad event with dizziness and shortness of breath. She came into the house with her dog and had to put her "head down" because she was very dizzy. She says she might have lost consciousness for a minute but she is not sure. The entire event lasted roughly 20 minutes. She also described diaphoresis at the same time. She called Dr. Doug Sou office on 05/02/16 and was instructed to come to [facility] ED.   Hospital Course  She was admitted with plans for left heart catheterization given her history of CAD and her symptoms that were her anginal equivalent.   Her left heart  cath report is above. She has patent grafts. There is some high grade obstruction in the mid circumflex between the large first and third obtuse marginal branch. There is also 70% stenosis in the PDA. We will continue medical therapy.   Left radial approach was attempted however, due to radial loop that attempt was failed. She  developed a microperforation, hemostasis was achieve with pressure bandage. Site is stable today, with small level one hematoma. Right femoral access was obtained for the procedure. That site is stable with no hematoma.   She is hypertensive today, however her ARB was on hold. We will resume at discharge and repeat BMP in 2-3 weeks. She will remain on beta blocker therapy and daily ASA. Her LDL this admission was 84, continue statin therapy. Triglycerides were elevated, she is on fenofibrate.  She will follow up with our office in 2-3 weeks. She was seen today by Dr. Martinique and deemed suitable for discharge.   _____________  Discharge Vitals Blood pressure 178/71, pulse 87, temperature 98.5 F (36.9 C), temperature source Oral, resp. rate 19, height 5\' 3"  (1.6 m), weight 147 lb 3.2 oz (66.769 kg), SpO2 95 %.  Filed Weights   05/02/16 1530 05/03/16 0459 05/04/16 0613  Weight: 152 lb 1.9 oz (69 kg) 146 lb 11.2 oz (66.543 kg) 147 lb 3.2 oz (66.769 kg)    Labs & Radiologic Studies     CBC  Recent Labs  05/03/16 0601 05/04/16 0504  WBC 3.8* 4.8  HGB 12.1 11.7*  HCT 36.5 35.6*  MCV 94.1 93.7  PLT 208 [id number]   Basic Metabolic Panel  Recent Labs  05/03/16 0601 05/04/16 0504  NA 139 139  K 3.9 4.1  CL 107 108  CO2 25 22  GLUCOSE 100* 148*  BUN 20 16  CREATININE 1.10* 1.09*  CALCIUM 9.8 10.0   Cardiac Enzymes  Recent Labs  05/02/16 1923 05/02/16 2344 05/03/16 0601  TROPONINI 0.03* <0.03 <0.03   Hemoglobin A1C  Recent Labs  05/02/16 1923  HGBA1C 6.6*   Fasting Lipid Panel  Recent Labs  05/04/16 0504  CHOL 178  HDL 27*  LDLCALC 84  TRIG 337*  CHOLHDL 6.6    Dg Chest 2 View  05/02/2016  CLINICAL DATA:  Shortness of breath for several days EXAM: CHEST  2 VIEW COMPARISON:  Chest radiograph February 07, 2013 and chest CT Mar 05, 2016 FINDINGS: Previously noted cavitary mass in the right upper lobe is no longer appreciable. There is now mild scarring in this area.  In comparison with the prior study, there is now a nodular opacity lateral to the left hilum measuring 1.4 x 0.9 cm. There is a stable small calcified granuloma in the right upper lobe near the apex. The lungs elsewhere clear. Heart size and pulmonary vascularity are normal. No adenopathy. Patient is status post internal mammary bypass grafting. No bone lesions. There are surgical clips in the right upper quadrant. IMPRESSION: Nodular opacity lateral to the left hilum. As this finding is new compared to prior study, noncontrast enhanced chest CT advised to further evaluate. Scarring right upper lobe. Small granuloma right upper lobe which is stable. No edema or consolidation. Stable cardiac silhouette. Electronically Signed   By: Lowella Grip III M.D.   On: 05/02/2016 12:51   Ct Chest Wo Contrast  05/02/2016  CLINICAL DATA:  Exertional chest pain. Shortness of breath, nausea, weakness and dizziness. Occasional syncope. Symptoms have worsened over the past week. EXAM: CT CHEST WITHOUT CONTRAST TECHNIQUE:  Multidetector CT imaging of the chest was performed following the standard protocol without IV contrast. COMPARISON:  PA and lateral chest this same day and 02/07/2013. CT chest 03/05/2013. FINDINGS: Cardiovascular: The patient is status post CABG. Calcific aortic and coronary atherosclerosis is noted. Heart size is normal. No pericardial effusion. Mediastinum/Nodes: No lymphadenopathy is identified. Lungs/Pleura: No pleural effusion. Punctate calcified granuloma right upper lobe is identified. Mild dependent atelectasis is seen. Upper Abdomen: The gallbladder has been removed. Mild fatty infiltration of the liver is seen. Musculoskeletal: Unremarkable. IMPRESSION: No acute cardiopulmonary disease. No finding to explain the patient's symptoms. Extensive calcific aortic and coronary atherosclerosis. The patient is status post CABG. Fatty infiltration of the liver. Electronically Signed   By: Inge Rise  M.D.   On: 05/02/2016 13:47    Disposition   Pt is being discharged home today in good condition.  Follow-up Plans & Appointments    Follow-up Information    Follow up with Cecilie Kicks, NP On 05/25/2016.   Specialties:  Cardiology, Radiology   Why:  at 10:30 for hospital follow up    Contact information:   [place] [id number] [phone]      Discharge Instructions    Diet - low sodium heart healthy    Complete by:  As directed      Discharge instructions    Complete by:  As directed   Groin Site Care Refer to this sheet in the next few weeks. These instructions provide you with information on caring for yourself after your procedure. Your caregiver may also give you more specific instructions. Your treatment has been planned according to current medical practices, but problems sometimes occur. Call your caregiver if you have any problems or questions after your procedure. HOME CARE INSTRUCTIONS You may shower 24 hours after the procedure. Remove the bandage (dressing) and gently wash the site with plain soap and water. Gently pat the site dry.  Do not apply powder or lotion to the site.  Do not sit in a bathtub, swimming pool, or whirlpool for 5 to 7 days.  No bending, squatting, or lifting anything over 10 pounds (4.5 kg) as directed by your caregiver.  Inspect the site at least twice daily.  Do not drive home if you are discharged the same day of the procedure. Have someone else drive you.  You may drive 24 hours after the procedure unless otherwise instructed by your caregiver.  What to expect: Any bruising will usually fade within 1 to 2 weeks.  Blood that collects in the tissue (hematoma) may be painful to the touch. It should usually decrease in size and tenderness within 1 to 2 weeks.  SEEK IMMEDIATE MEDICAL CARE IF: You have unusual pain at the groin site or down the affected leg.  You have redness, warmth, swelling, or pain at the groin  site.  You have drainage (other than a small amount of blood on the dressing).  You have chills.  You have a fever or persistent symptoms for more than 72 hours.  You have a fever and your symptoms suddenly get worse.  Your leg becomes pale, cool, tingly, or numb.  You have heavy bleeding from the site. Hold pressure on the site. .     Increase activity slowly    Complete by:  As directed            Discharge Medications   Current Discharge Medication List    START taking these medications  Details  aspirin EC 81 MG EC tablet Take 1 tablet (81 mg total) by mouth daily.    nitroGLYCERIN (NITROSTAT) 0.4 MG SL tablet Place 1 tablet (0.4 mg total) under the tongue every 5 (five) minutes x 3 doses as needed for chest pain. Qty: 25 tablet, Refills: 2      CONTINUE these medications which have NOT CHANGED   Details  atorvastatin (LIPITOR) 20 MG tablet Take 1 tablet by mouth daily at 6 PM.  Refills: 2    diphenhydramine-acetaminophen (TYLENOL PM) 25-500 MG TABS Take 2 tablets by mouth at bedtime.    fenofibrate (TRICOR) 145 MG tablet TAKE 1 TABLET BY MOUTH EVERY EVENING Qty: 90 tablet, Refills: 2    LANTUS SOLOSTAR 100 UNIT/ML injection Inject 25 Units into the skin at bedtime.     losartan (COZAAR) 100 MG tablet Take 1 tablet (100 mg total) by mouth daily. Please keep your upcoming appointment (04/02/16) for refills. Qty: 90 tablet, Refills: 0    metoprolol tartrate (LOPRESSOR) 25 MG tablet Take 1 tablet (25 mg total) by mouth 2 (two) times daily. Qty: 180 tablet, Refills: 3    omeprazole (PRILOSEC) 20 MG capsule TAKE 1 CAPSULE BY MOUTH EVERY EVENING Qty: 90 capsule, Refills: 3    Vitamin D, Ergocalciferol, (DRISDOL) 50000 units CAPS capsule Take 50,000 Units by mouth 2 (two) times a week. Refills: 1      STOP taking these medications     aspirin 81 MG chewable tablet            Outstanding Labs/Studies   BMP  Duration of Discharge Encounter   Greater than 30  minutes including physician time.  Signed, Arbutus Leas NP 05/04/2016, 10:00 AM Patient seen and examined and history reviewed. Agree with above findings and plan. See my earlier rounding note.  Peter Martinique, [place] 05/04/2016 10:54 AM

## 2016-05-24 NOTE — Progress Notes (Signed)
Cardiology Office Note   Date:  05/25/2016   ID:  Jennifer Warner, DOB Mar 16, 1934, MRN [id number]  PCP:  Foye Spurling, MD  Cardiologist:  Dr. Martinique  Chief Complaint  Patient presents with  . Hospitalization Follow-up    syncope and chest pain      History of Present Illness: Jennifer Warner is a 82 y.o. female who presents for post hospital visit  Was hospitalized 7/17 to 7/19   She has a PMH of HTN, HLD, IDDM, CKD stage III and CAD s/p 2v CABG (LIMA to LAD, SVG to PDA) in 2009 in [place], [place]. Her bypass procedure was proceeded by abnormal Myoview. Cardiac catheterization at the time demonstrated critical ostial LAD stenosis. After bypass surgery, she had a normal Myoview in April 2015. She was last seen by Dr. Martinique in the office on 04/03/2015 at which time she was doing well..  She developed chest pain 2 weeks prior to admit.  She was admitted for cardiac cath.  Her left heart cath report is below. She has patent grafts. There is some high grade obstruction in the mid circumflex between the large first and third obtuse marginal branch. There is also 70% stenosis in the PDA. We will continue medical therapy  She will remain on beta blocker therapy and daily ASA. Her LDL this admission was 84, continue statin therapy. Triglycerides were elevated, she is on fenofibrate.  Today  Rapid HR at times.  Lasts 2-3 min.  We discussed an event monitor but she would prefer to hold off on this.  We discussed that she did have a fib with cath and if episodes occur more freq or last longer she will cal for the monitor.  She has no chest pain.  No SOB no dizziness or lightheadedness. She is walking and is active.   Past Medical History:  Diagnosis Date  . Acute thyroiditis   . CAD (coronary artery disease)   . CAP (community acquired pneumonia) 12/2012  . Essential hypertension, benign   . GERD (gastroesophageal reflux disease)   . History of blood transfusion 05/2008   "when I had my  bypass"  . Hyperkalemia   . Hypertriglyceridemia   . Other secondary thrombocytopenia   . Polycystic ovaries   . Postsurgical aortocoronary bypass status   . Pure hypercholesterolemia   . Type II diabetes mellitus ([place])   . Urinary tract infection, site not specified     Past Surgical History:  Procedure Laterality Date  . CARDIAC CATHETERIZATION  05/2008  . CARDIAC CATHETERIZATION N/A 05/03/2016   Procedure: Left Heart Cath and Coronary Angiography;  Surgeon: Belva Crome, MD;  Location: [facility];  Service: Cardiovascular;  Laterality: N/A;  . CATARACT EXTRACTION W/ INTRAOCULAR LENS  IMPLANT, BILATERAL Bilateral ~ 2000  . [place]  . CORONARY ARTERY BYPASS GRAFT  05/2008   X2, [place] to LAD, SVG to PDA  . DILATION AND CURETTAGE OF UTERUS    . LAPAROSCOPIC CHOLECYSTECTOMY  1990s  . TUBAL LIGATION       Current Outpatient Prescriptions  Medication Sig Dispense Refill  . aspirin EC 81 MG EC tablet Take 1 tablet (81 mg total) by mouth daily.    Marland Kitchen atorvastatin (LIPITOR) 20 MG tablet Take 1 tablet by mouth daily at 6 PM.   2  . diphenhydramine-acetaminophen (TYLENOL PM) 25-500 MG TABS Take 2 tablets by mouth at bedtime.    . fenofibrate (TRICOR) 145 MG tablet TAKE 1 TABLET  BY MOUTH EVERY EVENING 90 tablet 2  . LANTUS SOLOSTAR 100 UNIT/ML injection Inject 25 Units into the skin at bedtime.     Marland Kitchen losartan (COZAAR) 100 MG tablet Take 1 tablet (100 mg total) by mouth daily. Please keep your upcoming appointment (04/02/16) for refills. 90 tablet 0  . metoprolol tartrate (LOPRESSOR) 25 MG tablet Take 1 tablet (25 mg total) by mouth 2 (two) times daily. 180 tablet 3  . nitroGLYCERIN (NITROSTAT) 0.4 MG SL tablet Place 1 tablet (0.4 mg total) under the tongue every 5 (five) minutes x 3 doses as needed for chest pain. 25 tablet 2  . omeprazole (PRILOSEC) 20 MG capsule TAKE 1 CAPSULE BY MOUTH EVERY EVENING 90 capsule 3  . Vitamin D, Ergocalciferol, (DRISDOL) 50000 units CAPS  capsule Take 50,000 Units by mouth 2 (two) times a week.  1   No current facility-administered medications for this visit.     Allergies:   Levofloxacin    Social History:  The patient  reports that she has never smoked. She has never used smokeless tobacco. She reports that she does not drink alcohol or use drugs.   Family History:  The patient's family history includes Cancer in her brother; Liver disease (age of onset: 8) in her mother; Stroke in her brother.    ROS:  General:no colds or fevers, no weight changes Skin:no rashes or ulcers HEENT:no blurred vision, no congestion CV:see HPI PUL:see HPI GI:no diarrhea constipation or melena, no indigestion GU:no hematuria, no dysuria MS:no joint pain, no claudication Neuro:no syncope since admit and she had not eaten that day after syncope she did eat and felt better.  , no lightheadedness Endo:+ diabetes has been stable, no thyroid disease   Wt Readings from Last 3 Encounters:  05/25/16 145 lb 12.8 oz (66.1 kg)  05/04/16 147 lb 3.2 oz (66.8 kg)  04/03/15 152 lb 1.6 oz (69 kg)     PHYSICAL EXAM: VS:  BP 120/72   Pulse 65   Ht 5\' 2"  (1.575 m)   Wt 145 lb 12.8 oz (66.1 kg)   BMI 26.67 kg/m  , BMI Body mass index is 26.67 kg/m. General:Pleasant affect, NAD Skin:Warm and dry, brisk capillary refill HEENT:normocephalic, sclera clear, mucus membranes moist Neck:supple, no JVD, no bruits  Heart:S1S2 RRR without murmur, gallup, rub or click Lungs:clear without rales, rhonchi, or wheezes [id number], non tender, + BS, do not palpate liver spleen or masses Ext:no lower ext edema, 2+ pedal pulses, 2+ radial pulses Neuro:alert and oriented, MAE, follows commands, + facial symmetry    EKG:  EKG is ordered today. The ekg ordered today demonstrates SR at 65 no acute changes.     Recent Labs: 05/04/2016: Hemoglobin 11.7; Platelets 209 05/25/2016: BUN 29; Creat 1.29; Potassium 5.4; Sodium 139    Lipid Panel    Component Value  Date/Time   CHOL 178 05/04/2016 0504   TRIG 337 (H) 05/04/2016 0504   HDL 27 (L) 05/04/2016 0504   CHOLHDL 6.6 05/04/2016 0504   VLDL 67 (H) 05/04/2016 0504   LDLCALC 84 05/04/2016 0504       Other studies Reviewed: Additional studies/ records that were reviewed today include: .  05/03/16 cardiac cath: Conclusion   1. Prox RCA to Mid RCA lesion, 70% stenosed. 2. Mid Cx lesion, 80% stenosed. 3. Ost LPDA to LPDA lesion, 70% stenosed. 4. SVG was injected . 5. was injected is normal in caliber, and is anatomically normal. 6. Prox LAD to Mid LAD lesion, 100%  stenosed. 7. Ost LM to LM lesion, 70% stenosed.    Prolonged procedure, caused by failed attempt at left radial approach due to radial loop and the development of a microperforation due to diagnostic catheter/guidewire injury. Hemostasis was achieved with compression and an Ace bandage.  Saphenous vein graft to the third obtuse marginal is widely patent. The procedure was prolonged because the graft was not marked and required aortography to identify the site of origin.  Widely patent LIMA to LAD.  Nondominant moderately diseased, 70% mid right coronary artery.  Total occlusion of the proximal native LAD.  High-grade obstruction of the mid circumflex between the large first and third obtuse marginal branch. There is also 70% stenosis in the PDA.  Normal left ventricular function and hemodynamics.  Atrial fibrillation developed transiently, lasting approximally 3 minutes. Atrial fibrillation was incited by mechanical irritation during left ventriculography.   RECOMMENDATIONS:   Watch medial left arm for evidence of bleeding/hematoma.  Monitor femoral site for evidence of bleeding.  Heparin can probably be discontinued and converted to DVT prophylaxis.     ASSESSMENT AND PLAN:  1. CAD (coronary artery disease)with cath as above, medical therapy  Keep follow up with Dr. Martinique.  2. Syncope- no further  episodes, may have been due to hypoglycemia   3. PAF for 2-3 min with cath- pt also notes 2-3 min episodes of rapid HR at home.  She prefers to hold off on monitor unless more freq or last longer.   4.    Hypertriglyceridemia  5.   Essential hypertension, benign  6.   S/P CABG x 2  7.  Type 2 diabetes mellitus (HCC)      Angina pectoris ([place] Current medicines are reviewed with the patient today.  The patient Has no concerns regarding medicines.  The following changes have been made:  See above Labs/ tests ordered today include:see above  Disposition:   FU:  see above  Signed, Cecilie Kicks, NP  05/25/2016 10:26 PM    [facility] [place] [place], [place] Phone: [phone]; Fax: [phone]

## 2016-05-25 ENCOUNTER — Encounter (INDEPENDENT_AMBULATORY_CARE_PROVIDER_SITE_OTHER): Payer: Self-pay

## 2016-05-25 ENCOUNTER — Encounter: Payer: Self-pay | Admitting: Cardiology

## 2016-05-25 ENCOUNTER — Ambulatory Visit (INDEPENDENT_AMBULATORY_CARE_PROVIDER_SITE_OTHER): Payer: Medicare Other | Admitting: Cardiology

## 2016-05-25 VITALS — BP 120/72 | HR 65 | Ht 62.0 in | Wt 145.8 lb

## 2016-05-25 DIAGNOSIS — I480 Paroxysmal atrial fibrillation: Secondary | ICD-10-CM | POA: Diagnosis not present

## 2016-05-25 DIAGNOSIS — I10 Essential (primary) hypertension: Secondary | ICD-10-CM | POA: Diagnosis not present

## 2016-05-25 DIAGNOSIS — N181 Chronic kidney disease, stage 1: Secondary | ICD-10-CM

## 2016-05-25 DIAGNOSIS — I25708 Atherosclerosis of coronary artery bypass graft(s), unspecified, with other forms of angina pectoris: Secondary | ICD-10-CM

## 2016-05-25 DIAGNOSIS — I209 Angina pectoris, unspecified: Secondary | ICD-10-CM

## 2016-05-25 DIAGNOSIS — E0822 Diabetes mellitus due to underlying condition with diabetic chronic kidney disease: Secondary | ICD-10-CM

## 2016-05-25 DIAGNOSIS — Z951 Presence of aortocoronary bypass graft: Secondary | ICD-10-CM | POA: Diagnosis not present

## 2016-05-25 DIAGNOSIS — R000 Tachycardia, unspecified: Secondary | ICD-10-CM

## 2016-05-25 LAB — BASIC METABOLIC PANEL
BUN: 29 mg/dL — AB (ref 7–25)
CALCIUM: 10.8 mg/dL — AB (ref 8.6–10.4)
CO2: 25 mmol/L (ref 20–31)
Chloride: 106 mmol/L (ref 98–110)
Creat: 1.29 mg/dL — ABNORMAL HIGH (ref 0.60–0.93)
GLUCOSE: 121 mg/dL — AB (ref 65–99)
Potassium: 5.4 mmol/L — ABNORMAL HIGH (ref 3.5–5.3)
SODIUM: 139 mmol/L (ref 135–146)

## 2016-05-25 NOTE — Patient Instructions (Signed)
Medication Instructions: Your physician recommends that you continue on your current medications as directed. Please refer to the Current Medication list given to you today.   Labwork: TODAY : BMET  Procedures/Testing: NONE  Follow-Up: Your physician recommends that you keep your scheduled follow up with Dr. Martinique 07/25/16   Any Additional Special Instructions Will Be Listed Below (If Applicable).     If you need a refill on your cardiac medications before your next appointment, please call your pharmacy.

## 2016-05-26 ENCOUNTER — Telehealth: Payer: Self-pay | Admitting: *Deleted

## 2016-05-26 DIAGNOSIS — I10 Essential (primary) hypertension: Secondary | ICD-10-CM

## 2016-05-26 NOTE — Telephone Encounter (Signed)
-----   Message from Isaiah Serge, NP sent at 05/25/2016  5:01 PM EDT ----- Hold cozaar -- the potasium and kidney function are elevated.  Increase fluids and recheck CMP on Friday along with BP check.  Thanks.

## 2016-05-26 NOTE — Telephone Encounter (Signed)
Pt aware of lab results.  She will hold Losartan and come in 05/28/16 for repeat cmp and have someone recheck bp. Pt agreeable with this plan. Order for CMP in epic.

## 2016-05-27 ENCOUNTER — Other Ambulatory Visit: Payer: Medicare Other | Admitting: *Deleted

## 2016-05-27 ENCOUNTER — Telehealth: Payer: Self-pay | Admitting: *Deleted

## 2016-05-27 DIAGNOSIS — I10 Essential (primary) hypertension: Secondary | ICD-10-CM | POA: Diagnosis not present

## 2016-05-27 LAB — COMPREHENSIVE METABOLIC PANEL
ALT: 15 U/L (ref 6–29)
AST: 25 U/L (ref 10–35)
Albumin: 4 g/dL (ref 3.6–5.1)
Alkaline Phosphatase: 35 U/L (ref 33–130)
BUN: 27 mg/dL — AB (ref 7–25)
CALCIUM: 9.9 mg/dL (ref 8.6–10.4)
CHLORIDE: 105 mmol/L (ref 98–110)
CO2: 24 mmol/L (ref 20–31)
Creat: 1.15 mg/dL — ABNORMAL HIGH (ref 0.60–0.93)
GLUCOSE: 177 mg/dL — AB (ref 65–99)
POTASSIUM: 4.4 mmol/L (ref 3.5–5.3)
Sodium: 140 mmol/L (ref 135–146)
Total Bilirubin: 0.5 mg/dL (ref 0.2–1.2)
Total Protein: 6.5 g/dL (ref 6.1–8.1)

## 2016-05-27 NOTE — Telephone Encounter (Signed)
Ok great. Thanks!

## 2016-05-27 NOTE — Telephone Encounter (Signed)
Lab tech delivered a piece of paper to triage at this time. Pt had come in for lab work, and was to have her BP checked today while here, but she did it herself at home before coming to office.  Her BP was 132/74, HR 75 and CBS is 126.  Forwarding to Mickel Baas to review/advise further if necessary.

## 2016-07-12 ENCOUNTER — Telehealth: Payer: Self-pay | Admitting: Cardiology

## 2016-07-12 NOTE — Telephone Encounter (Signed)
New message     *STAT* If patient is at the pharmacy, call can be transferred to refill team.   1. Which medications need to be refilled? (please list name of each medication and dose if known) losartin 100mg   2. Which pharmacy/location (including street and city if local pharmacy) is medication to be sent to? [facility] on [address]  3. Do they need a 30 day or 90 day supply? [place]

## 2016-07-13 NOTE — Telephone Encounter (Signed)
Returned call to patient.Advised to continue to hold Losartan as ordered.Advised to monitor B/P and bring readings to appointment with Dr.Jordan 07/25/16 at 1:45 pm.

## 2016-07-14 ENCOUNTER — Other Ambulatory Visit: Payer: Self-pay | Admitting: Cardiology

## 2016-07-14 NOTE — Telephone Encounter (Signed)
Rx request sent to pharmacy.  

## 2016-07-24 NOTE — Progress Notes (Signed)
Jennifer Warner. [name] Date of Birth: December 15, 1933 Medical Record [id number]  History of Present Illness: [name] is seen for followup of CAD. She  has a history of hypertension and dyslipidemia. She also has a history of diabetes- on insulin. In 2009 she presented with new onset of angina. A Myoview study was abnormal. This led to cardiac catheterization which demonstrated a critical ostial LAD stenosis. She  underwent two-vessel coronary bypass surgery in [place] with an LIMA graft to the LAD and a saphenous vein graft to the RCA. She had a normal Myoview study in April 2015.  In July 2017 she presented with chest pain and underwent a cardiac cath. She has patent grafts. There is some high grade obstruction in the mid circumflex between the large first and third obtuse marginal branch. There is also 70% stenosis in the PDA. She was continued on medical therapy. When seen in August she noted some intermittent palpitations but deferred event monitor at that time. She has done very well over the past year since then [facility] She reports BP readings at home have been good. We stopped losartan in August due to elevated creatinine and potassium. These improved. No chest pain or SOB. Denies palpitations now.  Current Outpatient Prescriptions on File Prior to Visit  Medication Sig Dispense Refill  . aspirin EC 81 MG EC tablet Take 1 tablet (81 mg total) by mouth daily.    Marland Kitchen atorvastatin (LIPITOR) 20 MG tablet Take 1 tablet by mouth daily at 6 PM.   2  . diphenhydramine-acetaminophen (TYLENOL PM) 25-500 MG TABS Take 2 tablets by mouth at bedtime.    . fenofibrate (TRICOR) 145 MG tablet TAKE 1 TABLET BY MOUTH EVERY EVENING 90 tablet 2  . LANTUS SOLOSTAR 100 UNIT/ML injection Inject 25 Units into the skin at bedtime.     . metoprolol tartrate (LOPRESSOR) 25 MG tablet Take 1 tablet (25 mg total) by mouth 2 (two) times daily. 180 tablet 3  . nitroGLYCERIN (NITROSTAT) 0.4  MG SL tablet Place 1 tablet (0.4 mg total) under the tongue every 5 (five) minutes x 3 doses as needed for chest pain. 25 tablet 2  . omeprazole (PRILOSEC) 20 MG capsule TAKE 1 CAPSULE BY MOUTH EVERY EVENING 90 capsule 3  . Vitamin D, Ergocalciferol, (DRISDOL) 50000 units CAPS capsule Take 50,000 Units by mouth 2 (two) times a week.  1   No current facility-administered medications on file prior to visit.     Allergies  Allergen Reactions  . Levofloxacin     nausea vommiting    Past Medical History:  Diagnosis Date  . Acute thyroiditis   . CAD (coronary artery disease)   . CAP (community acquired pneumonia) 12/2012  . Essential hypertension, benign   . GERD (gastroesophageal reflux disease)   . History of blood transfusion 05/2008   "when I had my bypass"  . Hyperkalemia   . Hypertriglyceridemia   . Other secondary thrombocytopenia   . Polycystic ovaries   . Postsurgical aortocoronary bypass status   . Pure hypercholesterolemia   . Type II diabetes mellitus ([place])   . Urinary tract infection, site not specified     Past Surgical History:  Procedure Laterality Date  . CARDIAC CATHETERIZATION  05/2008  . CARDIAC CATHETERIZATION N/A 05/03/2016   Procedure: Left Heart Cath and Coronary Angiography;  Surgeon: Jennifer Crome, MD;  Location: [facility];  Service: Cardiovascular;  Laterality: N/A;  . CATARACT EXTRACTION  W/ INTRAOCULAR LENS  IMPLANT, BILATERAL Bilateral ~ 2000  . [place]  . CORONARY ARTERY BYPASS GRAFT  05/2008   X2, [place] to LAD, SVG to PDA  . DILATION AND CURETTAGE OF UTERUS    . LAPAROSCOPIC CHOLECYSTECTOMY  1990s  . TUBAL LIGATION      History  Smoking Status  . Never Smoker  Smokeless Tobacco  . Never Used    History  Alcohol Use No    Family History  Problem Relation Age of Onset  . Liver disease Mother 45    deceased  . Cancer Brother     x3  . Stroke Brother     Review of Systems: As noted in history of present illness   All other systems were reviewed and are negative.  Physical Exam: BP 120/72   Pulse 62   Ht 5\' 3"  (1.6 m)   Wt 146 lb 6.4 oz (66.4 kg)   BMI 25.93 kg/m  She is very pleasant, obese white female in no acute distress. HEENT: Normal Neck: No JVD or bruits. Lungs: Clear Cardiovascular: Regular rate and rhythm, normal S1-2, without gallop, murmur, or click. Abdomen: Soft and nontender. No masses or bruits. Extremities: No cyanosis or edema. Pedal pulses are 2+ and symmetric. Skin: Warm and dry Neuro: Alert and oriented x3. Cranial nerves II through XII are intact.  LABORATORY DATA:  Lab Results  Component Value Date   WBC 4.8 05/04/2016   HGB 11.7 (L) 05/04/2016   HCT 35.6 (L) 05/04/2016   PLT 209 05/04/2016   GLUCOSE 177 (H) 05/27/2016   CHOL 178 05/04/2016   TRIG 337 (H) 05/04/2016   HDL 27 (L) 05/04/2016   LDLCALC 84 05/04/2016   ALT 15 05/27/2016   AST 25 05/27/2016   NA 140 05/27/2016   K 4.4 05/27/2016   CL 105 05/27/2016   CREATININE 1.15 (H) 05/27/2016   BUN 27 (H) 05/27/2016   CO2 24 05/27/2016   INR 1.13 05/03/2016   HGBA1C 6.6 (H) 05/02/2016   Cardiac cath 05/03/16: Conclusion   1. Prox RCA to Mid RCA lesion, 70% stenosed. 2. Mid Cx lesion, 80% stenosed. 3. Ost LPDA to LPDA lesion, 70% stenosed. 4. SVG was injected . 5. was injected is normal in caliber, and is anatomically normal. 6. Prox LAD to Mid LAD lesion, 100% stenosed. 7. Ost LM to LM lesion, 70% stenosed.    Prolonged procedure, caused by failed attempt at left radial approach due to radial loop and the development of a microperforation due to diagnostic catheter/guidewire injury. Hemostasis was achieved with compression and an Ace bandage.  Saphenous vein graft to the third obtuse marginal is widely patent. The procedure was prolonged because the graft was not marked and required aortography to identify the site of origin.  Widely patent LIMA to LAD.  Nondominant moderately diseased, 70%  mid right coronary artery.  Total occlusion of the proximal native LAD.  High-grade obstruction of the mid circumflex between the large first and third obtuse marginal branch. There is also 70% stenosis in the PDA.  Normal left ventricular function and hemodynamics.  Atrial fibrillation developed transiently, lasting approximally 3 minutes. Atrial fibrillation was incited by mechanical irritation during left ventriculography.   RECOMMENDATIONS:   Watch medial left arm for evidence of bleeding/hematoma.  Monitor femoral site for evidence of bleeding.  Heparin can probably be discontinued and converted to DVT prophylaxis.     Assessment / Plan: 1. Coronary disease status post 2 vessel  CABG in August of 2009. She is asymptomatic. Cardiac cath in July 2017 showed patent grafts. We'll continue with her current medical therapy and risk factor modification. Myoview study normal April 2015.   2. Hyperlipidemia. Continue TriCor and Lipitor.   3. Diabetes mellitus type 2.  3. Chronic kidney disease stage III. ARB stopped due to hyperkalemia and elevated creatinine.  4. HTN. BP is well controlled.   5. Paroxysmal Afib noted during cardiac cath.

## 2016-07-25 ENCOUNTER — Ambulatory Visit (INDEPENDENT_AMBULATORY_CARE_PROVIDER_SITE_OTHER): Payer: Medicare Other | Admitting: Cardiology

## 2016-07-25 ENCOUNTER — Encounter: Payer: Self-pay | Admitting: Cardiology

## 2016-07-25 VITALS — BP 120/72 | HR 62 | Ht 63.0 in | Wt 146.4 lb

## 2016-07-25 DIAGNOSIS — E782 Mixed hyperlipidemia: Secondary | ICD-10-CM

## 2016-07-25 DIAGNOSIS — I209 Angina pectoris, unspecified: Secondary | ICD-10-CM

## 2016-07-25 DIAGNOSIS — I25708 Atherosclerosis of coronary artery bypass graft(s), unspecified, with other forms of angina pectoris: Secondary | ICD-10-CM | POA: Diagnosis not present

## 2016-07-25 DIAGNOSIS — Z951 Presence of aortocoronary bypass graft: Secondary | ICD-10-CM

## 2016-07-25 DIAGNOSIS — I10 Essential (primary) hypertension: Secondary | ICD-10-CM

## 2016-07-25 NOTE — Patient Instructions (Signed)
Continue your current therapy  Stay off losartan  I will see you in 6  Months.

## 2016-08-18 DIAGNOSIS — E119 Type 2 diabetes mellitus without complications: Secondary | ICD-10-CM | POA: Diagnosis not present

## 2016-08-18 DIAGNOSIS — I10 Essential (primary) hypertension: Secondary | ICD-10-CM | POA: Diagnosis not present

## 2016-08-18 DIAGNOSIS — I2510 Atherosclerotic heart disease of native coronary artery without angina pectoris: Secondary | ICD-10-CM | POA: Diagnosis not present

## 2016-10-04 DIAGNOSIS — E119 Type 2 diabetes mellitus without complications: Secondary | ICD-10-CM | POA: Diagnosis not present

## 2016-10-04 DIAGNOSIS — I2510 Atherosclerotic heart disease of native coronary artery without angina pectoris: Secondary | ICD-10-CM | POA: Diagnosis not present

## 2016-10-04 DIAGNOSIS — I10 Essential (primary) hypertension: Secondary | ICD-10-CM | POA: Diagnosis not present

## 2016-12-29 DIAGNOSIS — L570 Actinic keratosis: Secondary | ICD-10-CM | POA: Diagnosis not present

## 2016-12-29 DIAGNOSIS — D0439 Carcinoma in situ of skin of other parts of face: Secondary | ICD-10-CM | POA: Diagnosis not present

## 2016-12-29 DIAGNOSIS — D485 Neoplasm of uncertain behavior of skin: Secondary | ICD-10-CM | POA: Diagnosis not present

## 2016-12-29 DIAGNOSIS — L820 Inflamed seborrheic keratosis: Secondary | ICD-10-CM | POA: Diagnosis not present

## 2017-01-11 DIAGNOSIS — D0439 Carcinoma in situ of skin of other parts of face: Secondary | ICD-10-CM | POA: Diagnosis not present

## 2017-01-11 DIAGNOSIS — L570 Actinic keratosis: Secondary | ICD-10-CM | POA: Diagnosis not present

## 2017-01-24 DIAGNOSIS — E119 Type 2 diabetes mellitus without complications: Secondary | ICD-10-CM | POA: Diagnosis not present

## 2017-01-24 DIAGNOSIS — I10 Essential (primary) hypertension: Secondary | ICD-10-CM | POA: Diagnosis not present

## 2017-01-24 DIAGNOSIS — E559 Vitamin D deficiency, unspecified: Secondary | ICD-10-CM | POA: Diagnosis not present

## 2017-01-24 DIAGNOSIS — E781 Pure hyperglyceridemia: Secondary | ICD-10-CM | POA: Diagnosis not present

## 2017-01-24 DIAGNOSIS — I2510 Atherosclerotic heart disease of native coronary artery without angina pectoris: Secondary | ICD-10-CM | POA: Diagnosis not present

## 2017-02-22 DIAGNOSIS — Z85828 Personal history of other malignant neoplasm of skin: Secondary | ICD-10-CM | POA: Diagnosis not present

## 2017-02-22 DIAGNOSIS — L821 Other seborrheic keratosis: Secondary | ICD-10-CM | POA: Diagnosis not present

## 2017-02-23 ENCOUNTER — Encounter: Payer: Self-pay | Admitting: *Deleted

## 2017-03-18 NOTE — Progress Notes (Signed)
Jennifer Warner. Jennifer Warner Date of Birth: Jun 02, 1934 Medical Record #[id number]  History of Present Illness: [name] is seen for followup of CAD. She  has a history of hypertension and dyslipidemia. She also has a history of diabetes- on insulin. In 2009 she presented with new onset of angina. A Myoview study was abnormal. This led to cardiac catheterization which demonstrated a critical ostial LAD stenosis. She  underwent two-vessel coronary bypass surgery in [place] with an LIMA graft to the LAD and a saphenous vein graft to the RCA. She had a normal Myoview study in April 2015.  In July 2017 she presented with chest pain and underwent a cardiac cath. She had a left radial loop. She has patent grafts. There is some high grade obstruction in the mid circumflex between the large first and third obtuse marginal branch. There is also 70% stenosis in the PDA. She was continued on medical therapy. When seen in August she noted some intermittent palpitations but deferred event monitor at that time.   On follow up today she is doing very well. Has been the primary caregiver for her husband who has been quite ill this year.  We stopped losartan previously due to elevated creatinine and potassium. BP at home has been in excellent control and she brings a diary today. Sugars have been good. Last A1c 6.4%. She denies any chest pain or dyspnea. No edema. She had some dermatology lesions removed.   Current Outpatient Prescriptions on File Prior to Visit  Medication Sig Dispense Refill  . aspirin EC 81 MG EC tablet Take 1 tablet (81 mg total) by mouth daily.    Marland Kitchen atorvastatin (LIPITOR) 20 MG tablet Take 1 tablet by mouth daily at 6 PM.   2  . diphenhydramine-acetaminophen (TYLENOL PM) 25-500 MG TABS Take 2 tablets by mouth at bedtime.    . fenofibrate (TRICOR) 145 MG tablet TAKE 1 TABLET BY MOUTH EVERY EVENING 90 tablet 2  . LANTUS SOLOSTAR 100 UNIT/ML injection Inject 25 Units into the skin at bedtime.      . metoprolol tartrate (LOPRESSOR) 25 MG tablet Take 1 tablet (25 mg total) by mouth 2 (two) times daily. 180 tablet 3  . nitroGLYCERIN (NITROSTAT) 0.4 MG SL tablet Place 1 tablet (0.4 mg total) under the tongue every 5 (five) minutes x 3 doses as needed for chest pain. 25 tablet 2  . omeprazole (PRILOSEC) 20 MG capsule TAKE 1 CAPSULE BY MOUTH EVERY EVENING 90 capsule 3  . Vitamin D, Ergocalciferol, (DRISDOL) 50000 units CAPS capsule Take 50,000 Units by mouth 2 (two) times a week.  1   No current facility-administered medications on file prior to visit.     Allergies  Allergen Reactions  . Levofloxacin     nausea vommiting    Past Medical History:  Diagnosis Date  . Acute thyroiditis   . CAD (coronary artery disease)   . CAP (community acquired pneumonia) 12/2012  . Essential hypertension, benign   . GERD (gastroesophageal reflux disease)   . History of blood transfusion 05/2008   "when I had my bypass"  . Hyperkalemia   . Hypertriglyceridemia   . Other secondary thrombocytopenia   . Polycystic ovaries   . Postsurgical aortocoronary bypass status   . Pure hypercholesterolemia   . Type II diabetes mellitus ([place])   . Urinary tract infection, site not specified     Past Surgical History:  Procedure Laterality Date  . CARDIAC CATHETERIZATION  05/2008  . CARDIAC CATHETERIZATION N/A  05/03/2016   Procedure: Left Heart Cath and Coronary Angiography;  Surgeon: Jennifer Crome, MD;  Location: [facility];  Service: Cardiovascular;  Laterality: N/A;  . CATARACT EXTRACTION W/ INTRAOCULAR LENS  IMPLANT, BILATERAL Bilateral ~ 2000  . [place]  . CORONARY ARTERY BYPASS GRAFT  05/2008   X2, [place] to LAD, SVG to PDA  . DILATION AND CURETTAGE OF UTERUS    . LAPAROSCOPIC CHOLECYSTECTOMY  1990s  . TUBAL LIGATION      History  Smoking Status  . Never Smoker  Smokeless Tobacco  . Never Used    History  Alcohol Use No    Family History  Problem Relation Age of  Onset  . Liver disease Mother 6       deceased  . Cancer Brother        x3  . Stroke Brother     Review of Systems: As noted in history of present illness  All other systems were reviewed and are negative.  Physical Exam: BP (!) 143/67   Pulse 67   Ht 5\' 3"  (1.6 m)   Wt 145 lb 6.4 oz (66 kg)   BMI 25.76 kg/m  She is very pleasant, obese white female in no acute distress. HEENT: Normal Neck: No JVD or bruits. Lungs: Clear Cardiovascular: Regular rate and rhythm, normal S1-2, without gallop, murmur, or click. Abdomen: Soft and nontender. No masses or bruits. Extremities: No cyanosis or edema. Pedal pulses are 2+ and symmetric. Skin: Warm and dry Neuro: Alert and oriented x3. Cranial nerves II through XII are intact.  LABORATORY DATA:  Lab Results  Component Value Date   WBC 4.8 05/04/2016   HGB 11.7 (L) 05/04/2016   HCT 35.6 (L) 05/04/2016   PLT 209 05/04/2016   GLUCOSE 177 (H) 05/27/2016   CHOL 178 05/04/2016   TRIG 337 (H) 05/04/2016   HDL 27 (L) 05/04/2016   LDLCALC 84 05/04/2016   ALT 15 05/27/2016   AST 25 05/27/2016   NA 140 05/27/2016   K 4.4 05/27/2016   CL 105 05/27/2016   CREATININE 1.15 (H) 05/27/2016   BUN 27 (H) 05/27/2016   CO2 24 05/27/2016   INR 1.13 05/03/2016   HGBA1C 6.6 (H) 05/02/2016    Assessment / Plan: 1. Coronary disease status post 2 vessel CABG in August of 2009. She is asymptomatic. Cardiac cath in July 2017 showed patent grafts. We'll continue with her current medical therapy and risk factor modification. Myoview study normal April 2015.   2. Hyperlipidemia. Continue TriCor and Lipitor. Labs done by Dr. Carlis Warner. I have requested a copy.   3. Diabetes mellitus type 2. Good control.   3. Chronic kidney disease stage III. ARB stopped due to hyperkalemia and elevated creatinine.  4. HTN. BP is well controlled.   5. Paroxysmal Afib noted during cardiac cath. No recurrence.  Continue current therapy. I will follow up in 6  months.

## 2017-03-20 ENCOUNTER — Encounter: Payer: Self-pay | Admitting: Cardiology

## 2017-03-20 ENCOUNTER — Ambulatory Visit (INDEPENDENT_AMBULATORY_CARE_PROVIDER_SITE_OTHER): Payer: Medicare Other | Admitting: Cardiology

## 2017-03-20 VITALS — BP 143/67 | HR 67 | Ht 63.0 in | Wt 145.4 lb

## 2017-03-20 DIAGNOSIS — I25708 Atherosclerosis of coronary artery bypass graft(s), unspecified, with other forms of angina pectoris: Secondary | ICD-10-CM | POA: Diagnosis not present

## 2017-03-20 DIAGNOSIS — Z951 Presence of aortocoronary bypass graft: Secondary | ICD-10-CM

## 2017-03-20 DIAGNOSIS — I10 Essential (primary) hypertension: Secondary | ICD-10-CM | POA: Diagnosis not present

## 2017-03-20 DIAGNOSIS — E782 Mixed hyperlipidemia: Secondary | ICD-10-CM | POA: Diagnosis not present

## 2017-03-20 DIAGNOSIS — I209 Angina pectoris, unspecified: Secondary | ICD-10-CM

## 2017-03-20 NOTE — Patient Instructions (Signed)
Continue your current therapy  I will see you in 6 months.   

## 2017-05-11 DIAGNOSIS — I2510 Atherosclerotic heart disease of native coronary artery without angina pectoris: Secondary | ICD-10-CM | POA: Diagnosis not present

## 2017-05-11 DIAGNOSIS — E119 Type 2 diabetes mellitus without complications: Secondary | ICD-10-CM | POA: Diagnosis not present

## 2017-05-11 DIAGNOSIS — Z0001 Encounter for general adult medical examination with abnormal findings: Secondary | ICD-10-CM | POA: Diagnosis not present

## 2017-05-11 DIAGNOSIS — I10 Essential (primary) hypertension: Secondary | ICD-10-CM | POA: Diagnosis not present

## 2017-05-16 ENCOUNTER — Other Ambulatory Visit: Payer: Self-pay | Admitting: Internal Medicine

## 2017-05-16 DIAGNOSIS — Z1231 Encounter for screening mammogram for malignant neoplasm of breast: Secondary | ICD-10-CM

## 2017-05-16 DIAGNOSIS — E2839 Other primary ovarian failure: Secondary | ICD-10-CM

## 2017-06-15 ENCOUNTER — Ambulatory Visit
Admission: RE | Admit: 2017-06-15 | Discharge: 2017-06-15 | Disposition: A | Discharge status: Home / Self Care | Payer: Medicare Other | Source: Ambulatory Visit | Attending: Internal Medicine | Admitting: Internal Medicine

## 2017-06-15 DIAGNOSIS — M8589 Other specified disorders of bone density and structure, multiple sites: Secondary | ICD-10-CM | POA: Diagnosis not present

## 2017-06-15 DIAGNOSIS — Z1231 Encounter for screening mammogram for malignant neoplasm of breast: Secondary | ICD-10-CM | POA: Diagnosis not present

## 2017-06-15 DIAGNOSIS — Z780 Asymptomatic menopausal state: Secondary | ICD-10-CM | POA: Diagnosis not present

## 2017-06-15 DIAGNOSIS — E2839 Other primary ovarian failure: Secondary | ICD-10-CM

## 2017-07-14 ENCOUNTER — Other Ambulatory Visit: Payer: Self-pay | Admitting: *Deleted

## 2017-07-14 DIAGNOSIS — H26493 Other secondary cataract, bilateral: Secondary | ICD-10-CM | POA: Diagnosis not present

## 2017-07-14 DIAGNOSIS — H02839 Dermatochalasis of unspecified eye, unspecified eyelid: Secondary | ICD-10-CM | POA: Diagnosis not present

## 2017-07-14 DIAGNOSIS — H43813 Vitreous degeneration, bilateral: Secondary | ICD-10-CM | POA: Diagnosis not present

## 2017-07-14 DIAGNOSIS — E119 Type 2 diabetes mellitus without complications: Secondary | ICD-10-CM | POA: Diagnosis not present

## 2017-07-14 MED ORDER — OMEPRAZOLE 20 MG PO CPDR: 20.0000 mg | DELAYED_RELEASE_CAPSULE | Freq: Every evening | ORAL | 3 refills | Status: DC

## 2017-08-14 ENCOUNTER — Inpatient Hospital Stay (HOSPITAL_COMMUNITY)
Admission: EM | Admit: 2017-08-14 | Discharge: 2017-08-17 | DRG: 175 | Disposition: A | Discharge status: Home / Self Care | Payer: Medicare Other | Attending: Family Medicine | Admitting: Family Medicine

## 2017-08-14 ENCOUNTER — Emergency Department (HOSPITAL_COMMUNITY): Payer: Medicare Other

## 2017-08-14 ENCOUNTER — Encounter (HOSPITAL_COMMUNITY): Payer: Self-pay | Admitting: Emergency Medicine

## 2017-08-14 ENCOUNTER — Inpatient Hospital Stay (HOSPITAL_COMMUNITY): Payer: Medicare Other

## 2017-08-14 DIAGNOSIS — I10 Essential (primary) hypertension: Secondary | ICD-10-CM | POA: Diagnosis present

## 2017-08-14 DIAGNOSIS — I2609 Other pulmonary embolism with acute cor pulmonale: Secondary | ICD-10-CM | POA: Diagnosis not present

## 2017-08-14 DIAGNOSIS — J9601 Acute respiratory failure with hypoxia: Secondary | ICD-10-CM | POA: Diagnosis present

## 2017-08-14 DIAGNOSIS — Z881 Allergy status to other antibiotic agents status: Secondary | ICD-10-CM | POA: Diagnosis not present

## 2017-08-14 DIAGNOSIS — R000 Tachycardia, unspecified: Secondary | ICD-10-CM | POA: Diagnosis present

## 2017-08-14 DIAGNOSIS — R748 Abnormal levels of other serum enzymes: Secondary | ICD-10-CM | POA: Diagnosis not present

## 2017-08-14 DIAGNOSIS — R778 Other specified abnormalities of plasma proteins: Secondary | ICD-10-CM | POA: Diagnosis present

## 2017-08-14 DIAGNOSIS — E1122 Type 2 diabetes mellitus with diabetic chronic kidney disease: Secondary | ICD-10-CM | POA: Diagnosis present

## 2017-08-14 DIAGNOSIS — W19XXXA Unspecified fall, initial encounter: Secondary | ICD-10-CM | POA: Diagnosis present

## 2017-08-14 DIAGNOSIS — E785 Hyperlipidemia, unspecified: Secondary | ICD-10-CM | POA: Diagnosis present

## 2017-08-14 DIAGNOSIS — R079 Chest pain, unspecified: Secondary | ICD-10-CM | POA: Diagnosis not present

## 2017-08-14 DIAGNOSIS — I071 Rheumatic tricuspid insufficiency: Secondary | ICD-10-CM | POA: Diagnosis present

## 2017-08-14 DIAGNOSIS — E282 Polycystic ovarian syndrome: Secondary | ICD-10-CM | POA: Diagnosis present

## 2017-08-14 DIAGNOSIS — Z87440 Personal history of urinary (tract) infections: Secondary | ICD-10-CM | POA: Diagnosis not present

## 2017-08-14 DIAGNOSIS — I272 Pulmonary hypertension, unspecified: Secondary | ICD-10-CM | POA: Diagnosis present

## 2017-08-14 DIAGNOSIS — E781 Pure hyperglyceridemia: Secondary | ICD-10-CM | POA: Diagnosis present

## 2017-08-14 DIAGNOSIS — E875 Hyperkalemia: Secondary | ICD-10-CM | POA: Diagnosis present

## 2017-08-14 DIAGNOSIS — I2699 Other pulmonary embolism without acute cor pulmonale: Principal | ICD-10-CM | POA: Diagnosis present

## 2017-08-14 DIAGNOSIS — Z7982 Long term (current) use of aspirin: Secondary | ICD-10-CM | POA: Diagnosis not present

## 2017-08-14 DIAGNOSIS — N183 Chronic kidney disease, stage 3 unspecified: Secondary | ICD-10-CM | POA: Diagnosis present

## 2017-08-14 DIAGNOSIS — I214 Non-ST elevation (NSTEMI) myocardial infarction: Secondary | ICD-10-CM

## 2017-08-14 DIAGNOSIS — Z794 Long term (current) use of insulin: Secondary | ICD-10-CM

## 2017-08-14 DIAGNOSIS — Z951 Presence of aortocoronary bypass graft: Secondary | ICD-10-CM | POA: Diagnosis not present

## 2017-08-14 DIAGNOSIS — S99911A Unspecified injury of right ankle, initial encounter: Secondary | ICD-10-CM | POA: Diagnosis not present

## 2017-08-14 DIAGNOSIS — E1129 Type 2 diabetes mellitus with other diabetic kidney complication: Secondary | ICD-10-CM | POA: Diagnosis present

## 2017-08-14 DIAGNOSIS — Z9049 Acquired absence of other specified parts of digestive tract: Secondary | ICD-10-CM | POA: Diagnosis not present

## 2017-08-14 DIAGNOSIS — I25708 Atherosclerosis of coronary artery bypass graft(s), unspecified, with other forms of angina pectoris: Secondary | ICD-10-CM | POA: Diagnosis not present

## 2017-08-14 DIAGNOSIS — R55 Syncope and collapse: Secondary | ICD-10-CM | POA: Diagnosis not present

## 2017-08-14 DIAGNOSIS — M2550 Pain in unspecified joint: Secondary | ICD-10-CM | POA: Diagnosis not present

## 2017-08-14 DIAGNOSIS — R7989 Other specified abnormal findings of blood chemistry: Secondary | ICD-10-CM

## 2017-08-14 DIAGNOSIS — R0602 Shortness of breath: Secondary | ICD-10-CM | POA: Diagnosis not present

## 2017-08-14 DIAGNOSIS — Z9842 Cataract extraction status, left eye: Secondary | ICD-10-CM

## 2017-08-14 DIAGNOSIS — S0990XA Unspecified injury of head, initial encounter: Secondary | ICD-10-CM | POA: Diagnosis not present

## 2017-08-14 DIAGNOSIS — I2510 Atherosclerotic heart disease of native coronary artery without angina pectoris: Secondary | ICD-10-CM | POA: Diagnosis present

## 2017-08-14 DIAGNOSIS — Z8701 Personal history of pneumonia (recurrent): Secondary | ICD-10-CM | POA: Diagnosis not present

## 2017-08-14 DIAGNOSIS — Z79899 Other long term (current) drug therapy: Secondary | ICD-10-CM

## 2017-08-14 DIAGNOSIS — I200 Unstable angina: Secondary | ICD-10-CM | POA: Diagnosis not present

## 2017-08-14 DIAGNOSIS — Z9841 Cataract extraction status, right eye: Secondary | ICD-10-CM

## 2017-08-14 DIAGNOSIS — Z961 Presence of intraocular lens: Secondary | ICD-10-CM | POA: Diagnosis present

## 2017-08-14 DIAGNOSIS — M7989 Other specified soft tissue disorders: Secondary | ICD-10-CM | POA: Diagnosis not present

## 2017-08-14 DIAGNOSIS — R002 Palpitations: Secondary | ICD-10-CM | POA: Diagnosis not present

## 2017-08-14 DIAGNOSIS — I129 Hypertensive chronic kidney disease with stage 1 through stage 4 chronic kidney disease, or unspecified chronic kidney disease: Secondary | ICD-10-CM | POA: Diagnosis present

## 2017-08-14 DIAGNOSIS — K219 Gastro-esophageal reflux disease without esophagitis: Secondary | ICD-10-CM | POA: Diagnosis not present

## 2017-08-14 DIAGNOSIS — E119 Type 2 diabetes mellitus without complications: Secondary | ICD-10-CM | POA: Diagnosis not present

## 2017-08-14 LAB — CBC
HCT: 40.7 % (ref 36.0–46.0)
Hemoglobin: 13.3 g/dL (ref 12.0–15.0)
MCH: 30.9 pg (ref 26.0–34.0)
MCHC: 32.7 g/dL (ref 30.0–36.0)
MCV: 94.4 fL (ref 78.0–100.0)
Platelets: 231 10*3/uL (ref 150–400)
RBC: 4.31 MIL/uL (ref 3.87–5.11)
RDW: 13.3 % (ref 11.5–15.5)
WBC: 6.9 10*3/uL (ref 4.0–10.5)

## 2017-08-14 LAB — I-STAT TROPONIN, ED: Troponin i, poc: 0.18 ng/mL (ref 0.00–0.08)

## 2017-08-14 LAB — BASIC METABOLIC PANEL
Anion gap: 7 (ref 5–15)
BUN: 29 mg/dL — ABNORMAL HIGH (ref 6–20)
CO2: 24 mmol/L (ref 22–32)
Calcium: 10.1 mg/dL (ref 8.9–10.3)
Chloride: 109 mmol/L (ref 101–111)
Creatinine, Ser: 1.13 mg/dL — ABNORMAL HIGH (ref 0.44–1.00)
GFR calc Af Amer: 51 mL/min — ABNORMAL LOW (ref >60)
GFR calc non Af Amer: 44 mL/min — ABNORMAL LOW (ref >60)
Glucose, Bld: 125 mg/dL — ABNORMAL HIGH (ref 65–99)
Potassium: 4.3 mmol/L (ref 3.5–5.1)
Sodium: 140 mmol/L (ref 135–145)

## 2017-08-14 MED ORDER — ONDANSETRON HCL 4 MG PO TABS: 4.0000 mg | ORAL_TABLET | Freq: Four times a day (QID) | ORAL | Status: DC | PRN

## 2017-08-14 MED ORDER — ZOLPIDEM TARTRATE 5 MG PO TABS
5.0000 mg | ORAL_TABLET | Freq: Every evening | ORAL | Status: DC | PRN
  Administered 2017-08-15 – 2017-08-16 (×3): 5 mg via ORAL
  Filled 2017-08-14 (×3): qty 1

## 2017-08-14 MED ORDER — HEPARIN BOLUS VIA INFUSION
3800.0000 [IU] | Freq: Once | INTRAVENOUS | Status: AC
  Administered 2017-08-14: 3800 [IU] via INTRAVENOUS
  Filled 2017-08-14: qty 3800

## 2017-08-14 MED ORDER — SODIUM CHLORIDE 0.9 % IV SOLN
INTRAVENOUS | Status: DC
  Administered 2017-08-15 – 2017-08-17 (×5): via INTRAVENOUS

## 2017-08-14 MED ORDER — OXYCODONE-ACETAMINOPHEN 5-325 MG PO TABS: 1.0000 | ORAL_TABLET | ORAL | Status: DC | PRN

## 2017-08-14 MED ORDER — ATORVASTATIN CALCIUM 20 MG PO TABS
20.0000 mg | ORAL_TABLET | Freq: Every day | ORAL | Status: DC
  Administered 2017-08-15 – 2017-08-17 (×3): 20 mg via ORAL
  Filled 2017-08-14 (×3): qty 1

## 2017-08-14 MED ORDER — NITROGLYCERIN 0.4 MG SL SUBL: 0.4000 mg | SUBLINGUAL_TABLET | SUBLINGUAL | Status: DC | PRN

## 2017-08-14 MED ORDER — DIPHENHYDRAMINE-APAP (SLEEP) 25-500 MG PO TABS: 2.0000 | ORAL_TABLET | Freq: Every day | ORAL | Status: DC

## 2017-08-14 MED ORDER — HEPARIN (PORCINE) IN NACL 100-0.45 UNIT/ML-% IJ SOLN
1000.0000 [IU]/h | INTRAMUSCULAR | Status: DC
  Administered 2017-08-14: 750 [IU]/h via INTRAVENOUS
  Administered 2017-08-15 – 2017-08-16 (×2): 1000 [IU]/h via INTRAVENOUS
  Filled 2017-08-14 (×4): qty 250

## 2017-08-14 MED ORDER — METOPROLOL TARTRATE 25 MG PO TABS
25.0000 mg | ORAL_TABLET | Freq: Two times a day (BID) | ORAL | Status: DC
Start: 2017-08-14 — End: 2017-08-17
  Administered 2017-08-15 – 2017-08-17 (×6): 25 mg via ORAL
  Filled 2017-08-14 (×5): qty 1

## 2017-08-14 MED ORDER — ASPIRIN 81 MG PO CHEW
243.0000 mg | CHEWABLE_TABLET | Freq: Once | ORAL | Status: AC
  Administered 2017-08-14: 243 mg via ORAL
  Filled 2017-08-14: qty 3

## 2017-08-14 MED ORDER — ASPIRIN EC 81 MG PO TBEC
81.0000 mg | DELAYED_RELEASE_TABLET | Freq: Every day | ORAL | Status: DC
  Administered 2017-08-15 – 2017-08-17 (×3): 81 mg via ORAL
  Filled 2017-08-14 (×3): qty 1

## 2017-08-14 MED ORDER — FENOFIBRATE 160 MG PO TABS
160.0000 mg | ORAL_TABLET | Freq: Every day | ORAL | Status: DC
  Administered 2017-08-15 – 2017-08-17 (×4): 160 mg via ORAL
  Filled 2017-08-14 (×5): qty 1

## 2017-08-14 MED ORDER — ONDANSETRON HCL 4 MG/2ML IJ SOLN: 4.0000 mg | Freq: Four times a day (QID) | INTRAMUSCULAR | Status: DC | PRN

## 2017-08-14 MED ORDER — MORPHINE SULFATE (PF) 4 MG/ML IV SOLN: 2.0000 mg | INTRAVENOUS | Status: DC | PRN

## 2017-08-14 MED ORDER — IOPAMIDOL (ISOVUE-370) INJECTION 76%
INTRAVENOUS | Status: AC
Start: 2017-08-14 — End: 2017-08-14
  Administered 2017-08-14: 100 mL
  Filled 2017-08-14: qty 100

## 2017-08-14 MED ORDER — HYDRALAZINE HCL 20 MG/ML IJ SOLN: 5.0000 mg | INTRAMUSCULAR | Status: DC | PRN

## 2017-08-14 MED ORDER — INSULIN ASPART 100 UNIT/ML ~~LOC~~ SOLN
0.0000 [IU] | Freq: Three times a day (TID) | SUBCUTANEOUS | Status: DC
  Administered 2017-08-16: 2 [IU] via SUBCUTANEOUS
  Administered 2017-08-16 (×2): 1 [IU] via SUBCUTANEOUS
  Administered 2017-08-17: 2 [IU] via SUBCUTANEOUS

## 2017-08-14 MED ORDER — INSULIN GLARGINE 100 UNIT/ML ~~LOC~~ SOLN
15.0000 [IU] | Freq: Every day | SUBCUTANEOUS | Status: DC
  Administered 2017-08-15 – 2017-08-16 (×3): 15 [IU] via SUBCUTANEOUS
  Filled 2017-08-14 (×4): qty 0.15

## 2017-08-14 MED ORDER — PANTOPRAZOLE SODIUM 40 MG PO TBEC
40.0000 mg | DELAYED_RELEASE_TABLET | Freq: Every day | ORAL | Status: DC
  Administered 2017-08-15 – 2017-08-17 (×4): 40 mg via ORAL
  Filled 2017-08-14 (×3): qty 1

## 2017-08-14 NOTE — ED Notes (Signed)
Patient to CT.

## 2017-08-14 NOTE — ED Provider Notes (Addendum)
[facility] Provider Note   CSN: [id number] Arrival date & time: 08/14/17  1307     History   Chief Complaint Chief Complaint  Patient presents with  . Chest Pain  . Shortness of Breath    HPI Jennifer Warner is a 83 y.o. female.  HPI   43yF with CP, dyspnea and fatigued. She  has a history of hypertension, dyslipidemia, diabetes- on insulin and CAD. Abnormal myoview in 2009 leading to [facility] with critical ostial LAD stenosis and had CABG in [place], [place] with grafts to LAD and RCA. In 04/2016 she had repeat cath because of CP and dyspnea. Patent grafts with high grade obstruction in mid circumflex and 70% stenosis in PDA. Medical therapy at that time.   She has been having exertional symptoms for the past ~2 weeks. Progressively worsening. Today she took her dog out and had to rest because of her symptoms. "The next thing I knew, I think I passed out." She fell injuring her R ankle.  Denies acute pain elsewhere. Currently no symptoms when I evaluated her, but she did feel SOB and had chest pressure just with getting out of wheelchair and transferring onto stretcher. Reports compliance with meds. Had 81mg  asa earlier today. O2 sat 88-92% on RA on arrival to ED. Now normal on 2L via nasal cannula. Does not use supplemental o2 at baseline. Denies has hx of DVT/PE. No unusual leg pain/swelling aside from R ankle after fall.     Past Medical History:  Diagnosis Date  . Acute thyroiditis   . CAD (coronary artery disease)   . CAP (community acquired pneumonia) 12/2012  . Essential hypertension, benign   . GERD (gastroesophageal reflux disease)   . History of blood transfusion 05/2008   "when I had my bypass"  . Hyperkalemia   . Hypertriglyceridemia   . Other secondary thrombocytopenia   . Polycystic ovaries   . Postsurgical aortocoronary bypass status   . Pure hypercholesterolemia   . Type II diabetes mellitus ([place])   . Urinary tract infection,  site not specified     Patient Active Problem List   Diagnosis Date Noted  . Hyperlipidemia   . Essential hypertension   . Angina pectoris ([place])   . Unstable angina ([place]) 05/02/2016  . Chronic cough 04/02/2013  . Cavitating mass in righ lower lobe superior segment, 01/08/2013 02/12/2013  . Community acquired pneumonia 01/07/2013  . Type 2 diabetes mellitus ([place]) 01/07/2013  . CAD (coronary artery disease)   . Hypertriglyceridemia   . Essential hypertension, benign   . S/P CABG x 2     Past Surgical History:  Procedure Laterality Date  . CARDIAC CATHETERIZATION  05/2008  . CARDIAC CATHETERIZATION N/A 05/03/2016   Procedure: Left Heart Cath and Coronary Angiography;  Surgeon: Belva Crome, MD;  Location: [facility];  Service: Cardiovascular;  Laterality: N/A;  . CATARACT EXTRACTION W/ INTRAOCULAR LENS  IMPLANT, BILATERAL Bilateral ~ 2000  . [place]  . CORONARY ARTERY BYPASS GRAFT  05/2008   X2, [place] to LAD, SVG to PDA  . DILATION AND CURETTAGE OF UTERUS    . LAPAROSCOPIC CHOLECYSTECTOMY  1990s  . TUBAL LIGATION      OB History    No data available       Home Medications    Prior to Admission medications   Medication Sig Start Date End Date Taking? Authorizing Provider  aspirin EC 81 MG EC tablet Take 1  tablet (81 mg total) by mouth daily. 05/04/16   Arbutus Leas, NP  atorvastatin (LIPITOR) 20 MG tablet Take 1 tablet by mouth daily at 6 PM.  02/18/15   [provider]  diphenhydramine-acetaminophen (TYLENOL PM) 25-500 MG TABS Take 2 tablets by mouth at bedtime.    [provider]  fenofibrate (TRICOR) 145 MG tablet TAKE 1 TABLET BY MOUTH EVERY EVENING 05/19/15   Martinique, Peter M, MD  LANTUS SOLOSTAR 100 UNIT/ML injection Inject 25 Units into the skin at bedtime.  09/22/12   [provider]  metoprolol tartrate (LOPRESSOR) 25 MG tablet Take 1 tablet (25 mg total) by mouth 2 (two) times daily. 04/17/13   [place], Peter M, MD   nitroGLYCERIN (NITROSTAT) 0.4 MG SL tablet Place 1 tablet (0.4 mg total) under the tongue every 5 (five) minutes x 3 doses as needed for chest pain. 05/04/16   Arbutus Leas, NP  omeprazole (PRILOSEC) 20 MG capsule Take 1 capsule (20 mg total) by mouth every evening. 07/14/17   [place], Peter M, MD  Vitamin D, Ergocalciferol, (DRISDOL) 50000 units CAPS capsule Take 50,000 Units by mouth 2 (two) times a week. 04/04/16   [provider]    Family History Family History  Problem Relation Age of Onset  . Liver disease Mother 49       deceased  . Cancer Brother        x3  . Stroke Brother   . Breast cancer Neg Hx     Social History Social History  Substance Use Topics  . Smoking status: Never Smoker  . Smokeless tobacco: Never Used  . Alcohol use No     Allergies   Levofloxacin   Review of Systems Review of Systems  All systems reviewed and negative, other than as noted in HPI.  Physical Exam Updated Vital Signs BP (!) 142/66 (BP Location: Left Arm)   Pulse 81   Temp (!) 97.5 F (36.4 C) (Oral)   Resp 18   SpO2 91%   Physical Exam  Constitutional: She appears well-developed and well-nourished. No distress.  HENT:  Head: Normocephalic and atraumatic.  Eyes: Conjunctivae are normal. Right eye exhibits no discharge. Left eye exhibits no discharge.  Neck: Neck supple.  Cardiovascular: Normal rate, regular rhythm and normal heart sounds.  Exam reveals no gallop and no friction rub.   No murmur heard. Pulmonary/Chest: Effort normal and breath sounds normal. No respiratory distress.  Abdominal: Soft. She exhibits no distension. There is no tenderness.  Musculoskeletal: She exhibits tenderness. She exhibits no edema.  Mild swelling and TTP over lateral malleolus R ankle. Can actively range. Palpable DP pulse. No proximal tib/fib tenderness.   Neurological: She is alert.  Skin: Skin is warm and dry.  Psychiatric: She has a normal mood and affect. Her behavior is  normal. Thought content normal.  Nursing note and vitals reviewed.    ED Treatments / Results  Labs (all labs ordered are listed, but only abnormal results are displayed) Labs Reviewed  BASIC METABOLIC PANEL - Abnormal; Notable for the following:       Result Value   Glucose, Bld 125 (*)    BUN 29 (*)    Creatinine, Ser 1.13 (*)    GFR calc non Af Amer 44 (*)    GFR calc Af Amer 51 (*)    All other components within normal limits  I-STAT TROPONIN, ED - Abnormal; Notable for the following:    Troponin i, poc 0.18 (*)  All other components within normal limits  CBC    EKG  EKG Interpretation  Date/Time:  Monday August 14 2017 13:17:16 EDT Ventricular Rate:  80 PR Interval:  144 QRS Duration: 86 QT Interval:  386 QTC Calculation: 445 R Axis:   41 Text Interpretation:  Normal sinus rhythm Non-specific ST-t changes Confirmed by Virgel Manifold [id number]) on 08/14/2017 2:59:30 PM       Radiology Dg Chest 2 View  Result Date: 08/14/2017 CLINICAL DATA:  Chest pain.  Heavy weight on chest. EXAM: CHEST  2 VIEW COMPARISON:  05/02/2016. FINDINGS: Normal cardiomediastinal silhouette. Prior CABG. No consolidation or edema. No effusion or pneumothorax. Bones unremarkable. Compared with priors, resolution of mid lung zone nodular density. Carotid atherosclerosis. IMPRESSION: No active cardiopulmonary disease. Electronically Signed   By: Staci Righter M.D.   On: 08/14/2017 14:39   Dg Ankle Complete Right  Result Date: 08/14/2017 CLINICAL DATA:  Right ankle injury due to a fall which occurred during episode of syncope 08/13/2017. Initial encounter. EXAM: RIGHT ANKLE - COMPLETE 3+ VIEW COMPARISON:  None. FINDINGS: There is some soft tissue swelling about the lateral aspect of the ankle but no underlying acute bony or joint abnormality is identified. Atherosclerotic calcifications are noted. IMPRESSION: Lateral soft tissue swelling without underlying acute bony abnormality. Electronically  Signed   By: Inge Rise M.D.   On: 08/14/2017 14:39    Procedures Procedures (including critical care time)  CRITICAL CARE Performed by: Virgel Manifold Total critical care time: 35 minutes Critical care time was exclusive of separately billable procedures and treating other patients. Critical care was necessary to treat or prevent imminent or life-threatening deterioration. Critical care was time spent personally by me on the following activities: development of treatment plan with patient and/or surrogate as well as nursing, discussions with consultants, evaluation of patient's response to treatment, examination of patient, obtaining history from patient or surrogate, ordering and performing treatments and interventions, ordering and review of laboratory studies, ordering and review of radiographic studies, pulse oximetry and re-evaluation of patient's condition.   Medications Ordered in ED Medications - No data to display   Initial Impression / Assessment and Plan / ED Course  I have reviewed the triage vital signs and the nursing notes.  Pertinent labs & imaging results that were available during my care of the patient were reviewed by me and considered in my medical decision making (see chart for details).     83 year old female with typical symptoms of ACS. Exertional chest pain, dyspnea and fatigue.  Improved with rest.  Troponin is mildly elevated.  EKG is changed from prior.  No symptoms currently at rest.  Aspirin.  Heparin.  Cardiology consultation. Clinically not volume overloaded. Sounds clear on exam. CXR w/o acute findings. Decompensated HF, infectious, PE, etc felt less likely.   7:05 PM Evaluated by Dr Burt Knack, cardiology. Questioning possible PE. I think very reasonable to scan. I agree she sounds clear and CXR also clear. Also syncopal event which sometimes see with massive PE but not so much with ACS.  Already getting heparin. Medicine admit if does have PE.    9:26 PM Dr Burt Knack was correct. Will discuss with CCM, re: consideration for lysis although I doubt. Troponin is elevated, did have syncopal event, RH strain based on CT. No hypotension. O2 sats ok on 2L.  10:20 PM Discussed with Dr Janann Colonel, CCM. They will see her in consultation.   Final Clinical Impressions(s) / ED Diagnoses   Final diagnoses:  PE (  pulmonary thromboembolism) [facility])    New Prescriptions New Prescriptions   No medications on file     Virgel Manifold, MD 08/14/17 1537    Virgel Manifold, MD 08/14/17 2220

## 2017-08-14 NOTE — H&P (Addendum)
History and Physical    Jennifer Warner IEP:[id number] DOB: [birth year] DOA: 08/14/2017  Referring MD/NP/PA:   PCP: Foye Spurling, MD   Patient coming from:  The patient is coming from home.  At baseline, pt is independent for most of ADL.   Chief Complaint: Shortness of breath, chest pain, syncope  HPI: Jennifer Warner is a 83 y.o. female with medical history significant of CAD, s/p of CABG, hypertension, hyperlipidemia, diabetes mellitus, GERD, polycystic ovaries, CKD-3, who presents with shortness breath, chest pain or syncope.  Pt states that she has been having shortness of breath and chest pain in the past 2 weeks. Her chest pain is located in the frontal chest, intermittent, mild, aggravated by exertion or movement. Pt has dry cough, no fever or chills. She denies recent traveling. She states that she has intermittent bilateral calf cramping pain recently. She states that she has lightheadedness, and and had syncope episode and fell yesterday. She states that she injured her head and also has pain in right ankle. No neck pain. No leg edema. Patient denies nausea, vomiting, diarrhea, abdominal pain. No symptoms of UTI or unilateral weakness.  ED Course: pt was found to have  troponin 0.18, WBC 6.9, stable renal function, tachycardia, tachypnea, oxygen saturation to 88% on room air. Chest x-ray negative. X-ray of the right ankle is negative for bony fracture. Dr. Burt Knack of Cardiology was consulted--> recommended to get CTA to r/o PE, which was done and showed submassive PE with CT evidence of right heart strain. Patient is admitted to stepdown as inpatient. IV heparin is started. PCCM and will be consulted by EDP.  Review of Systems:   General: no fevers, chills, no body weight gain, has fatigue HEENT: no blurry vision, hearing changes or sore throat Respiratory: has dyspnea, coughing, no wheezing CV: has chest pain, no palpitations GI: no nausea, vomiting, abdominal pain,  diarrhea, constipation GU: no dysuria, burning on urination, increased urinary frequency, hematuria  Ext: no leg edema Neuro: no unilateral weakness, numbness, or tingling, no vision change or hearing loss Skin: no rash, no skin tear. MSK: No muscle spasm, no deformity, no limitation of range of movement in spin Heme: No easy bruising.  Travel history: No recent long distant travel.  Allergy:  Allergies  Allergen Reactions  . Levofloxacin Nausea And Vomiting    Past Medical History:  Diagnosis Date  . Acute thyroiditis   . CAD (coronary artery disease)   . CAP (community acquired pneumonia) 12/2012  . Essential hypertension, benign   . GERD (gastroesophageal reflux disease)   . History of blood transfusion 05/2008   "when I had my bypass"  . Hyperkalemia   . Hypertriglyceridemia   . Other secondary thrombocytopenia   . Polycystic ovaries   . Postsurgical aortocoronary bypass status   . Pure hypercholesterolemia   . Type II diabetes mellitus ([place])   . Urinary tract infection, site not specified     Past Surgical History:  Procedure Laterality Date  . CARDIAC CATHETERIZATION  05/2008  . CARDIAC CATHETERIZATION N/A 05/03/2016   Procedure: Left Heart Cath and Coronary Angiography;  Surgeon: Belva Crome, MD;  Location: [facility];  Service: Cardiovascular;  Laterality: N/A;  . CATARACT EXTRACTION W/ INTRAOCULAR LENS  IMPLANT, BILATERAL Bilateral ~ 2000  . [place]  . CORONARY ARTERY BYPASS GRAFT  05/2008   X2, Melvin to LAD, SVG to PDA  . DILATION AND CURETTAGE OF UTERUS    . LAPAROSCOPIC  CHOLECYSTECTOMY  1990s  . TUBAL LIGATION      Social History:  reports that she has never smoked. She has never used smokeless tobacco. She reports that she does not drink alcohol or use drugs.  Family History:  Family History  Problem Relation Age of Onset  . Liver disease Mother 56       deceased  . Cancer Brother        x3  . Stroke Brother   . Breast cancer  Neg Hx      Prior to Admission medications   Medication Sig Start Date End Date Taking? Authorizing Provider  aspirin EC 81 MG EC tablet Take 1 tablet (81 mg total) by mouth daily. 05/04/16  Yes Arbutus Leas, NP  atorvastatin (LIPITOR) 20 MG tablet Take 20 mg by mouth daily at 6 PM.  02/18/15  Yes [provider]  diphenhydramine-acetaminophen (TYLENOL PM) 25-500 MG TABS Take 2 tablets by mouth at bedtime.   Yes [provider]  fenofibrate (TRICOR) 145 MG tablet TAKE 1 TABLET BY MOUTH EVERY EVENING 05/19/15  Yes [place], Peter M, MD  LANTUS SOLOSTAR 100 UNIT/ML injection Inject 25 Units into the skin at bedtime.  09/22/12  Yes [provider]  metoprolol tartrate (LOPRESSOR) 25 MG tablet Take 1 tablet (25 mg total) by mouth 2 (two) times daily. 04/17/13  Yes [place], Peter M, MD  nitroGLYCERIN (NITROSTAT) 0.4 MG SL tablet Place 1 tablet (0.4 mg total) under the tongue every 5 (five) minutes x 3 doses as needed for chest pain. 05/04/16  Yes Arbutus Leas, NP  omeprazole (PRILOSEC) 20 MG capsule Take 1 capsule (20 mg total) by mouth every evening. 07/14/17  Yes [place], Peter M, MD  Vitamin D, Ergocalciferol, (DRISDOL) 50000 units CAPS capsule Take 50,000 Units by mouth 2 (two) times a week. 04/04/16  Yes [provider]    Physical Exam: Vitals:   08/14/17 1930 08/14/17 2015 08/14/17 2100 08/14/17 2200  BP: (!) 146/78 (!) 118/106 108/67 (!) 98/58  Pulse: 80 91 100 100  Resp: (!) 27 (!) 21 (!) 25 (!) 24  Temp:      TempSrc:      SpO2: 100% 91% 96% 95%  Weight:      Height:       General: Not in acute distress HEENT:       Eyes: PERRL, EOMI, no scleral icterus.       ENT: No discharge from the ears and nose, no pharynx injection, no tonsillar enlargement.        Neck: No JVD, no bruit, no mass felt. Heme: No neck lymph node enlargement. Cardiac: S1/S2, RRR, 1/6 of systolic murmurs, No gallops or rubs. Respiratory:  No rales, wheezing, rhonchi or rubs. GI:  Soft, nondistended, nontender, no rebound pain, no organomegaly, BS present. GU: No hematuria Ext: No pitting leg edema bilaterally. 2+DP/PT pulse bilaterally. Musculoskeletal: No joint deformities, No joint redness or warmth, no limitation of ROM in spin. Has tenderness in right ankle. Skin: No rashes.  Neuro: Alert, oriented X3, cranial nerves II-XII grossly intact, moves all extremities normally. Psych: Patient is not psychotic, no suicidal or hemocidal ideation.  Labs on Admission: I have personally reviewed following labs and imaging studies  CBC:  Recent Labs Lab 08/14/17 1331  WBC 6.9  HGB 13.3  HCT 40.7  MCV 94.4  PLT 902   Basic Metabolic Panel:  Recent Labs Lab 08/14/17 1331  NA 140  K 4.3  CL 109  CO2 24  GLUCOSE 125*  BUN 29*  CREATININE 1.13*  CALCIUM 10.1   GFR: Estimated Creatinine Clearance: 34.9 mL/min (A) (by C-G formula based on SCr of 1.13 mg/dL (H)). Liver Function Tests: No results for input(s): AST, ALT, ALKPHOS, BILITOT, PROT, ALBUMIN in the last 168 hours. No results for input(s): LIPASE, AMYLASE in the last 168 hours. No results for input(s): AMMONIA in the last 168 hours. Coagulation Profile: No results for input(s): INR, PROTIME in the last 168 hours. Cardiac Enzymes: No results for input(s): CKTOTAL, CKMB, CKMBINDEX, TROPONINI in the last 168 hours. BNP (last 3 results) No results for input(s): PROBNP in the last 8760 hours. HbA1C: No results for input(s): HGBA1C in the last 72 hours. CBG: No results for input(s): GLUCAP in the last 168 hours. Lipid Profile: No results for input(s): CHOL, HDL, LDLCALC, TRIG, CHOLHDL, LDLDIRECT in the last 72 hours. Thyroid Function Tests: No results for input(s): TSH, T4TOTAL, FREET4, T3FREE, THYROIDAB in the last 72 hours. Anemia Panel: No results for input(s): VITAMINB12, FOLATE, FERRITIN, TIBC, IRON, RETICCTPCT in the last 72 hours. Urine analysis:    Component Value Date/Time   COLORURINE  ORANGE (A) 01/07/2013 2052   APPEARANCEUR TURBID (A) 01/07/2013 2052   LABSPEC 1.024 01/07/2013 2052   PHURINE 5.0 01/07/2013 2052   GLUCOSEU 100 (A) 01/07/2013 2052   HGBUR TRACE (A) 01/07/2013 2052   BILIRUBINUR MODERATE (A) 01/07/2013 2052   KETONESUR 15 (A) 01/07/2013 2052   PROTEINUR 100 (A) 01/07/2013 2052   UROBILINOGEN 2.0 (H) 01/07/2013 2052   NITRITE NEGATIVE 01/07/2013 2052   LEUKOCYTESUR MODERATE (A) 01/07/2013 2052   Sepsis Labs: @LABRCNTIP (procalcitonin:4,lacticidven:4) )No results found for this or any previous visit (from the past 240 hour(s)).   Radiological Exams on Admission: Dg Chest 2 View  Result Date: 08/14/2017 CLINICAL DATA:  Chest pain.  Heavy weight on chest. EXAM: CHEST  2 VIEW COMPARISON:  05/02/2016. FINDINGS: Normal cardiomediastinal silhouette. Prior CABG. No consolidation or edema. No effusion or pneumothorax. Bones unremarkable. Compared with priors, resolution of mid lung zone nodular density. Carotid atherosclerosis. IMPRESSION: No active cardiopulmonary disease. Electronically Signed   By: Staci Righter M.D.   On: 08/14/2017 14:39   Dg Ankle Complete Right  Result Date: 08/14/2017 CLINICAL DATA:  Right ankle injury due to a fall which occurred during episode of syncope 08/13/2017. Initial encounter. EXAM: RIGHT ANKLE - COMPLETE 3+ VIEW COMPARISON:  None. FINDINGS: There is some soft tissue swelling about the lateral aspect of the ankle but no underlying acute bony or joint abnormality is identified. Atherosclerotic calcifications are noted. IMPRESSION: Lateral soft tissue swelling without underlying acute bony abnormality. Electronically Signed   By: Inge Rise M.D.   On: 08/14/2017 14:39   Ct Angio Chest Pe W And/or Wo Contrast  Result Date: 08/14/2017 CLINICAL DATA:  Dyspnea and fatigue x2 weeks. EXAM: CT ANGIOGRAPHY CHEST WITH CONTRAST TECHNIQUE: Multidetector CT imaging of the chest was performed using the standard protocol during bolus  administration of intravenous contrast. Multiplanar CT image reconstructions and MIPs were obtained to evaluate the vascular anatomy. CONTRAST:  80 cc Isovue 370 IV COMPARISON:  05/02/2016 CT and CXR 08/14/2017 FINDINGS: Cardiovascular: Acute pulmonary emboli noted within the distal right and left main pulmonary arteries extending into both lower lobes and right upper lobe. Evidence of right heart strain with RV/ LV ratio 1.11. Moderate aortic atherosclerosis without aneurysm or dissection. Heart is top-normal in size with coronary arteriosclerosis. Mediastinum/Nodes: No enlarged mediastinal, hilar, or axillary lymph nodes. Thyroid  gland, trachea, and esophagus demonstrate no significant findings. Lungs/Pleura: Faint ground-glass infiltrate in the right upper lobe laterally. Stable calcified granuloma in the right upper lobe. Atelectasis and/or scarring in the superior right lower lobe. No effusion or pneumothorax. Upper Abdomen: No acute abnormality.  Cholecystectomy. Musculoskeletal: Status post CABG.  Thoracic spondylosis. Review of the MIP images confirms the above findings. IMPRESSION: 1. Positive for acute PE with CT evidence of right heart strain (RV/LV Ratio = 1.11) consistent with at least submassive (intermediate risk) PE. The presence of right heart strain has been associated with an increased risk of morbidity and mortality. Please activate Code PE by paging [phone]. Critical Value/emergent results were called by telephone at the time of interpretation on 08/14/2017 at 9:25 pm to Dr. Virgel Manifold , who verbally acknowledged these results. 2. Faint ground-glass infiltrate in the right upper lobe, question focus of pneumonia or possibly a pulmonary infarct. 3. Right upper lobe granuloma. Aortic Atherosclerosis (ICD10-I70.0). Electronically Signed   By: Ashley Royalty M.D.   On: 08/14/2017 21:25     EKG: Independently reviewed.  Sinus rhythm, QTC 445, q in III/aVF, T-wave inversion in lead V2-V5,  poor R-wave progression.     Assessment/Plan Principal Problem:   PE (pulmonary thromboembolism) (HCC) Active Problems:   CAD (coronary artery disease)   S/P CABG x 2   Hyperlipidemia   Essential hypertension   CKD (chronic kidney disease), stage III (HCC)   Syncope   Elevated troponin   Type II diabetes mellitus with renal manifestations (HCC)   PE (pulmonary thromboembolism) ([place]): CTA showed submassive PE with CT evidence of right heart strain.Patient has oxygen desaturation and tachycardia, but currently hemodynamically stable. PCCM will be consulted by  EDP (Dr. Wilson Singer).  -admit to stepdown for close monitoring overnight -heparin drip initiated -2D echocardiogram ordered -LE dopplers ordered to evaluate for DVT -repeat EKG in a.m. -BNP -pain control: When necessary Percocet and morphine  Addendum: IR, dr. Kathlene Cote was consulted per PCCM recommendations.  Elevated troponin and hx of CAD: S/P CABG x 2. Trop was 0.18, initially was thought to be NSTEMI. Dr. Burt Knack of cardiology was consulted, who recommended to get CTA to rule out PE, which was done and showed submassive PE with right heart strain. Her elevated troponin is likely due to demand ischemia secondary to submassive PE.  -cycle CE q6 x3 and repeat EKG in the am  - prn Nitroglycerin, Morphine - Lipitor and Tricor - pt is on IV heparin - Risk factor stratification: will check FLP and A1C  - 2d echo  Syncope and fall: most likely due to PE. No focal neurological findings on physical examination. Less likely to have stroke. -CT-head without contrast -pt/ot  Type II diabetes mellitus with renal manifestations ([place]): Last A1c 6.6 on 05/02/16, well controled. Patient is taking Lantus at home -will decrease Lantus dose from 25-->15 units   -SSI  HLD: -lipitor and Tricor  Essential hypertension: -Continue metoprolol -IV hydralazine when necessary  GERD: -Protonix  GERD: -Protonix   CKD (chronic kidney  disease), stage III ([place]): stable. Baseline creatinine 1.1-1.2. Her creatinine is 1.13, BUN 29. -Follow-up renal function by BMP   DVT ppx: on IV Heparin   Code Status: Full code Family Communication: None at bed side.    Disposition Plan:  Anticipate discharge back to previous home environment Consults called:  Card, Dr. Burt Knack and PCCM Admission status:  SDU/inpation       Date of Service 08/14/2017    Ivor Costa [facility] Pager [phone]  If 7PM-7AM, please contact night-coverage [email or web address] Password [email or web address] 08/14/2017, 10:35 PM

## 2017-08-14 NOTE — Consult Note (Signed)
Name: Jennifer Warner. [name] MRN: [id number] DOB: 10/29/1933    ADMISSION DATE:  08/14/2017 CONSULTATION DATE:  08/14/2017  REFERRING MD :  Dr. Blaine Hamper  CHIEF COMPLAINT:  SOB/ sycope  HISTORY OF PRESENT ILLNESS:   83 year old female with PMH of never smoker, DMT2, CAD s/p prior CABG, HTN, HLD, CKD stage 3, and GERD admitted 10/29 by [facility] to SDU with submassive PE.  Reports onset of exertional dyspnea with chest heaviness 2 weeks ago with near syncopal episodes.  Only able to walk a few feet.  Yesterday was taking her dogs out and reports unwitnessed syncopal episode on her porch.  Denies hitting her head but experienced right ankle pain after fall. Additionally, she reports episodes of heart palpitations/racing and generalized weakness x 2 weeks, nonproductive cough, and chills last night.  Denies fever, headache, new recent medications, significant weight loss, leg swelling, or recent traveling.  Takes ASA only.  Denies prior DVT/ PE.   Labs noted for troponin 0.18, WBC 6.9.  Patient has been afebrile, hemodynamically stable and requiring 2L [place] for room sats between 88-92%.  EKG SR with ST/[place] abnormalities, possible anterior ischemia.  Cardiology consulted.  CTA chest showing acute submassive PE with RV/LV strain 1.11.  She was placed on IV heparin gtt and PCCM consulted for further pulmonary recommendations.   PAST MEDICAL HISTORY :   has a past medical history of Acute thyroiditis; CAD (coronary artery disease); CAP (community acquired pneumonia) (12/2012); Essential hypertension, benign; GERD (gastroesophageal reflux disease); History of blood transfusion (05/2008); Hyperkalemia; Hypertriglyceridemia; Other secondary thrombocytopenia; Polycystic ovaries; Postsurgical aortocoronary bypass status; Pure hypercholesterolemia; Type II diabetes mellitus ([place]); and Urinary tract infection, site not specified.  has a past surgical history that includes Cardiac catheterization (05/2008); Laparoscopic  cholecystectomy (1990s); Dilation and curettage of uterus; Cesarean section (1964); Tubal ligation; Cataract extraction w/ intraocular lens  implant, bilateral (Bilateral, ~ 2000); Coronary artery bypass graft (05/2008); and Cardiac catheterization (N/A, 05/03/2016). Prior to Admission medications   Medication Sig Start Date End Date Taking? Authorizing Provider  aspirin EC 81 MG EC tablet Take 1 tablet (81 mg total) by mouth daily. 05/04/16  Yes Arbutus Leas, NP  atorvastatin (LIPITOR) 20 MG tablet Take 20 mg by mouth daily at 6 PM.  02/18/15  Yes [provider]  diphenhydramine-acetaminophen (TYLENOL PM) 25-500 MG TABS Take 2 tablets by mouth at bedtime.   Yes [provider]  fenofibrate (TRICOR) 145 MG tablet TAKE 1 TABLET BY MOUTH EVERY EVENING 05/19/15  Yes [place], Peter M, MD  LANTUS SOLOSTAR 100 UNIT/ML injection Inject 25 Units into the skin at bedtime.  09/22/12  Yes [provider]  metoprolol tartrate (LOPRESSOR) 25 MG tablet Take 1 tablet (25 mg total) by mouth 2 (two) times daily. 04/17/13  Yes [place], Peter M, MD  nitroGLYCERIN (NITROSTAT) 0.4 MG SL tablet Place 1 tablet (0.4 mg total) under the tongue every 5 (five) minutes x 3 doses as needed for chest pain. 05/04/16  Yes Arbutus Leas, NP  omeprazole (PRILOSEC) 20 MG capsule Take 1 capsule (20 mg total) by mouth every evening. 07/14/17  Yes [place], Peter M, MD  Vitamin D, Ergocalciferol, (DRISDOL) 50000 units CAPS capsule Take 50,000 Units by mouth 2 (two) times a week. 04/04/16  Yes [provider]   Allergies  Allergen Reactions  . Levofloxacin Nausea And Vomiting    FAMILY HISTORY:  family history includes Cancer in her brother; Liver disease (age of onset: 47) in her mother; Stroke  in her brother. SOCIAL HISTORY:  reports that she has never smoked. She has never used smokeless tobacco. She reports that she does not drink alcohol or use drugs.  REVIEW OF SYSTEMS:   Constitutional: Negative for  fever, chills, weight loss, malaise/fatigue and diaphoresis.  HENT: Negative for hearing loss, ear pain, nosebleeds, congestion, sore throat, neck pain, tinnitus and ear discharge.   Eyes: Negative for blurred vision, double vision, photophobia, pain, discharge and redness.  Respiratory: Negative for dry cough, hemoptysis, sputum production, shortness of breath, wheezing and stridor.   Cardiovascular: Negative for chest pain, palpitations, orthopnea, claudication, leg swelling and PND.  syncope Gastrointestinal: Negative for heartburn, nausea, vomiting, abdominal pain, diarrhea, constipation, blood in stool and melena.  Genitourinary: Negative for dysuria, urgency, frequency, hematuria and flank pain.  Musculoskeletal: Negative for myalgias, back pain, joint pain and falls.  Skin: Negative for itching and rash.  Neurological: Negative for dizziness, tingling, tremors, sensory change, speech change, focal weakness, seizures, loss of consciousness, weakness and headaches.  Endo/Heme/Allergies: Negative for environmental allergies and polydipsia. Does not bruise/bleed easily.  SUBJECTIVE:  Denies present CP or SOB 2L at 99%  VITAL SIGNS: Temp:  [97.5 F (36.4 C)] 97.5 F (36.4 C) (10/29 1322) Pulse Rate:  [79-100] 100 (10/29 2200) Resp:  [17-27] 24 (10/29 2200) BP: (98-178)/(58-106) 98/58 (10/29 2200) SpO2:  [91 %-100 %] 95 % (10/29 2200) Weight:  [139 lb (63 kg)] 139 lb (63 kg) (10/29 1532)  PHYSICAL EXAMINATION: General:  Pleasant elderly female sitting up in ED stretcher in NAD HEENT: MM pink/moist Neuro:  AOx3, MAE, non-focal CV: SR, 87, rrr, no m/r/g PULM: even/non-labored, lungs bilaterally clear and diminished  GI: soft, non-tender, bsx4 active  Extremities: warm/dry, no edema or erythema  Skin: no rashes    Recent Labs Lab 08/14/17 1331  NA 140  K 4.3  CL 109  CO2 24  BUN 29*  CREATININE 1.13*  GLUCOSE 125*    Recent Labs Lab 08/14/17 1331  HGB 13.3  HCT 40.7   WBC 6.9  PLT 231   Dg Chest 2 View  Result Date: 08/14/2017 CLINICAL DATA:  Chest pain.  Heavy weight on chest. EXAM: CHEST  2 VIEW COMPARISON:  05/02/2016. FINDINGS: Normal cardiomediastinal silhouette. Prior CABG. No consolidation or edema. No effusion or pneumothorax. Bones unremarkable. Compared with priors, resolution of mid lung zone nodular density. Carotid atherosclerosis. IMPRESSION: No active cardiopulmonary disease. Electronically Signed   By: Staci Righter M.D.   On: 08/14/2017 14:39   Dg Ankle Complete Right  Result Date: 08/14/2017 CLINICAL DATA:  Right ankle injury due to a fall which occurred during episode of syncope 08/13/2017. Initial encounter. EXAM: RIGHT ANKLE - COMPLETE 3+ VIEW COMPARISON:  None. FINDINGS: There is some soft tissue swelling about the lateral aspect of the ankle but no underlying acute bony or joint abnormality is identified. Atherosclerotic calcifications are noted. IMPRESSION: Lateral soft tissue swelling without underlying acute bony abnormality. Electronically Signed   By: Inge Rise M.D.   On: 08/14/2017 14:39   Ct Angio Chest Pe W And/or Wo Contrast  Result Date: 08/14/2017 CLINICAL DATA:  Dyspnea and fatigue x2 weeks. EXAM: CT ANGIOGRAPHY CHEST WITH CONTRAST TECHNIQUE: Multidetector CT imaging of the chest was performed using the standard protocol during bolus administration of intravenous contrast. Multiplanar CT image reconstructions and MIPs were obtained to evaluate the vascular anatomy. CONTRAST:  80 cc Isovue 370 IV COMPARISON:  05/02/2016 CT and CXR 08/14/2017 FINDINGS: Cardiovascular: Acute pulmonary emboli noted within  the distal right and left main pulmonary arteries extending into both lower lobes and right upper lobe. Evidence of right heart strain with RV/ LV ratio 1.11. Moderate aortic atherosclerosis without aneurysm or dissection. Heart is top-normal in size with coronary arteriosclerosis. Mediastinum/Nodes: No enlarged  mediastinal, hilar, or axillary lymph nodes. Thyroid gland, trachea, and esophagus demonstrate no significant findings. Lungs/Pleura: Faint ground-glass infiltrate in the right upper lobe laterally. Stable calcified granuloma in the right upper lobe. Atelectasis and/or scarring in the superior right lower lobe. No effusion or pneumothorax. Upper Abdomen: No acute abnormality.  Cholecystectomy. Musculoskeletal: Status post CABG.  Thoracic spondylosis. Review of the MIP images confirms the above findings. IMPRESSION: 1. Positive for acute PE with CT evidence of right heart strain (RV/LV Ratio = 1.11) consistent with at least submassive (intermediate risk) PE. The presence of right heart strain has been associated with an increased risk of morbidity and mortality. Please activate Code PE by paging [phone]. Critical Value/emergent results were called by telephone at the time of interpretation on 08/14/2017 at 9:25 pm to Dr. Virgel Manifold , who verbally acknowledged these results. 2. Faint ground-glass infiltrate in the right upper lobe, question focus of pneumonia or possibly a pulmonary infarct. 3. Right upper lobe granuloma. Aortic Atherosclerosis (ICD10-I70.0). Electronically Signed   By: Ashley Royalty M.D.   On: 08/14/2017 21:25   SIGNIFICANT EVENTS  10/29 > Admit  STUDIES:  CTA Chest 10/29 >> 1. Positive for acute PE with CT evidence of right heart strain (RV/LV Ratio = 1.11) consistent with at least submassive (intermediate risk) PE. The presence of right heart strain has been associated with an increased risk of morbidity and mortality. Please activate Code PE by paging [phone]. Critical Value/emergent results were called by telephone at the time of interpretation on 08/14/2017 at 9:25 pm to Dr. Virgel Manifold , who verbally acknowledged these results. 2. Faint ground-glass infiltrate in the right upper lobe, question focus of pneumonia or possibly a pulmonary infarct. 3. Right upper lobe  granuloma. 4.  Aortic Atherosclerosis   ASSESSMENT / PLAN:  Acute PE- submassive, with RV/LV ratio 1.11, with syncope, DOE, and chest pain. - large clot burden on CTA, w/ emboli in distal right and left PA extending into both lower lobes and RUL.  Possible pulmonary infarct in RUL. - Hemodynamically stable with minimal O2 requirements - appears unprovoked, last mammogram 05/2017 neg, unknown colon screening   P:  Tele monitoring/ SDU Await TTE BNP pending/ trending troponins Await LE dopplers to evaluate for DVT Continue supplemental O2 for sats > 94% Continue heparin gtt per pharmacy  IR consulted for possible EKOS therpay given large clot burden.    Remainder per Primary and Cardiology.   Kennieth Rad, AGACNP-BC [facility] Pgr: [phone] or if no answer [phone] 08/14/2017, 11:17 PM

## 2017-08-14 NOTE — ED Notes (Signed)
Cardiology at bedside.

## 2017-08-14 NOTE — ED Triage Notes (Signed)
Pt to ER sent by PCP for recurrent chest pressure onset 2 weeks ago with syncopal episode yesterday. States was unwitnessed, hit head, denies pain to head neck, reports pain to left ankle. Pt reports pain is worsened with exertion, associated with shortness of breath. Double bypass in 2009. Pt O2 sats 88%-92%, no respiratory distress noted but nasal cannula applied.

## 2017-08-14 NOTE — H&P (Signed)
Cardiology Admission History and Physical:   Patient ID: Jennifer Warner. [name]; MRN: [id number]; DOB: 1934-01-26   Admission date: 08/14/2017  Primary Care Provider: Foye Spurling, MD Primary Cardiologist: Dr Martinique  Chief Complaint:  Chest pain/shortness of breath  Patient Profile:   Jennifer Warner. [name] is a 83 y.o. female with a history of coronary artery disease with history of CABG in 2009 (LIMA to LAD and SVG to RCA).   History of Present Illness:   [name] is an 83 year old woman with known CAD, last heart catheterization in 2017 demonstrating patency of her bypass grafts but high-grade obstruction in the mid circumflex.  She has been managed medically.  She was in her normal state of health without much physical limitation until approximately 2 weeks ago when she developed exertional chest discomfort and shortness of breath.  Her symptoms occur with low level exertion such as walking a short distance on level ground.  Symptoms resolve after approximately 10-15 minutes of rest.  Yesterday she walked a short distance and began to have discomfort in her chest.  She then developed lightheadedness and shortly thereafter woke up on the ground.  She does not recall having syncope, nor does she recall specifics of the event.  She did experience right ankle pain after her fall.  The patient continues to have mild shortness of breath even at rest.  She is not having any chest discomfort at the time of my interview.  There is no family present at the bedside.  She is typically a very active person.  She denies any history of syncope until this precipitating event.  She denies orthopnea, PND, or leg swelling.   Past Medical History:  Diagnosis Date  . Acute thyroiditis   . CAD (coronary artery disease)   . CAP (community acquired pneumonia) 12/2012  . Essential hypertension, benign   . GERD (gastroesophageal reflux disease)   . History of blood transfusion 05/2008   "when I had my bypass"   . Hyperkalemia   . Hypertriglyceridemia   . Other secondary thrombocytopenia   . Polycystic ovaries   . Postsurgical aortocoronary bypass status   . Pure hypercholesterolemia   . Type II diabetes mellitus ([place])   . Urinary tract infection, site not specified     Past Surgical History:  Procedure Laterality Date  . CARDIAC CATHETERIZATION  05/2008  . CARDIAC CATHETERIZATION N/A 05/03/2016   Procedure: Left Heart Cath and Coronary Angiography;  Surgeon: Belva Crome, MD;  Location: [facility];  Service: Cardiovascular;  Laterality: N/A;  . CATARACT EXTRACTION W/ INTRAOCULAR LENS  IMPLANT, BILATERAL Bilateral ~ 2000  . [place]  . CORONARY ARTERY BYPASS GRAFT  05/2008   X2, [place] to LAD, SVG to PDA  . DILATION AND CURETTAGE OF UTERUS    . LAPAROSCOPIC CHOLECYSTECTOMY  1990s  . TUBAL LIGATION       Medications Prior to Admission: Prior to Admission medications   Medication Sig Start Date End Date Taking? Authorizing Provider  aspirin EC 81 MG EC tablet Take 1 tablet (81 mg total) by mouth daily. 05/04/16  Yes Arbutus Leas, NP  atorvastatin (LIPITOR) 20 MG tablet Take 20 mg by mouth daily at 6 PM.  02/18/15  Yes [provider]  diphenhydramine-acetaminophen (TYLENOL PM) 25-500 MG TABS Take 2 tablets by mouth at bedtime.   Yes [provider]  fenofibrate (TRICOR) 145 MG tablet TAKE 1 TABLET BY MOUTH EVERY EVENING 05/19/15  Yes Martinique,  Ander Slade, MD  LANTUS SOLOSTAR 100 UNIT/ML injection Inject 25 Units into the skin at bedtime.  09/22/12  Yes [provider]  metoprolol tartrate (LOPRESSOR) 25 MG tablet Take 1 tablet (25 mg total) by mouth 2 (two) times daily. 04/17/13  Yes [place], Peter M, MD  nitroGLYCERIN (NITROSTAT) 0.4 MG SL tablet Place 1 tablet (0.4 mg total) under the tongue every 5 (five) minutes x 3 doses as needed for chest pain. 05/04/16  Yes Arbutus Leas, NP  omeprazole (PRILOSEC) 20 MG capsule Take 1 capsule (20 mg total) by mouth  every evening. 07/14/17  Yes [place], Peter M, MD  Vitamin D, Ergocalciferol, (DRISDOL) 50000 units CAPS capsule Take 50,000 Units by mouth 2 (two) times a week. 04/04/16  Yes [provider]     Allergies:    Allergies  Allergen Reactions  . Levofloxacin Nausea And Vomiting    Social History:   Social History   Social History  . Marital status: Married    Spouse name: N/A  . Number of children: 2  . Years of education: N/A   Occupational History  . retired- Air traffic controller     2010   Social History Main Topics  . Smoking status: Never Smoker  . Smokeless tobacco: Never Used  . Alcohol use No  . Drug use: No  . Sexual activity: Not Currently   Other Topics Concern  . Not on file   Social History Narrative  . No narrative on file    Family History:   The patient's family history includes Cancer in her brother; Liver disease (age of onset: 13) in her mother; Stroke in her brother. There is no history of Breast cancer.    ROS:  Please see the history of present illness.  All other ROS reviewed and negative.     Physical Exam/Data:   Vitals:   08/14/17 1532 08/14/17 1615 08/14/17 1700 08/14/17 1830  BP:  (!) 157/92 (!) 163/74 (!) 147/79  Pulse:  80 80 87  Resp:  (!) 25 20 20   Temp:      TempSrc:      SpO2:  100% 99% 97%  Weight: 139 lb (63 kg)     Height: 5\' 3"  (1.6 m)      No intake or output data in the 24 hours ending 08/14/17 1902 Filed Weights   08/14/17 1532  Weight: 139 lb (63 kg)   Body mass index is 24.62 kg/m.  General:  Well nourished, well developed, pleasant elderly woman in no acute distress,  HEENT: normal Lymph: no adenopathy Neck: no JVD Endocrine:  No thryomegaly Vascular: No carotid bruits; FA pulses 2+ bilaterally  Cardiac:  normal S1, S2; RRR; 2/6 SEM at the RUSB Lungs:  clear to auscultation bilaterally, no wheezing, rhonchi or rales  Abd: soft, nontender, no hepatomegaly  Ext: no edema Musculoskeletal:  No  deformities, BUE and BLE strength normal and equal Skin: warm and dry  Neuro:  CNs 2-12 intact, no focal abnormalities noted Psych:  Normal affect    EKG:  The ECG that was done today was personally reviewed and demonstrates normal sinus rhythm with ST/T wave abnormality consider anterior ischemia  Relevant CV Studies: Cardiac Catheterization May 19, 2016: Conclusion   1. Prox RCA to Mid RCA lesion, 70% stenosed. 2. Mid Cx lesion, 80% stenosed. 3. Ost LPDA to LPDA lesion, 70% stenosed. 4. SVG was injected . 5. was injected is normal in caliber, and is anatomically normal. 6. Prox LAD  to Mid LAD lesion, 100% stenosed. 7. Ost LM to LM lesion, 70% stenosed.    Prolonged procedure, caused by failed attempt at left radial approach due to radial loop and the development of a microperforation due to diagnostic catheter/guidewire injury. Hemostasis was achieved with compression and an Ace bandage.  Saphenous vein graft to the third obtuse marginal is widely patent. The procedure was prolonged because the graft was not marked and required aortography to identify the site of origin.  Widely patent LIMA to LAD.  Nondominant moderately diseased, 70% mid right coronary artery.  Total occlusion of the proximal native LAD.  High-grade obstruction of the mid circumflex between the large first and third obtuse marginal branch. There is also 70% stenosis in the PDA.  Normal left ventricular function and hemodynamics.  Atrial fibrillation developed transiently, lasting approximally 3 minutes. Atrial fibrillation was incited by mechanical irritation during left ventriculography.   RECOMMENDATIONS:   Watch medial left arm for evidence of bleeding/hematoma.  Monitor femoral site for evidence of bleeding.  Heparin can probably be discontinued and converted to DVT prophylaxis.     Laboratory Data:  Chemistry Recent Labs Lab 08/14/17 1331  NA 140  K 4.3  CL 109  CO2 24  GLUCOSE 125*   BUN 29*  CREATININE 1.13*  CALCIUM 10.1  GFRNONAA 44*  GFRAA 51*  ANIONGAP 7    No results for input(s): PROT, ALBUMIN, AST, ALT, ALKPHOS, BILITOT in the last 168 hours. Hematology Recent Labs Lab 08/14/17 1331  WBC 6.9  RBC 4.31  HGB 13.3  HCT 40.7  MCV 94.4  MCH 30.9  MCHC 32.7  RDW 13.3  PLT 231   Cardiac EnzymesNo results for input(s): TROPONINI in the last 168 hours.  Recent Labs Lab 08/14/17 1408  TROPIPOC 0.18*    BNPNo results for input(s): BNP, PROBNP in the last 168 hours.  DDimer No results for input(s): DDIMER in the last 168 hours.  Radiology/Studies:  Dg Chest 2 View  Result Date: 08/14/2017 CLINICAL DATA:  Chest pain.  Heavy weight on chest. EXAM: CHEST  2 VIEW COMPARISON:  05/02/2016. FINDINGS: Normal cardiomediastinal silhouette. Prior CABG. No consolidation or edema. No effusion or pneumothorax. Bones unremarkable. Compared with priors, resolution of mid lung zone nodular density. Carotid atherosclerosis. IMPRESSION: No active cardiopulmonary disease. Electronically Signed   By: Staci Righter M.D.   On: 08/14/2017 14:39   Dg Ankle Complete Right  Result Date: 08/14/2017 CLINICAL DATA:  Right ankle injury due to a fall which occurred during episode of syncope 08/13/2017. Initial encounter. EXAM: RIGHT ANKLE - COMPLETE 3+ VIEW COMPARISON:  None. FINDINGS: There is some soft tissue swelling about the lateral aspect of the ankle but no underlying acute bony or joint abnormality is identified. Atherosclerotic calcifications are noted. IMPRESSION: Lateral soft tissue swelling without underlying acute bony abnormality. Electronically Signed   By: Inge Rise M.D.   On: 08/14/2017 14:39    Assessment and Plan:   1.  Non-STEMI 2.  Syncope 3.  Type 2 diabetes 4.  Chronic kidney disease stage III, now off of an ARB because of hyperkalemia 5.  Hypoxemia corrected with supplemental oxygen per nasal cannula  83 year old woman with known CAD and fairly  typical history for acute coronary syndrome.  She is now on IV heparin and she is chest pain-free in the emergency department at the time of my evaluation.  I am concerned about her presentation with chest pain, exertional dyspnea, and syncope with elevated troponin.  In  addition to ACS, this can commonly be seen with acute pulmonary embolus.  Will check a CT-PE study.  If her PE study is negative, we will continue to treat her for acute coronary syndrome with tentative plans for cardiac catheterization tomorrow pending reassessment of her renal function.  She is appropriately on IV heparin which will treat either condition.  I discussed my recommendation with Dr. Wilson Singer and will plan to admit her as long as her PE study is negative.  We discussed cardiac catheterization and possible PCI in detail in the case that her CT scan is negative.  She does have a left radial loop and has undergone bypass surgery with a LIMA graft.  If she does have cardiac catheterization tomorrow, this should be done from a femoral approach.  I have reviewed the risks, indications, and alternatives to cardiac catheterization, possible angioplasty, and stenting with the patient. Risks include but are not limited to bleeding, infection, vascular injury, stroke, myocardial infection, arrhythmia, kidney injury, radiation-related injury in the case of prolonged fluoroscopy use, emergency cardiac surgery, and death. The patient understands the risks of serious complication is 1-2 in 4270 with diagnostic cardiac cath and 1-2% or less with angioplasty/stenting.   Severity of Illness: The appropriate patient status for this patient is INPATIENT. Inpatient status is judged to be reasonable and necessary in order to provide the required intensity of service to ensure the patient's safety. The patient's presenting symptoms, physical exam findings, and initial radiographic and laboratory data in the context of their chronic comorbidities is felt  to place them at high risk for further clinical deterioration. Furthermore, it is not anticipated that the patient will be medically stable for discharge from the hospital within 2 midnights of admission. The following factors support the patient status of inpatient.   " The patient's presenting symptoms include chest pain, shortness of breath. " The worrisome physical exam findings include hypoxemia with normal CXR. " The initial radiographic and laboratory data are worrisome because of elevated troponin. " The chronic co-morbidities include CAD s/p CABG, diabetes.   * I certify that at the point of admission it is my clinical judgment that the patient will require inpatient hospital care spanning beyond 2 midnights from the point of admission due to high intensity of service, high risk for further deterioration and high frequency of surveillance required.*    For questions or updates, please contact [place] Please consult [email or web address] for contact info under Cardiology/STEMI.    Deatra James, MD  08/14/2017 7:02 PM

## 2017-08-14 NOTE — ED Notes (Signed)
Dr Nu at bedside. 

## 2017-08-14 NOTE — Progress Notes (Signed)
ANTICOAGULATION CONSULT NOTE - Initial Consult  Pharmacy Consult for heparin Indication: chest pain/ACS  Allergies  Allergen Reactions  . Levofloxacin     nausea vommiting    Patient Measurements: Height: 5\' 3"  (160 cm) Weight: 139 lb (63 kg) IBW/kg (Calculated) : 52.4 Heparin Dosing Weight: 63 kg  Vital Signs: Temp: 97.5 F (36.4 C) (10/29 1322) Temp Source: Oral (10/29 1322) BP: 178/83 (10/29 1531) Pulse Rate: 80 (10/29 1532)  Labs:  Recent Labs  08/14/17 1331  HGB 13.3  HCT 40.7  PLT 231  CREATININE 1.13*    Estimated Creatinine Clearance: 34.9 mL/min (A) (by C-G formula based on SCr of 1.13 mg/dL (H)).   Medical History: Past Medical History:  Diagnosis Date  . Acute thyroiditis   . CAD (coronary artery disease)   . CAP (community acquired pneumonia) 12/2012  . Essential hypertension, benign   . GERD (gastroesophageal reflux disease)   . History of blood transfusion 05/2008   "when I had my bypass"  . Hyperkalemia   . Hypertriglyceridemia   . Other secondary thrombocytopenia   . Polycystic ovaries   . Postsurgical aortocoronary bypass status   . Pure hypercholesterolemia   . Type II diabetes mellitus ([place])   . Urinary tract infection, site not specified     Medications:  Scheduled:   Assessment: 3 YOF admitted with chest pain, dyspnea and fatigue.  HGB 13.3, Hct 40.7, Plt 231. Troponin 0.18 No anticoag PTA. No bleeding reported.  PMH CAD, HTN, HLD (Last cath 2017). ECG Anterior infarct.   Goal of Therapy:  Heparin level 0.3-0.7 units/ml Monitor platelets by anticoagulation protocol: Yes   Plan:  Give 3800 units bolus x 1 Start heparin infusion at 750 units/hr  8 hour heparin level, daily heparin level and CBC Monitor s/sx of bleeding.   Jerrye Noble PharmD Candidate 08/14/2017,3:46 PM

## 2017-08-15 ENCOUNTER — Other Ambulatory Visit: Payer: Self-pay

## 2017-08-15 ENCOUNTER — Inpatient Hospital Stay (HOSPITAL_COMMUNITY): Payer: Medicare Other

## 2017-08-15 ENCOUNTER — Inpatient Hospital Stay (HOSPITAL_COMMUNITY)
Admit: 2017-08-15 | Discharge: 2017-08-15 | Disposition: A | Discharge status: Home / Self Care | Payer: Medicare Other | Attending: Internal Medicine | Admitting: Internal Medicine

## 2017-08-15 DIAGNOSIS — I2699 Other pulmonary embolism without acute cor pulmonale: Secondary | ICD-10-CM

## 2017-08-15 DIAGNOSIS — R079 Chest pain, unspecified: Secondary | ICD-10-CM

## 2017-08-15 LAB — CBC
HCT: 36.8 % (ref 36.0–46.0)
HEMOGLOBIN: 12 g/dL (ref 12.0–15.0)
MCH: 30.8 pg (ref 26.0–34.0)
MCHC: 32.6 g/dL (ref 30.0–36.0)
MCV: 94.4 fL (ref 78.0–100.0)
PLATELETS: 185 10*3/uL (ref 150–400)
RBC: 3.9 MIL/uL (ref 3.87–5.11)
RDW: 13.1 % (ref 11.5–15.5)
WBC: 3.9 10*3/uL — ABNORMAL LOW (ref 4.0–10.5)

## 2017-08-15 LAB — BASIC METABOLIC PANEL
ANION GAP: 8 (ref 5–15)
BUN: 23 mg/dL — ABNORMAL HIGH (ref 6–20)
CALCIUM: 9.4 mg/dL (ref 8.9–10.3)
CO2: 23 mmol/L (ref 22–32)
CREATININE: 1.05 mg/dL — AB (ref 0.44–1.00)
Chloride: 108 mmol/L (ref 101–111)
GFR, EST AFRICAN AMERICAN: 56 mL/min — AB (ref >60)
GFR, EST NON AFRICAN AMERICAN: 48 mL/min — AB (ref >60)
GLUCOSE: 82 mg/dL (ref 65–99)
Potassium: 3.8 mmol/L (ref 3.5–5.1)
Sodium: 139 mmol/L (ref 135–145)

## 2017-08-15 LAB — LIPID PANEL
CHOL/HDL RATIO: 5.1 ratio
CHOLESTEROL: 142 mg/dL (ref 0–200)
HDL: 28 mg/dL — AB (ref >40)
LDL CALC: 58 mg/dL (ref 0–99)
TRIGLYCERIDES: 281 mg/dL — AB (ref <150)
VLDL: 56 mg/dL — AB (ref 0–40)

## 2017-08-15 LAB — ECHOCARDIOGRAM COMPLETE
Height: 63 in
WEIGHTICAEL: 2224 [oz_av]

## 2017-08-15 LAB — HEPARIN LEVEL (UNFRACTIONATED)
HEPARIN UNFRACTIONATED: 0.12 [IU]/mL — AB (ref 0.30–0.70)
HEPARIN UNFRACTIONATED: 0.53 [IU]/mL (ref 0.30–0.70)
Heparin Unfractionated: 0.49 IU/mL (ref 0.30–0.70)

## 2017-08-15 LAB — GLUCOSE, CAPILLARY
GLUCOSE-CAPILLARY: 82 mg/dL (ref 65–99)
Glucose-Capillary: 74 mg/dL (ref 65–99)
Glucose-Capillary: 122 mg/dL — ABNORMAL HIGH (ref 65–99)

## 2017-08-15 LAB — HEMOGLOBIN A1C
Hgb A1c MFr Bld: 6.1 % — ABNORMAL HIGH (ref 4.8–5.6)
Mean Plasma Glucose: 128.37 mg/dL

## 2017-08-15 LAB — APTT: APTT: 55 s — AB (ref 24–36)

## 2017-08-15 LAB — MRSA PCR SCREENING: MRSA by PCR: NEGATIVE

## 2017-08-15 LAB — TROPONIN I
Troponin I: 0.16 ng/mL (ref <0.03)
Troponin I: 0.13 ng/mL (ref <0.03)

## 2017-08-15 LAB — BRAIN NATRIURETIC PEPTIDE: B NATRIURETIC PEPTIDE 5: 320.5 pg/mL — AB (ref 0.0–100.0)

## 2017-08-15 LAB — CBG MONITORING, ED
GLUCOSE-CAPILLARY: 123 mg/dL — AB (ref 65–99)
GLUCOSE-CAPILLARY: 137 mg/dL — AB (ref 65–99)

## 2017-08-15 LAB — PROTIME-INR
INR: 1.14
Prothrombin Time: 14.5 seconds (ref 11.4–15.2)

## 2017-08-15 MED ORDER — ORAL CARE MOUTH RINSE
15.0000 mL | Freq: Two times a day (BID) | OROMUCOSAL | Status: DC
  Administered 2017-08-15 – 2017-08-16 (×3): 15 mL via OROMUCOSAL

## 2017-08-15 MED ORDER — HEPARIN BOLUS VIA INFUSION
2000.0000 [IU] | Freq: Once | INTRAVENOUS | Status: AC
  Administered 2017-08-15: 2000 [IU] via INTRAVENOUS
  Filled 2017-08-15: qty 2000

## 2017-08-15 NOTE — Progress Notes (Signed)
Patient ID: Jennifer Warner. [name], female   DOB: Jun 04, 1934, 83 y.o.   MRN: [id number]  Interventional Radiology:  Consulted by Dr. Blaine Hamper.  CTA reviewed. My own measurements show RV/LV ratio of 1.0 at most.  Clot burden higher on right.  Currently hemodynamically stable with O2 sat of 96% on 2-3 L O2.  BP stable.  Rather than proceed immediately with catheter directed PE lysis, recommend initial correlation with echocardiography to see if any evidence of right heart strain.  If echo suggestive, could consider starting lytic therapy later in AM, but definitely some increased risk of bleeding complication in patients at this age.  Given current hemodynamic stability, may help to observe on IV heparin while awaiting echo and consider lysis only if echo positive for strain and clinically does not improve on heparin.  We will follow echo results and see patient later in AM.  Eulas Post T. Kathlene Cote, M.D Pager:  [phone]

## 2017-08-15 NOTE — Consult Note (Signed)
Chief Complaint: Patient was seen in consultation today for consideration of EKOS thrombolysis of pulmonary embolus Chief Complaint  Patient presents with  . Chest Pain  . Shortness of Breath   at the request of Dr Blaine Hamper  Referring Physician(s): Dr Gilford Raid  Supervising Physician: Daryll Brod  Patient Status: [facility] - In-pt  History of Present Illness: Jennifer Warner. [name] is a 83 y.o. female   Hx CAD-CABG; HTN; HLD; DM; CKD- 3 SOB and chest pain/syncope B calf cramping yesterday and fell at home Presented to ED 10/29 pm ED: tachy and tachypniec 02 88% RA CTA: IMPRESSION: 1. Positive for acute PE with CT evidence of right heart strain (RV/LV Ratio = 1.11) consistent with at least submassive (intermediate risk) PE. The presence of right heart strain has been associated with an increased risk of morbidity and mortality. Please activate Code PE by paging [phone]. Critical Value/emergent results were called by telephone at the time of interpretation on 08/14/2017 at 9:25 pm to Dr. Virgel Manifold , who verbally acknowledged these results. 2. Faint ground-glass infiltrate in the right upper lobe, question focus of pneumonia or possibly a pulmonary infarct. 3. Right upper lobe granuloma.  Dr Kathlene Cote note after call from Dr Blaine Hamper: 6606 am Consulted by Dr. Blaine Hamper.  CTA reviewed. My own measurements show RV/LV ratio of 1.0 at most.  Clot burden higher on right.  Currently hemodynamically stable with O2 sat of 96% on 2-3 L O2.  BP stable. Rather than proceed immediately with catheter directed PE lysis, recommend initial correlation with echocardiography to see if any evidence of right heart strain.  If echo suggestive, could consider starting lytic therapy later in AM, but definitely some increased risk of bleeding complication in patients at this age.  Given current hemodynamic stability, may help to observe on IV heparin while awaiting echo and consider lysis only if echo positive  for strain and clinically does not improve on heparin.  Echo: 10/30 Study Conclusions - Left ventricle: The cavity size was normal. Wall thickness was   normal. Systolic function was normal. The estimated ejection   fraction was in the range of 55% to 60%. Wall motion was normal;   there were no regional wall motion abnormalities. Doppler   parameters are consistent with elevated mean left atrial filling   pressure. - Right ventricle: The cavity size was moderately dilated. Systolic   function was mildly reduced. - Right atrium: The atrium was mildly dilated. - Tricuspid valve: There was moderate regurgitation. - Pulmonary arteries: Systolic pressure was severely increased. PA   peak pressure: 77 mm Hg (S).  Doppler:  Right Technical Findings: No evidence of deep venous obstruction in the lower extremity. No indirect evidence of obstruction proximal to the inguinal ligament. Left Technical Findings: No evidence of deep venous obstruction in the lower extremity. No indirect evidence of obstruction proximal to the inguinal ligament.  Pt doing much better in room now Started Hep IV last pm 02sat 98 % 2L VSS  Discussed case with Dr Annamaria Boots--- will hold on thrombolysis as pt is feeling better; breathing easier    Past Medical History:  Diagnosis Date  . Acute thyroiditis   . CAD (coronary artery disease)   . CAP (community acquired pneumonia) 12/2012  . Essential hypertension, benign   . GERD (gastroesophageal reflux disease)   . History of blood transfusion 05/2008   "when I had my bypass"  . Hyperkalemia   . Hypertriglyceridemia   . Other secondary thrombocytopenia   .  Polycystic ovaries   . Postsurgical aortocoronary bypass status   . Pure hypercholesterolemia   . Type II diabetes mellitus ([place])   . Urinary tract infection, site not specified     Past Surgical History:  Procedure Laterality Date  . CARDIAC CATHETERIZATION  05/2008  . CARDIAC CATHETERIZATION N/A  05/03/2016   Procedure: Left Heart Cath and Coronary Angiography;  Surgeon: Belva Crome, MD;  Location: [place] [facility];  Service: Cardiovascular;  Laterality: N/A;  . CATARACT EXTRACTION W/ INTRAOCULAR LENS  IMPLANT, BILATERAL Bilateral ~ 2000  . [place]  . CORONARY ARTERY BYPASS GRAFT  05/2008   X2, Hamberg to LAD, SVG to PDA  . DILATION AND CURETTAGE OF UTERUS    . LAPAROSCOPIC CHOLECYSTECTOMY  1990s  . TUBAL LIGATION      Allergies: Levofloxacin  Medications: Prior to Admission medications   Medication Sig Start Date End Date Taking? Authorizing Provider  aspirin EC 81 MG EC tablet Take 1 tablet (81 mg total) by mouth daily. 05/04/16  Yes Arbutus Leas, NP  atorvastatin (LIPITOR) 20 MG tablet Take 20 mg by mouth daily at 6 PM.  02/18/15  Yes [provider]  diphenhydramine-acetaminophen (TYLENOL PM) 25-500 MG TABS Take 2 tablets by mouth at bedtime.   Yes [provider]  fenofibrate (TRICOR) 145 MG tablet TAKE 1 TABLET BY MOUTH EVERY EVENING 05/19/15  Yes [place], Peter M, MD  LANTUS SOLOSTAR 100 UNIT/ML injection Inject 25 Units into the skin at bedtime.  09/22/12  Yes [provider]  metoprolol tartrate (LOPRESSOR) 25 MG tablet Take 1 tablet (25 mg total) by mouth 2 (two) times daily. 04/17/13  Yes [place], Peter M, MD  nitroGLYCERIN (NITROSTAT) 0.4 MG SL tablet Place 1 tablet (0.4 mg total) under the tongue every 5 (five) minutes x 3 doses as needed for chest pain. 05/04/16  Yes Arbutus Leas, NP  omeprazole (PRILOSEC) 20 MG capsule Take 1 capsule (20 mg total) by mouth every evening. 07/14/17  Yes [place], Peter M, MD  Vitamin D, Ergocalciferol, (DRISDOL) 50000 units CAPS capsule Take 50,000 Units by mouth 2 (two) times a week. 04/04/16  Yes [provider]     Family History  Problem Relation Age of Onset  . Liver disease Mother 45       deceased  . Cancer Brother        x3  . Stroke Brother   . Breast cancer Neg Hx     Social  History   Social History  . Marital status: Married    Spouse name: N/A  . Number of children: 2  . Years of education: N/A   Occupational History  . retired- Air traffic controller     2010   Social History Main Topics  . Smoking status: Never Smoker  . Smokeless tobacco: Never Used  . Alcohol use No  . Drug use: No  . Sexual activity: Not Currently   Other Topics Concern  . None   Social History Narrative  . None    Review of Systems: A 12 point ROS discussed and pertinent positives are indicated in the HPI above.  All other systems are negative.  Review of Systems  Constitutional: Positive for activity change and fatigue. Negative for appetite change and fever.  Respiratory: Negative for cough, choking, shortness of breath and wheezing.   Cardiovascular: Negative for chest pain.  Gastrointestinal: Negative for abdominal pain.  Musculoskeletal: Negative for back pain.  Neurological: Positive  for weakness. Negative for dizziness and headaches.  Psychiatric/Behavioral: Negative for behavioral problems and confusion.    Vital Signs: BP (!) 153/69 (BP Location: Right Arm)   Pulse 75   Temp 97.7 F (36.5 C) (Oral)   Resp (!) 24   Ht 5\' 3"  (1.6 m)   Wt 139 lb 1.6 oz (63.1 kg)   SpO2 96%   BMI 24.64 kg/m   Physical Exam  Constitutional: She is oriented to person, place, and time.  Cardiovascular: Normal rate, regular rhythm and normal heart sounds.   Pulmonary/Chest: Effort normal and breath sounds normal.  Abdominal: Soft. Bowel sounds are normal.  Musculoskeletal: Normal range of motion.  Neurological: She is alert and oriented to person, place, and time.  Skin: Skin is warm and dry.  Psychiatric: She has a normal mood and affect. Her behavior is normal. Judgment and thought content normal.  Nursing note and vitals reviewed.   Imaging: Dg Chest 2 View  Result Date: 08/14/2017 CLINICAL DATA:  Chest pain.  Heavy weight on chest. EXAM: CHEST  2 VIEW  COMPARISON:  05/02/2016. FINDINGS: Normal cardiomediastinal silhouette. Prior CABG. No consolidation or edema. No effusion or pneumothorax. Bones unremarkable. Compared with priors, resolution of mid lung zone nodular density. Carotid atherosclerosis. IMPRESSION: No active cardiopulmonary disease. Electronically Signed   By: Staci Righter M.D.   On: 08/14/2017 14:39   Dg Ankle Complete Right  Result Date: 08/14/2017 CLINICAL DATA:  Right ankle injury due to a fall which occurred during episode of syncope 08/13/2017. Initial encounter. EXAM: RIGHT ANKLE - COMPLETE 3+ VIEW COMPARISON:  None. FINDINGS: There is some soft tissue swelling about the lateral aspect of the ankle but no underlying acute bony or joint abnormality is identified. Atherosclerotic calcifications are noted. IMPRESSION: Lateral soft tissue swelling without underlying acute bony abnormality. Electronically Signed   By: Inge Rise M.D.   On: 08/14/2017 14:39   Ct Head Wo Contrast  Result Date: 08/15/2017 CLINICAL DATA:  Syncope with head injury. On IV heparin for diagnosis of pulmonary embolus 2 hours prior. EXAM: CT HEAD WITHOUT CONTRAST TECHNIQUE: Contiguous axial images were obtained from the base of the skull through the vertex without intravenous contrast. COMPARISON:  None. FINDINGS: Brain: Intravascular contrast from recent chest CTA could partially obscure subtle hemorrhage. Allowing for this, no evidence of intracranial hemorrhage. Moderate generalized atrophy and chronic small vessel ischemia. No evidence of acute or territorial ischemia. No hydrocephalus. The basilar cisterns are patent. No evidence of territorial infarct or acute ischemia. No extra-axial or intracranial fluid collection. Vascular: Intravascular contrast present from prior chest CTA. Allowing for this, no hyperdense vessel. Atherosclerotic calcifications noted of skullbase vasculature. Skull: No fracture or focal lesion. Sinuses/Orbits: Paranasal sinuses  and mastoid air cells are clear. The visualized orbits are unremarkable. Bilateral cataract section. Other: None. IMPRESSION: 1.  No acute intracranial abnormality. 2. Generalized atrophy and chronic small vessel ischemia. Electronically Signed   By: Jeb Levering M.D.   On: 08/15/2017 00:16   Ct Angio Chest Pe W And/or Wo Contrast  Result Date: 08/14/2017 CLINICAL DATA:  Dyspnea and fatigue x2 weeks. EXAM: CT ANGIOGRAPHY CHEST WITH CONTRAST TECHNIQUE: Multidetector CT imaging of the chest was performed using the standard protocol during bolus administration of intravenous contrast. Multiplanar CT image reconstructions and MIPs were obtained to evaluate the vascular anatomy. CONTRAST:  80 cc Isovue 370 IV COMPARISON:  05/02/2016 CT and CXR 08/14/2017 FINDINGS: Cardiovascular: Acute pulmonary emboli noted within the distal right and left main pulmonary  arteries extending into both lower lobes and right upper lobe. Evidence of right heart strain with RV/ LV ratio 1.11. Moderate aortic atherosclerosis without aneurysm or dissection. Heart is top-normal in size with coronary arteriosclerosis. Mediastinum/Nodes: No enlarged mediastinal, hilar, or axillary lymph nodes. Thyroid gland, trachea, and esophagus demonstrate no significant findings. Lungs/Pleura: Faint ground-glass infiltrate in the right upper lobe laterally. Stable calcified granuloma in the right upper lobe. Atelectasis and/or scarring in the superior right lower lobe. No effusion or pneumothorax. Upper Abdomen: No acute abnormality.  Cholecystectomy. Musculoskeletal: Status post CABG.  Thoracic spondylosis. Review of the MIP images confirms the above findings. IMPRESSION: 1. Positive for acute PE with CT evidence of right heart strain (RV/LV Ratio = 1.11) consistent with at least submassive (intermediate risk) PE. The presence of right heart strain has been associated with an increased risk of morbidity and mortality. Please activate Code PE by  paging [phone]. Critical Value/emergent results were called by telephone at the time of interpretation on 08/14/2017 at 9:25 pm to Dr. Virgel Manifold , who verbally acknowledged these results. 2. Faint ground-glass infiltrate in the right upper lobe, question focus of pneumonia or possibly a pulmonary infarct. 3. Right upper lobe granuloma. Aortic Atherosclerosis (ICD10-I70.0). Electronically Signed   By: Ashley Royalty M.D.   On: 08/14/2017 21:25    Labs:  CBC:  Recent Labs  08/14/17 1331 08/15/17 0401  WBC 6.9 3.9*  HGB 13.3 12.0  HCT 40.7 36.8  PLT 231 185    COAGS:  Recent Labs  08/15/17 0400  INR 1.14  APTT 55*    BMP:  Recent Labs  08/14/17 1331 08/15/17 0401  NA 140 139  K 4.3 3.8  CL 109 108  CO2 24 23  GLUCOSE 125* 82  BUN 29* 23*  CALCIUM 10.1 9.4  CREATININE 1.13* 1.05*  GFRNONAA 44* 48*  GFRAA 51* 56*    LIVER FUNCTION TESTS: No results for input(s): BILITOT, AST, ALT, ALKPHOS, PROT, ALBUMIN in the last 8760 hours.  TUMOR MARKERS: No results for input(s): AFPTM, CEA, CA199, CHROMGRNA in the last 8760 hours.  Assessment and Plan:  SOB; CP B PE On Hep IV----feeling better already; breathing easier BLE doppler negative; Echo minimal changes Discussed with pt the procedure of EKOS thrombolysis for B Pulm Embolism Risks and benefits. Answered all questions to satisfaction Dr Annamaria Boots does not feel pt is necessary candidate for this procedure at this time; We will check her in am. She is aware if symptoms worsen; we could still consider her in need of this procedure. She is aware.  She is agreeable to HOLD for now  Thank you for this interesting consult.  I greatly enjoyed meeting Audrea Bolte. [name] and look forward to participating in their care.  A copy of this report was sent to the requesting provider on this date.  Electronically Signed: Lavonia Drafts, PA-C 08/15/2017, 4:12 PM   I spent a total of 40 Minutes    in face to face in  clinical consultation, greater than 50% of which was counseling/coordinating care for EKOS thrombolysis

## 2017-08-15 NOTE — ED Notes (Signed)
Doppler at bedside.

## 2017-08-15 NOTE — Progress Notes (Signed)
PROGRESS NOTE    Jennifer Warner. Jennifer Warner  QVZ:[id number] DOB: 1934/10/04 DOA: 08/14/2017 PCP: Foye Spurling, MD   Brief Narrative:  Jennifer Warner. Jennifer Warner is a 83 y.o. female with medical history significant of CAD, s/p of CABG, Hypertension, Hyperlipidemia, Diabetes Mellitus, GERD, Polycystic Ovaries, CKD-3 and other comorbids who presents with shortness breath, chest pain or syncope.  Pt states that she has been having shortness of breath and chest pain in the past 2 weeks. Her chest pain is located in the frontal chest, intermittent, mild, aggravated by exertion or movement. Pt has dry cough, no fever or chills. She denies recent traveling. She states that she has intermittent bilateral calf cramping pain recently. She states that she has lightheadedness, and and had syncope episode and fell yesterday. She states that she injured her head and also has pain in right ankle. No neck pain. No leg edema. Patient denies nausea, vomiting, diarrhea, abdominal pain. No symptoms of UTI or unilateral weakness.  In the ED the pt was found to have  troponin 0.18, WBC 6.9, stable renal function, tachycardia, tachypnea, oxygen saturation to 88% on room air. Chest x-ray negative. X-ray of the right ankle is negative for bony fracture. Dr. Burt Knack of Cardiology was consulted--> recommended to get CTA to r/o PE, which was done and showed submassive PE with CT evidence of right heart strain. Patient is admitted to stepdown as inpatient. IV heparin is started. PCCM was consulted and recommended IR evaluation for Possible Catheter Based Lysis. Per IR, recommending ECHO to evaluate RV Heart Strain prior to considering EKOS. Anticoagulated on Heparin gtt and improved.   Assessment & Plan:   Principal Problem:   PE (pulmonary thromboembolism) ([place]) Active Problems:   CAD (coronary artery disease)   S/P CABG x 2   Hyperlipidemia   Essential hypertension   CKD (chronic kidney disease), stage III (HCC)   Syncope  Elevated troponin   Type II diabetes mellitus with renal manifestations (HCC)  Syncope and Collapse -Likely 2/2 to PE -No Focal Neurological Findings -Head CT w/o showed no acute intracranial abnormality. Generalized atrophy and chronic small vessel ischemia. -Cycle Cardiac Tropoinins -Check ECHO; Done and Pending Read -Check Orthostatics in AM -PT/OT Evaluation and Treat  Acute Submassive PE noted within Right and Left main Pulmonary Arteries -Patient Has O2 Desaturation and Tachycardia -CTA PE shows Positive for acute PE with CT evidence of right heart strain (RV/LV Ratio = 1.11) consistent with at least submassive (intermediate risk) PE. Faint ground-glass infiltrate in the right upper lobe, question focus of pneumonia or possibly a pulmonary infarct. Right upper lobe granuloma. Aortic Atherosclerosis. -No EKOS Therapy and Catheter Directed Lysis yet. Per IR's Dr. Margaretmary Dys Read RV/LV Ratio = 1.0 with Clot Burden on Right -Obtain ECHO to see evidence of Right Heart Strain; If ECHO suggestive could consider starting Lytic Therapy later; ECHO is still pending -C/w Anticoagulation with Heparin gtt -Obtain Lower Extremity Dopplers -C/w Supplemental O2 via [place] [employer] [place] as Tolerated -Maintain O2 Saturations >92% and Continue with Continuous Pulse Oximetry -C/w Anticoagulation with Heparin gtt with Pharmacy Dose  -Continue to Monitor   Acute Respiratory Failure with Hypoxia -C/w Supplemental O2 via [place] as Tolerated -Continuous Pulse Oximetry and Maintain O2 Saturations >92% -Will need Walk Screen at D/C  Elevated Troponin -Patient's POC Troponin was 0.18 and Troponin I was 0.16 -Continue to Trend -Likely elevated in the setting of Acute Submassive PE  CAD s/p CABG -Has Hx of LIMA to LAD and SVG to  RCA in 2009 -Last Cardiac Cath was 2017 -C/w NTG 0.4 mg SL q4min prn, ASA 81 mg po Daily, Metoprolol 25 mg po BID; Not on ARB because of Hyperkalemia -Initial Troponin was 0.18;  Repeat Troponin I was 0.16; Continue to Trend Troponin I's  -Cardiology was planning Cardiac Catheterization if PE Study was Negative but was Positive   CKD Stage 3 -Baseline Cr is 1.1-1.2 -Patient's BUN/Cr went from 29/1.13 -> 23/1.05 -Avoid Nephrotoxic Medications if possible -Repeat CMP in AM  HTN -C/w Metoprolol 25 mg po BID; Not on ARB due to Hyperkalemia -C/w Hydralazine 5 mg IV q2hprn SBP >175 -Continue to Monitor   HLD  -Lipid Panel showed Cholesterol of 142, HDL of 28, LDL of 58, TG of 281, and VLDL of 56 -C/w Atorvastatin 20 mg po qDaily and Fenofibrate 160 mg po Daily  Insulin Diabetes Mellitus Type 2 -C/w Insulin Glargine 15 units sq Daily qHS -C/w Sensitive Novolog SSI AC -CBG's ranging from 123-137 -HbA1c was 6.1  GERD -C/w Pantoprazole po 40 mg Daily  DVT prophylaxis: Anticoagulated with Heparin gtt Code Status: FULL CODE Family Communication: No family present at bedside Disposition Plan: Remain in SDU   Consultants:   Cardiology Dr. Burt Knack  PCCM/Pulmonary Dr. Gilford Raid  Interventional Radiology Dr. Kathlene Cote   Procedures:  ECHOCARDIOGRAM  Antimicrobials:  Anti-infectives    None     Subjective: Seen and examined in ED as she was awaiting for a bed and stated she was feeling better. No CP today and states she gets SOB when exerting herself. No lightheadedness or dizziness toady. No other concerns or complaints at this time.   Objective: Vitals:   08/15/17 0830 08/15/17 0900 08/15/17 0930 08/15/17 1000  BP: (!) 153/71 (!) 154/93 (!) 142/80 (!) 130/91  Pulse: 73 81 71 72  Resp: (!) 21 17 (!) 24 (!) 22  Temp:      TempSrc:      SpO2: 100% 98% 100% 100%  Weight:      Height:       No intake or output data in the 24 hours ending 08/15/17 1122 Filed Weights   08/14/17 1532  Weight: 63 kg (139 lb)   Examination: Physical Exam:  Constitutional: WN/WD Caucasian female in NAD and appears calm and comfortable Eyes: Lids and conjunctivae  normal, sclerae anicteric  ENMT: External Ears, Nose appear normal. Grossly normal hearing. Mucous membranes are moist.  Neck: Appears normal, supple, no cervical masses, normal ROM, no appreciable thyromegaly Respiratory: Diminished to auscultation bilaterally, no wheezing, rales, rhonchi or crackles. Slightly increased respiratory effort. No accessory muscle use. Wearing Supplemental O2 via [place] Cardiovascular: RRR, no murmurs / rubs / gallops. S1 and S2 auscultated. No appreciable extremity edema. Abdomen: Soft, non-tender, non-distended. No masses palpated. No appreciable hepatosplenomegaly. Bowel sounds positive x4.  GU: Deferred. Musculoskeletal: No clubbing / cyanosis of digits/nails. No joint deformity upper and lower extremities. Good ROM, no contractures.  Skin: No rashes, lesions, ulcers on a limited skin evaluation. No induration; Warm and dry.  Neurologic: CN 2-12 grossly intact with no focal deficits.  Romberg sign and cerebellar reflexes not assessed.  Psychiatric: Normal judgment and insight. Alert and oriented x 3. Normal mood and appropriate affect.   Data Reviewed: I have personally reviewed following labs and imaging studies  CBC:  Recent Labs Lab 08/14/17 1331 08/15/17 0401  WBC 6.9 3.9*  HGB 13.3 12.0  HCT 40.7 36.8  MCV 94.4 94.4  PLT 231 749   Basic Metabolic Panel:  Recent Labs Lab 08/14/17 1331 08/15/17 0401  NA 140 139  K 4.3 3.8  CL 109 108  CO2 24 23  GLUCOSE 125* 82  BUN 29* 23*  CREATININE 1.13* 1.05*  CALCIUM 10.1 9.4   GFR: Estimated Creatinine Clearance: 37.6 mL/min (A) (by C-G formula based on SCr of 1.05 mg/dL (H)). Liver Function Tests: No results for input(s): AST, ALT, ALKPHOS, BILITOT, PROT, ALBUMIN in the last 168 hours. No results for input(s): LIPASE, AMYLASE in the last 168 hours. No results for input(s): AMMONIA in the last 168 hours. Coagulation Profile:  Recent Labs Lab 08/15/17 0400  INR 1.14   Cardiac  Enzymes:  Recent Labs Lab 08/15/17 0401  TROPONINI 0.16*   BNP (last 3 results) No results for input(s): PROBNP in the last 8760 hours. HbA1C:  Recent Labs  08/15/17 0401  HGBA1C 6.1*   CBG:  Recent Labs Lab 08/15/17 0030 08/15/17 0855  GLUCAP 137* 123*   Lipid Profile:  Recent Labs  08/15/17 0401  CHOL 142  HDL 28*  LDLCALC 58  TRIG 281*  CHOLHDL 5.1   Thyroid Function Tests: No results for input(s): TSH, T4TOTAL, FREET4, T3FREE, THYROIDAB in the last 72 hours. Anemia Panel: No results for input(s): VITAMINB12, FOLATE, FERRITIN, TIBC, IRON, RETICCTPCT in the last 72 hours. Sepsis Labs: No results for input(s): PROCALCITON, LATICACIDVEN in the last 168 hours.  No results found for this or any previous visit (from the past 240 hour(s)).   Radiology Studies: Dg Chest 2 View  Result Date: 08/14/2017 CLINICAL DATA:  Chest pain.  Heavy weight on chest. EXAM: CHEST  2 VIEW COMPARISON:  05/02/2016. FINDINGS: Normal cardiomediastinal silhouette. Prior CABG. No consolidation or edema. No effusion or pneumothorax. Bones unremarkable. Compared with priors, resolution of mid lung zone nodular density. Carotid atherosclerosis. IMPRESSION: No active cardiopulmonary disease. Electronically Signed   By: Staci Righter M.D.   On: 08/14/2017 14:39   Dg Ankle Complete Right  Result Date: 08/14/2017 CLINICAL DATA:  Right ankle injury due to a fall which occurred during episode of syncope 08/13/2017. Initial encounter. EXAM: RIGHT ANKLE - COMPLETE 3+ VIEW COMPARISON:  None. FINDINGS: There is some soft tissue swelling about the lateral aspect of the ankle but no underlying acute bony or joint abnormality is identified. Atherosclerotic calcifications are noted. IMPRESSION: Lateral soft tissue swelling without underlying acute bony abnormality. Electronically Signed   By: Inge Rise M.D.   On: 08/14/2017 14:39   Ct Head Wo Contrast  Result Date: 08/15/2017 CLINICAL DATA:   Syncope with head injury. On IV heparin for diagnosis of pulmonary embolus 2 hours prior. EXAM: CT HEAD WITHOUT CONTRAST TECHNIQUE: Contiguous axial images were obtained from the base of the skull through the vertex without intravenous contrast. COMPARISON:  None. FINDINGS: Brain: Intravascular contrast from recent chest CTA could partially obscure subtle hemorrhage. Allowing for this, no evidence of intracranial hemorrhage. Moderate generalized atrophy and chronic small vessel ischemia. No evidence of acute or territorial ischemia. No hydrocephalus. The basilar cisterns are patent. No evidence of territorial infarct or acute ischemia. No extra-axial or intracranial fluid collection. Vascular: Intravascular contrast present from prior chest CTA. Allowing for this, no hyperdense vessel. Atherosclerotic calcifications noted of skullbase vasculature. Skull: No fracture or focal lesion. Sinuses/Orbits: Paranasal sinuses and mastoid air cells are clear. The visualized orbits are unremarkable. Bilateral cataract section. Other: None. IMPRESSION: 1.  No acute intracranial abnormality. 2. Generalized atrophy and chronic small vessel ischemia. Electronically Signed   By: Jeb Levering  M.D.   On: 08/15/2017 00:16   Ct Angio Chest Pe W And/or Wo Contrast  Result Date: 08/14/2017 CLINICAL DATA:  Dyspnea and fatigue x2 weeks. EXAM: CT ANGIOGRAPHY CHEST WITH CONTRAST TECHNIQUE: Multidetector CT imaging of the chest was performed using the standard protocol during bolus administration of intravenous contrast. Multiplanar CT image reconstructions and MIPs were obtained to evaluate the vascular anatomy. CONTRAST:  80 cc Isovue 370 IV COMPARISON:  05/02/2016 CT and CXR 08/14/2017 FINDINGS: Cardiovascular: Acute pulmonary emboli noted within the distal right and left main pulmonary arteries extending into both lower lobes and right upper lobe. Evidence of right heart strain with RV/ LV ratio 1.11. Moderate aortic  atherosclerosis without aneurysm or dissection. Heart is top-normal in size with coronary arteriosclerosis. Mediastinum/Nodes: No enlarged mediastinal, hilar, or axillary lymph nodes. Thyroid gland, trachea, and esophagus demonstrate no significant findings. Lungs/Pleura: Faint ground-glass infiltrate in the right upper lobe laterally. Stable calcified granuloma in the right upper lobe. Atelectasis and/or scarring in the superior right lower lobe. No effusion or pneumothorax. Upper Abdomen: No acute abnormality.  Cholecystectomy. Musculoskeletal: Status post CABG.  Thoracic spondylosis. Review of the MIP images confirms the above findings. IMPRESSION: 1. Positive for acute PE with CT evidence of right heart strain (RV/LV Ratio = 1.11) consistent with at least submassive (intermediate risk) PE. The presence of right heart strain has been associated with an increased risk of morbidity and mortality. Please activate Code PE by paging [phone]. Critical Value/emergent results were called by telephone at the time of interpretation on 08/14/2017 at 9:25 pm to Dr. Virgel Manifold , who verbally acknowledged these results. 2. Faint ground-glass infiltrate in the right upper lobe, question focus of pneumonia or possibly a pulmonary infarct. 3. Right upper lobe granuloma. Aortic Atherosclerosis (ICD10-I70.0). Electronically Signed   By: Ashley Royalty M.D.   On: 08/14/2017 21:25   Scheduled Meds: . aspirin EC  81 mg Oral Daily  . atorvastatin  20 mg Oral q1800  . fenofibrate  160 mg Oral Daily  . insulin aspart  0-9 Units Subcutaneous TID WC  . insulin glargine  15 Units Subcutaneous QHS  . metoprolol tartrate  25 mg Oral BID  . pantoprazole  40 mg Oral Daily   Continuous Infusions: . sodium chloride 75 mL/hr at 08/15/17 0941  . heparin 1,000 Units/hr (08/15/17 0608)    LOS: 1 day   Kerney Elbe, DO [facility] Pager [phone]  If 7PM-7AM, please contact  night-coverage [email or web address] Password [email or web address] 08/15/2017, 11:22 AM

## 2017-08-15 NOTE — Progress Notes (Signed)
[place] for heparin Indication: PE  Allergies  Allergen Reactions  . Levofloxacin Nausea And Vomiting    Patient Measurements: Height: 5\' 3"  (160 cm) Weight: 139 lb 1.6 oz (63.1 kg) IBW/kg (Calculated) : 52.4 Heparin Dosing Weight: 63 kg  Vital Signs: Temp: 97.7 F (36.5 C) (10/30 2013) Temp Source: Oral (10/30 2013) BP: 147/65 (10/30 2013) Pulse Rate: 87 (10/30 2013)  Labs:  Recent Labs  08/14/17 1331 08/15/17 0400 08/15/17 0401 08/15/17 1209 08/15/17 2009  HGB 13.3  --  12.0  --   --   HCT 40.7  --  36.8  --   --   PLT 231  --  185  --   --   APTT  --  55*  --   --   --   LABPROT  --  14.5  --   --   --   INR  --  1.14  --   --   --   HEPARINUNFRC  --   --  0.12* 0.53 0.49  CREATININE 1.13*  --  1.05*  --   --   TROPONINI  --   --  0.16* 0.13*  --     Estimated Creatinine Clearance: 37.6 mL/min (A) (by C-G formula based on SCr of 1.05 mg/dL (H)).  Assessment: 83 y.o. female on heparin drip for CT finding of acute submassive PE w/ R heart strain (unprovoked). No anticoagulation prior to admission.   Confirmatory heparin level is therapeutic at 0.49 on 1000 units/hr. No bleeding noted.  Goal of Therapy:  Heparin level 0.3-0.7 units/ml Monitor platelets by anticoagulation protocol: Yes   Plan:  Continue heparin infusion at 1000 units/hr Monitor daily heparin level, CBC, and s/sx of bleeding Follow up on plan for outpatient anticoagulation   Renold Genta, PharmD, BCPS Clinical Pharmacist Phone for today - [place] - [phone] 08/15/2017 9:10 PM

## 2017-08-15 NOTE — Progress Notes (Signed)
[place] for heparin Indication: PE  Allergies  Allergen Reactions  . Levofloxacin Nausea And Vomiting    Patient Measurements: Height: 5\' 3"  (160 cm) Weight: 139 lb (63 kg) IBW/kg (Calculated) : 52.4 Heparin Dosing Weight: 63 kg  Vital Signs: BP: 121/64 (10/30 0530) Pulse Rate: 64 (10/30 0530)  Labs:  Recent Labs  08/14/17 1331 08/15/17 0400 08/15/17 0401  HGB 13.3  --  12.0  HCT 40.7  --  36.8  PLT 231  --  185  APTT  --  55*  --   LABPROT  --  14.5  --   INR  --  1.14  --   HEPARINUNFRC  --   --  0.12*  CREATININE 1.13*  --   --     Estimated Creatinine Clearance: 34.9 mL/min (A) (by C-G formula based on SCr of 1.13 mg/dL (H)).  Assessment: 83 y.o. female with B PE for heparin  Goal of Therapy:  Heparin level 0.3-0.7 units/ml Monitor platelets by anticoagulation protocol: Yes   Plan:  Heparin 2000 units IV bolus, then increase heparin  1000 units/hr Check heparin level in 6 hours.   Saifullah Jolley, [place] Curb 08/15/2017,5:55 AM

## 2017-08-15 NOTE — Progress Notes (Addendum)
[place] for heparin Indication: PE  Allergies  Allergen Reactions  . Levofloxacin Nausea And Vomiting    Patient Measurements: Height: 5\' 3"  (160 cm) Weight: 139 lb 1.6 oz (63.1 kg) IBW/kg (Calculated) : 52.4 Heparin Dosing Weight: 63 kg  Vital Signs: Temp: 97.7 F (36.5 C) (10/30 1131) Temp Source: Oral (10/30 1131) BP: 150/84 (10/30 1131) Pulse Rate: 72 (10/30 1000)  Labs:  Recent Labs  08/14/17 1331 08/15/17 0400 08/15/17 0401 08/15/17 1209  HGB 13.3  --  12.0  --   HCT 40.7  --  36.8  --   PLT 231  --  185  --   APTT  --  55*  --   --   LABPROT  --  14.5  --   --   INR  --  1.14  --   --   HEPARINUNFRC  --   --  0.12* 0.53  CREATININE 1.13*  --  1.05*  --   TROPONINI  --   --  0.16*  --     Estimated Creatinine Clearance: 37.6 mL/min (A) (by C-G formula based on SCr of 1.05 mg/dL (H)).  Assessment: 83 y.o. female on heparin for CT finding of acute submassive PE w/ R heart strain (unprovoked). No anticoagulation prior to admission. Previous heparin level had been below goal range, prompting rate increase. Repeat level came back at 0.53, within therapeutic range, on 1000 units/hr.  No interruptions noted to the infusion. CBC remains stable. No signs/symptoms of bleeding.  Goal of Therapy:  Heparin level 0.3-0.7 units/ml Monitor platelets by anticoagulation protocol: Yes   Plan:  Continue heparin infusion at 1000 units/hr Check 8 hour level to confirm within goal range Monitor daily HL, CBC, and s/sx of bleeding Follow up on plan for outpatient anticoagulation  Doylene Canard, PharmD Clinical Pharmacist  Pager: [phone] Clinical Phone for 08/15/2017 until 3:30pm: [phone] If after 3:30pm, please call main pharmacy at [phone]

## 2017-08-15 NOTE — Progress Notes (Signed)
PT Cancellation Note  Patient Details Name: Jennifer Warner. [name] MRN: [id number] DOB: May 09, 1934   Cancelled Treatment:    Reason Eval/Treat Not Completed: Patient not medically ready pt with Bil PEs and currently not therapeutic on Heparin. Will await until this occurs prior to PT evaluation per protocol. Will follow.   Marguarite Arbour A Sherrol Vicars 08/15/2017, 12:38 PM Wray Kearns, [place], DPT [phone]

## 2017-08-15 NOTE — Progress Notes (Signed)
Bilateral lower extremity venous duplex complete, please see CV Proc or Results Review tab for preliminary results.  Lita Mains- RDMS, RVT 2:12 PM  08/15/2017

## 2017-08-15 NOTE — Progress Notes (Signed)
  Echocardiogram 2D Echocardiogram has been performed.  Jennifer Warner 08/15/2017, 10:51 AM

## 2017-08-15 NOTE — ED Notes (Signed)
Vascular to come doppler pt in room

## 2017-08-16 DIAGNOSIS — I10 Essential (primary) hypertension: Secondary | ICD-10-CM

## 2017-08-16 DIAGNOSIS — Z794 Long term (current) use of insulin: Secondary | ICD-10-CM

## 2017-08-16 DIAGNOSIS — E1122 Type 2 diabetes mellitus with diabetic chronic kidney disease: Secondary | ICD-10-CM

## 2017-08-16 DIAGNOSIS — N183 Chronic kidney disease, stage 3 (moderate): Secondary | ICD-10-CM

## 2017-08-16 DIAGNOSIS — I2609 Other pulmonary embolism with acute cor pulmonale: Secondary | ICD-10-CM

## 2017-08-16 DIAGNOSIS — E785 Hyperlipidemia, unspecified: Secondary | ICD-10-CM

## 2017-08-16 DIAGNOSIS — I25708 Atherosclerosis of coronary artery bypass graft(s), unspecified, with other forms of angina pectoris: Secondary | ICD-10-CM

## 2017-08-16 DIAGNOSIS — Z951 Presence of aortocoronary bypass graft: Secondary | ICD-10-CM

## 2017-08-16 DIAGNOSIS — K219 Gastro-esophageal reflux disease without esophagitis: Secondary | ICD-10-CM

## 2017-08-16 DIAGNOSIS — I2699 Other pulmonary embolism without acute cor pulmonale: Principal | ICD-10-CM

## 2017-08-16 LAB — CBC WITH DIFFERENTIAL/PLATELET
Basophils Absolute: 0 10*3/uL (ref 0.0–0.1)
Basophils Relative: 1 %
EOS ABS: 0.2 10*3/uL (ref 0.0–0.7)
EOS PCT: 6 %
HCT: 35.7 % — ABNORMAL LOW (ref 36.0–46.0)
Hemoglobin: 11.6 g/dL — ABNORMAL LOW (ref 12.0–15.0)
LYMPHS ABS: 1.3 10*3/uL (ref 0.7–4.0)
Lymphocytes Relative: 36 %
MCH: 30.8 pg (ref 26.0–34.0)
MCHC: 32.5 g/dL (ref 30.0–36.0)
MCV: 94.7 fL (ref 78.0–100.0)
MONO ABS: 0.4 10*3/uL (ref 0.1–1.0)
MONOS PCT: 11 %
Neutro Abs: 1.7 10*3/uL (ref 1.7–7.7)
Neutrophils Relative %: 46 %
PLATELETS: 179 10*3/uL (ref 150–400)
RBC: 3.77 MIL/uL — AB (ref 3.87–5.11)
RDW: 12.9 % (ref 11.5–15.5)
WBC: 3.6 10*3/uL — AB (ref 4.0–10.5)

## 2017-08-16 LAB — COMPREHENSIVE METABOLIC PANEL
ALT: 24 U/L (ref 14–54)
ANION GAP: 6 (ref 5–15)
AST: 42 U/L — ABNORMAL HIGH (ref 15–41)
Albumin: 3 g/dL — ABNORMAL LOW (ref 3.5–5.0)
Alkaline Phosphatase: 38 U/L (ref 38–126)
BUN: 18 mg/dL (ref 6–20)
CHLORIDE: 109 mmol/L (ref 101–111)
CO2: 25 mmol/L (ref 22–32)
CREATININE: 0.95 mg/dL (ref 0.44–1.00)
Calcium: 9.4 mg/dL (ref 8.9–10.3)
GFR, EST NON AFRICAN AMERICAN: 55 mL/min — AB (ref >60)
Glucose, Bld: 141 mg/dL — ABNORMAL HIGH (ref 65–99)
POTASSIUM: 3.9 mmol/L (ref 3.5–5.1)
SODIUM: 140 mmol/L (ref 135–145)
Total Bilirubin: 0.6 mg/dL (ref 0.3–1.2)
Total Protein: 5.7 g/dL — ABNORMAL LOW (ref 6.5–8.1)

## 2017-08-16 LAB — GLUCOSE, CAPILLARY
Glucose-Capillary: 125 mg/dL — ABNORMAL HIGH (ref 65–99)
Glucose-Capillary: 144 mg/dL — ABNORMAL HIGH (ref 65–99)
Glucose-Capillary: 156 mg/dL — ABNORMAL HIGH (ref 65–99)
Glucose-Capillary: 144 mg/dL — ABNORMAL HIGH (ref 65–99)

## 2017-08-16 LAB — HEPARIN LEVEL (UNFRACTIONATED): HEPARIN UNFRACTIONATED: 0.46 [IU]/mL (ref 0.30–0.70)

## 2017-08-16 LAB — MAGNESIUM: MAGNESIUM: 1.5 mg/dL — AB (ref 1.7–2.4)

## 2017-08-16 LAB — PHOSPHORUS: Phosphorus: 3.9 mg/dL (ref 2.5–4.6)

## 2017-08-16 MED ORDER — MAGNESIUM SULFATE 2 GM/50ML IV SOLN
2.0000 g | Freq: Once | INTRAVENOUS | Status: AC
  Administered 2017-08-16: 2 g via INTRAVENOUS
  Filled 2017-08-16: qty 50

## 2017-08-16 MED ORDER — WARFARIN VIDEO
Freq: Once | Status: AC
Start: 2017-08-16 — End: 2017-08-16
  Administered 2017-08-16: 21:00:00

## 2017-08-16 MED ORDER — COUMADIN BOOK
Freq: Once | Status: AC
Start: 2017-08-16 — End: 2017-08-16
  Administered 2017-08-16: 21:00:00
  Filled 2017-08-16: qty 1

## 2017-08-16 MED ORDER — WARFARIN - PHARMACIST DOSING INPATIENT: Freq: Every day | Status: DC

## 2017-08-16 MED ORDER — WARFARIN SODIUM 5 MG PO TABS
5.0000 mg | ORAL_TABLET | Freq: Once | ORAL | Status: AC
  Administered 2017-08-16: 5 mg via ORAL
  Filled 2017-08-16: qty 1

## 2017-08-16 NOTE — Progress Notes (Signed)
PROGRESS NOTE    Jennifer Warner  YPP:[id number] DOB: [birth year] DOA: 08/14/2017 PCP: Foye Spurling, MD   Brief Narrative: Jennifer Warner. Warner is a 83 y.o. is a female with a history of CAD status post CABG hypertension, hyperlipidemia, diabetes mellitus, GERD, polycystic.  She presented with dyspnea, chest pain and syncope.  She was found to have an acute submassive coronary embolism.  There was evidence of right heart strain on CT scan with some evidence of mildly decreased right ventricle function on.  Radiology was consulted for thrombolysis, but have recommended against this procedure since patient is stable.   Assessment & Plan:   Principal Problem:   PE (pulmonary thromboembolism) ([place]) Active Problems:   CAD (coronary artery disease)   S/P CABG x 2   Hyperlipidemia   Essential hypertension   CKD (chronic kidney disease), stage III (HCC)   Syncope   Elevated troponin   Type II diabetes mellitus with renal manifestations (Jennifer Warner)   Pulmonary embolism Appears unprovoked.  Lower extremity venous Dopplers negative for acute DVT.  Currently on heparin drip.  Evidence of right heart strain on CT scan with evidence of mildly reduced right ventricle on echocardiogram.  Interventional radiology consulted for possible thrombolysis but recommend against this at this time. -Continue heparin drip; consult pharmacy for outpatient anticoagulation recommendations  Syncope and collapse Secondary to PE. CT head negative for acute process. -Orthostatic vitals  Acute respiratory failure with hypoxia Secondary to PE -Continue oxygen therapy as needed  Elevated troponin Likely secondary to pulmonary embolism.  Patient with a history of CAD with CABG 9 years ago.  No chest pain.  Troponin flat.  CAD status post CABG History of CABG in 2009. -Continue metoprolol 25 mg p.o.  CKD stage III Baseline creatinine of 1.1-1.2.  Stable.  Essential hypertension Normotensive and sometimes  slightly hypertensive. -Continue metoprolol 25 mg p.o. twice daily and hydralazine 5 mg IV as needed  Hyperlipidemia Stable. -Continue atorvastatin 20 mg p.o. daily and fenofibrate 160 mg p.o. Daily  Diabetes mellitus type 2, insulin-dependent Last hemoglobin A1c of 6.1% -Continue Lantus 50 units nightly -Continue sliding scale insulin  GERD Asymptomatic -Continue Protonix 40 mg p.o. daily   DVT prophylaxis: Heparin drip Code Status: Full code Family Communication: None at bedside Disposition Plan: Discharge home or SNF pending PT evaluation and anticoagulation therapy   Consultants:   Interventional radiology  Critical care medicine  Cardiology  Procedures:   Echocardiogram (08/15/17)  Study Conclusions  - Left ventricle: The cavity size was normal. Wall thickness was   normal. Systolic function was normal. The estimated ejection   fraction was in the range of 55% to 60%. Wall motion was normal;   there were no regional wall motion abnormalities. Doppler   parameters are consistent with elevated mean left atrial filling   pressure. - Right ventricle: The cavity size was moderately dilated. Systolic   function was mildly reduced. - Right atrium: The atrium was mildly dilated. - Tricuspid valve: There was moderate regurgitation. - Pulmonary arteries: Systolic pressure was severely increased. PA   peak pressure: 77 mm Hg (S).    Antimicrobials:  None   Subjective: Patient reports no chest pain.  She had some dyspnea earlier today while ambulating.  No spontaneous bleeding.  Objective: Vitals:   08/15/17 2211 08/15/17 2214 08/15/17 2344 08/16/17 0525  BP:   132/62 119/65  Pulse: (!) 101 97 81 83  Resp: 19 20 (!) 24 (!) 21  Temp:    97.8  F (36.6 C)  TempSrc:    Oral  SpO2: (!) 80% 94% 99% 94%  Weight:    63 kg (139 lb)  Height:        Intake/Output Summary (Last 24 hours) at 08/16/17 0942 Last data filed at 08/16/17 0557  Gross per 24 hour    Intake          2334.29 ml  Output              500 ml  Net          1834.29 ml   Filed Weights   08/14/17 1532 08/15/17 1131 08/16/17 0525  Weight: 63 kg (139 lb) 63.1 kg (139 lb 1.6 oz) 63 kg (139 lb)    Examination:  General exam: Appears calm and comfortable Respiratory system: Clear to auscultation. Respiratory effort normal. Cardiovascular system: S1 & S2 heard, RRR. No murmurs, rubs, gallops or clicks. Gastrointestinal system: Abdomen is nondistended, soft and nontender. No organomegaly or masses felt. Normal bowel sounds heard. Central nervous system: Alert and oriented. No focal neurological deficits. Extremities: No edema. No calf tenderness Skin: No cyanosis. No rashes Psychiatry: Judgement and insight appear normal. Mood & affect appropriate.     Data Reviewed: I have personally reviewed following labs and imaging studies  CBC:  Recent Labs Lab 08/14/17 1331 08/15/17 0401 08/16/17 0331  WBC 6.9 3.9* 3.6*  NEUTROABS  --   --  1.7  HGB 13.3 12.0 11.6*  HCT 40.7 36.8 35.7*  MCV 94.4 94.4 94.7  PLT 231 185 992   Basic Metabolic Panel:  Recent Labs Lab 08/14/17 1331 08/15/17 0401 08/16/17 0331  NA 140 139 140  K 4.3 3.8 3.9  CL 109 108 109  CO2 24 23 25   GLUCOSE 125* 82 141*  BUN 29* 23* 18  CREATININE 1.13* 1.05* 0.95  CALCIUM 10.1 9.4 9.4  MG  --   --  1.5*  PHOS  --   --  3.9   GFR: Estimated Creatinine Clearance: 41.6 mL/min (by C-G formula based on SCr of 0.95 mg/dL). Liver Function Tests:  Recent Labs Lab 08/16/17 0331  AST 42*  ALT 24  ALKPHOS 38  BILITOT 0.6  PROT 5.7*  ALBUMIN 3.0*   No results for input(s): LIPASE, AMYLASE in the last 168 hours. No results for input(s): AMMONIA in the last 168 hours. Coagulation Profile:  Recent Labs Lab 08/15/17 0400  INR 1.14   Cardiac Enzymes:  Recent Labs Lab 08/15/17 0401 08/15/17 1209  TROPONINI 0.16* 0.13*   BNP (last 3 results) No results for input(s): PROBNP in the  last 8760 hours. HbA1C:  Recent Labs  08/15/17 0401  HGBA1C 6.1*   CBG:  Recent Labs Lab 08/15/17 0855 08/15/17 1126 08/15/17 1620 08/15/17 2035 08/16/17 0830  GLUCAP 123* 74 82 122* 125*   Lipid Profile:  Recent Labs  08/15/17 0401  CHOL 142  HDL 28*  LDLCALC 58  TRIG 281*  CHOLHDL 5.1   Thyroid Function Tests: No results for input(s): TSH, T4TOTAL, FREET4, T3FREE, THYROIDAB in the last 72 hours. Anemia Panel: No results for input(s): VITAMINB12, FOLATE, FERRITIN, TIBC, IRON, RETICCTPCT in the last 72 hours. Sepsis Labs: No results for input(s): PROCALCITON, LATICACIDVEN in the last 168 hours.  Recent Results (from the past 240 hour(s))  MRSA PCR Screening     Status: None   Collection Time: 08/15/17 11:48 AM  Result Value Ref Range Status   MRSA by PCR NEGATIVE NEGATIVE Final  Comment:        The GeneXpert MRSA Assay (FDA approved for NASAL specimens only), is one component of a comprehensive MRSA colonization surveillance program. It is not intended to diagnose MRSA infection nor to guide or monitor treatment for MRSA infections.          Radiology Studies: Dg Chest 2 View  Result Date: 08/14/2017 CLINICAL DATA:  Chest pain.  Heavy weight on chest. EXAM: CHEST  2 VIEW COMPARISON:  05/02/2016. FINDINGS: Normal cardiomediastinal silhouette. Prior CABG. No consolidation or edema. No effusion or pneumothorax. Bones unremarkable. Compared with priors, resolution of mid lung zone nodular density. Carotid atherosclerosis. IMPRESSION: No active cardiopulmonary disease. Electronically Signed   By: Staci Righter M.D.   On: 08/14/2017 14:39   Dg Ankle Complete Right  Result Date: 08/14/2017 CLINICAL DATA:  Right ankle injury due to a fall which occurred during episode of syncope 08/13/2017. Initial encounter. EXAM: RIGHT ANKLE - COMPLETE 3+ VIEW COMPARISON:  None. FINDINGS: There is some soft tissue swelling about the lateral aspect of the ankle but no  underlying acute bony or joint abnormality is identified. Atherosclerotic calcifications are noted. IMPRESSION: Lateral soft tissue swelling without underlying acute bony abnormality. Electronically Signed   By: Inge Rise M.D.   On: 08/14/2017 14:39   Ct Head Wo Contrast  Result Date: 08/15/2017 CLINICAL DATA:  Syncope with head injury. On IV heparin for diagnosis of pulmonary embolus 2 hours prior. EXAM: CT HEAD WITHOUT CONTRAST TECHNIQUE: Contiguous axial images were obtained from the base of the skull through the vertex without intravenous contrast. COMPARISON:  None. FINDINGS: Brain: Intravascular contrast from recent chest CTA could partially obscure subtle hemorrhage. Allowing for this, no evidence of intracranial hemorrhage. Moderate generalized atrophy and chronic small vessel ischemia. No evidence of acute or territorial ischemia. No hydrocephalus. The basilar cisterns are patent. No evidence of territorial infarct or acute ischemia. No extra-axial or intracranial fluid collection. Vascular: Intravascular contrast present from prior chest CTA. Allowing for this, no hyperdense vessel. Atherosclerotic calcifications noted of skullbase vasculature. Skull: No fracture or focal lesion. Sinuses/Orbits: Paranasal sinuses and mastoid air cells are clear. The visualized orbits are unremarkable. Bilateral cataract section. Other: None. IMPRESSION: 1.  No acute intracranial abnormality. 2. Generalized atrophy and chronic small vessel ischemia. Electronically Signed   By: Jeb Levering M.D.   On: 08/15/2017 00:16   Ct Angio Chest Pe W And/or Wo Contrast  Result Date: 08/14/2017 CLINICAL DATA:  Dyspnea and fatigue x2 weeks. EXAM: CT ANGIOGRAPHY CHEST WITH CONTRAST TECHNIQUE: Multidetector CT imaging of the chest was performed using the standard protocol during bolus administration of intravenous contrast. Multiplanar CT image reconstructions and MIPs were obtained to evaluate the vascular anatomy.  CONTRAST:  80 cc Isovue 370 IV COMPARISON:  05/02/2016 CT and CXR 08/14/2017 FINDINGS: Cardiovascular: Acute pulmonary emboli noted within the distal right and left main pulmonary arteries extending into both lower lobes and right upper lobe. Evidence of right heart strain with RV/ LV ratio 1.11. Moderate aortic atherosclerosis without aneurysm or dissection. Heart is top-normal in size with coronary arteriosclerosis. Mediastinum/Nodes: No enlarged mediastinal, hilar, or axillary lymph nodes. Thyroid gland, trachea, and esophagus demonstrate no significant findings. Lungs/Pleura: Faint ground-glass infiltrate in the right upper lobe laterally. Stable calcified granuloma in the right upper lobe. Atelectasis and/or scarring in the superior right lower lobe. No effusion or pneumothorax. Upper Abdomen: No acute abnormality.  Cholecystectomy. Musculoskeletal: Status post CABG.  Thoracic spondylosis. Review of the MIP images confirms the  above findings. IMPRESSION: 1. Positive for acute PE with CT evidence of right heart strain (RV/LV Ratio = 1.11) consistent with at least submassive (intermediate risk) PE. The presence of right heart strain has been associated with an increased risk of morbidity and mortality. Please activate Code PE by paging [phone]. Critical Value/emergent results were called by telephone at the time of interpretation on 08/14/2017 at 9:25 pm to Dr. Virgel Manifold , who verbally acknowledged these results. 2. Faint ground-glass infiltrate in the right upper lobe, question focus of pneumonia or possibly a pulmonary infarct. 3. Right upper lobe granuloma. Aortic Atherosclerosis (ICD10-I70.0). Electronically Signed   By: Ashley Royalty M.D.   On: 08/14/2017 21:25        Scheduled Meds: . aspirin EC  81 mg Oral Daily  . atorvastatin  20 mg Oral q1800  . fenofibrate  160 mg Oral Daily  . insulin aspart  0-9 Units Subcutaneous TID WC  . insulin glargine  15 Units Subcutaneous QHS  . mouth  rinse  15 mL Mouth Rinse BID  . metoprolol tartrate  25 mg Oral BID  . pantoprazole  40 mg Oral Daily   Continuous Infusions: . sodium chloride 75 mL/hr at 08/15/17 2358  . heparin 1,000 Units/hr (08/15/17 1927)  . magnesium sulfate 1 - 4 g bolus IVPB 2 g (08/16/17 0859)     LOS: 2 days     Cordelia Poche, MD [facility] 08/16/2017, 9:42 AM Pager: [phone]  If 7PM-7AM, please contact night-coverage [email or web address] Password [email or web address] 08/16/2017, 9:42 AM

## 2017-08-16 NOTE — Evaluation (Signed)
Occupational Therapy Evaluation Patient Details Name: Jennifer Warner. [name] MRN: [id number] DOB: [birth year] Today's Date: 08/16/2017    History of Present Illness Pt is an 83 y/o female admitted secondary to SOB, chest pain and having a syncopal episode. Pt found to have a PE. Heparin was initiated and within therapeutic range during evaluation. PMH including but not limited to CAD s/p CABG in 2009, HTN, DM and CKD.   Clinical Impression   Pt is independent at baseline and the caregiver of her husband. Pt presents with decreased activity tolerance and is some mild weakness requiring supervision for ADL. Began educating pt in energy conservation. Will follow acutely. Do not anticipate pt will need follow up OT upon d/c.    Follow Up Recommendations  No OT follow up    Equipment Recommendations  None recommended by OT    Recommendations for Other Services       Precautions / Restrictions Precautions Precautions: Fall Restrictions Weight Bearing Restrictions: No      Mobility Bed Mobility Overal bed mobility: Modified Independent             General bed mobility comments: pt standing at bedside upon arrival  Transfers Overall transfer level: Modified independent Equipment used: None                  Balance Overall balance assessment: Needs assistance Sitting-balance support: Feet supported Sitting balance-Leahy Scale: Good     Standing balance support: During functional activity;No upper extremity supported Standing balance-Leahy Scale: Fair                             ADL either performed or assessed with clinical judgement   ADL Overall ADL's : Needs assistance/impaired Eating/Feeding: Independent;Sitting   Grooming: Supervision/safety;Sitting;Wash/dry face   Upper Body Bathing: Set up;Sitting   Lower Body Bathing: Sit to/from stand;Set up   Upper Body Dressing : Set up;Sitting   Lower Body Dressing: Set up;Sit to/from stand    Toilet Transfer: Supervision/safety;Ambulation;Regular Toilet   Toileting- Clothing Manipulation and Hygiene: Modified independent;Sit to/from stand       Functional mobility during ADLs: Supervision/safety General ADL Comments: Pt educated in energy conservation strategies.      Vision Patient Visual Report: No change from baseline       Perception     Praxis      Pertinent Vitals/Pain Pain Assessment: No/denies pain     Hand Dominance Right   Extremity/Trunk Assessment Upper Extremity Assessment Upper Extremity Assessment: Overall WFL for tasks assessed   Lower Extremity Assessment Lower Extremity Assessment: Defer to PT evaluation       Communication Communication Communication: No difficulties   Cognition Arousal/Alertness: Awake/alert Behavior During Therapy: WFL for tasks assessed/performed Overall Cognitive Status: Within Functional Limits for tasks assessed                                     General Comments       Exercises     Shoulder Instructions      Home Living Family/patient expects to be discharged to:: Private residence Living Arrangements: Spouse/significant other Available Help at Discharge: Family;Available 24 hours/day Type of Home: House Home Access: Ramped entrance     Home Layout: One level     Bathroom Shower/Tub: Occupational psychologist: Handicapped height     Home Equipment: Environmental consultant - 2  wheels;Cane - single point;Bedside commode;Shower seat          Prior Functioning/Environment Level of Independence: Independent        Comments: drives, helps husband with ADLs        OT Problem List: Impaired balance (sitting and/or standing);Decreased activity tolerance      OT Treatment/Interventions: Self-care/ADL training;DME and/or AE instruction;Energy conservation;Patient/family education;Balance training    OT Goals(Current goals can be found in the care plan section) Acute Rehab OT  Goals Patient Stated Goal: return home tomorrow OT Goal Formulation: With patient Time For Goal Achievement: 08/23/17 Potential to Achieve Goals: Good ADL Goals Pt Will Perform Grooming: Independently;standing Pt Will Transfer to Toilet: Independently;ambulating;regular height toilet Pt Will Perform Tub/Shower Transfer: Shower transfer;with modified independence;ambulating Additional ADL Goal #1: Pt will generalize energy conservation strategies in ADL as instructed.  OT Frequency: Min 2X/week   Barriers to D/C:            Co-evaluation              AM-PAC PT "6 Clicks" Daily Activity     Outcome Measure Help from another person eating meals?: None Help from another person taking care of personal grooming?: A Little Help from another person toileting, which includes using toliet, bedpan, or urinal?: None Help from another person bathing (including washing, rinsing, drying)?: A Little Help from another person to put on and taking off regular upper body clothing?: None Help from another person to put on and taking off regular lower body clothing?: None 6 Click Score: 22   End of Session    Activity Tolerance: Patient tolerated treatment well Patient left: in bed;with call bell/phone within reach  OT Visit Diagnosis: Unsteadiness on feet (R26.81)                Time: 1441-1455 OT Time Calculation (min): 14 min Charges:  OT General Charges $OT Visit: 1 Visit OT Evaluation $OT Eval Low Complexity: 1 Low G-Codes:     Malka So 08/16/2017, 4:11 PM  08/16/2017 Nestor Lewandowsky, OTR/L Pager: [phone]

## 2017-08-16 NOTE — Progress Notes (Signed)
Pt ambulated to the bathroom with one assist on Rm Air. Pt with minimal labored breathing noted however oxygen saturation dropped into the low 80s on room air. Pt assisted back into bed and oxygen re-administered at 2 liters via nasal cannula. O2 sats improved to 95%. Will continue to monitor and use BSC with oxygen extension tubing applied.

## 2017-08-16 NOTE — Evaluation (Signed)
Physical Therapy Evaluation Patient Details Name: Jennifer Warner MRN: [id number] DOB: 1934/08/08 Today's Date: 08/16/2017   History of Present Illness  Pt is an 83 y/o female admitted secondary to SOB, chest pain and having a syncopal episode. Pt found to have a PE. Heparin was initiated and within therapeutic range during evaluation. PMH including but not limited to CAD s/p CABG in 2009, HTN, DM and CKD.  Clinical Impression  Pt presented supine in bed with HOB elevated, awake and willing to participate in therapy session. Prior to admission, pt reported that she was independent with all functional mobility and ADLs. Pt continues to drive as well. Pt reported that she lives with her husband and assists him with his ADLs. Pt ambulated in hallway without an AD and holding onto therapist's arm. Pt reporting that she still feels a little weaker than her baseline. Pt ambulated on RA with SPO2 maintaining in the mid to upper 90's during activity but desating to 88% after activity with slow recovery to low 90's. PT reapplied 2L of O2 via [place] at end of session and notified pt's RN. PT will continue to follow acutely for mobility progression.     Follow Up Recommendations No PT follow up    Equipment Recommendations  None recommended by PT    Recommendations for Other Services       Precautions / Restrictions Precautions Precautions: Fall Restrictions Weight Bearing Restrictions: No      Mobility  Bed Mobility               General bed mobility comments: pt standing at bedside upon arrival  Transfers Overall transfer level: Modified independent Equipment used: None                Ambulation/Gait Ambulation/Gait assistance: Min guard Ambulation Distance (Feet): 100 Feet Assistive device: None;1 person hand held assist Gait Pattern/deviations: Step-through pattern;Decreased stride length Gait velocity: decreased Gait velocity interpretation: Below normal speed for  age/gender General Gait Details: mild instability but no overt LOB or need for physical assistance. pt ambulated 70' without an AD and then 50 more feet holding therapist's arm. pt reporting that she felt weak  Stairs            Wheelchair Mobility    Modified Rankin (Stroke Patients Only)       Balance Overall balance assessment: Needs assistance Sitting-balance support: Feet supported Sitting balance-Leahy Scale: Good     Standing balance support: During functional activity;No upper extremity supported Standing balance-Leahy Scale: Fair                               Pertinent Vitals/Pain Pain Assessment: No/denies pain    Home Living Family/patient expects to be discharged to:: Private residence Living Arrangements: Spouse/significant other Available Help at Discharge: Family;Available 24 hours/day Type of Home: House Home Access: Ramped entrance     Home Layout: One level Home Equipment: Walker - 2 wheels;Cane - single point;Bedside commode      Prior Function Level of Independence: Independent         Comments: drives, helps husband with ADLs     Hand Dominance        Extremity/Trunk Assessment   Upper Extremity Assessment Upper Extremity Assessment: Defer to OT evaluation    Lower Extremity Assessment Lower Extremity Assessment: Overall WFL for tasks assessed       Communication   Communication: No difficulties  Cognition Arousal/Alertness: Awake/alert  Behavior During Therapy: WFL for tasks assessed/performed Overall Cognitive Status: Within Functional Limits for tasks assessed                                        General Comments      Exercises     Assessment/Plan    PT Assessment Patient needs continued PT services  PT Problem List Decreased strength;Decreased balance;Decreased mobility;Decreased coordination       PT Treatment Interventions Gait training;DME instruction;Stair  training;Functional mobility training;Therapeutic activities;Therapeutic exercise;Balance training;Neuromuscular re-education;Patient/family education    PT Goals (Current goals can be found in the Care Plan section)  Acute Rehab PT Goals Patient Stated Goal: return home tomorrow PT Goal Formulation: With patient Time For Goal Achievement: 08/30/17 Potential to Achieve Goals: Good    Frequency Min 3X/week   Barriers to discharge        Co-evaluation               AM-PAC PT "6 Clicks" Daily Activity  Outcome Measure Difficulty turning over in bed (including adjusting bedclothes, sheets and blankets)?: A Little Difficulty moving from lying on back to sitting on the side of the bed? : A Little Difficulty sitting down on and standing up from a chair with arms (e.g., wheelchair, bedside commode, etc,.)?: None Help needed moving to and from a bed to chair (including a wheelchair)?: None Help needed walking in hospital room?: A Little Help needed climbing 3-5 steps with a railing? : A Little 6 Click Score: 20    End of Session   Activity Tolerance: Patient tolerated treatment well Patient left: in bed;with call bell/phone within reach;Other (comment) (sitting EOB with RN in room) Nurse Communication: Mobility status PT Visit Diagnosis: Other abnormalities of gait and mobility (R26.89)    Time: 2025-4270 PT Time Calculation (min) (ACUTE ONLY): 18 min   Charges:   PT Evaluation $PT Eval Moderate Complexity: 1 Mod     PT G Codes:        [place], PT, DPT [place] 08/16/2017, 2:43 PM

## 2017-08-16 NOTE — Progress Notes (Signed)
Progress Note  Patient Name: Jennifer Warner Date of Encounter: 08/16/2017  Primary Cardiologist: [place]  Subjective   Doing well, no chest pain. No significant shortness of breath.   Inpatient Medications    Scheduled Meds: . aspirin EC  81 mg Oral Daily  . atorvastatin  20 mg Oral q1800  . fenofibrate  160 mg Oral Daily  . insulin aspart  0-9 Units Subcutaneous TID WC  . insulin glargine  15 Units Subcutaneous QHS  . mouth rinse  15 mL Mouth Rinse BID  . metoprolol tartrate  25 mg Oral BID  . pantoprazole  40 mg Oral Daily   Continuous Infusions: . sodium chloride 75 mL/hr at 08/15/17 2358  . heparin 1,000 Units/hr (08/15/17 1927)  . magnesium sulfate 1 - 4 g bolus IVPB 2 g (08/16/17 0859)   PRN Meds: hydrALAZINE, morphine injection, nitroGLYCERIN, ondansetron **OR** ondansetron (ZOFRAN) IV, oxyCODONE-acetaminophen, zolpidem   Vital Signs    Vitals:   08/15/17 2211 08/15/17 2214 08/15/17 2344 08/16/17 0525  BP:   132/62 119/65  Pulse: (!) 101 97 81 83  Resp: 19 20 (!) 24 (!) 21  Temp:    97.8 F (36.6 C)  TempSrc:    Oral  SpO2: (!) 80% 94% 99% 94%  Weight:    139 lb (63 kg)  Height:        Intake/Output Summary (Last 24 hours) at 08/16/17 0958 Last data filed at 08/16/17 0557  Gross per 24 hour  Intake          2334.29 ml  Output              500 ml  Net          1834.29 ml   Filed Weights   08/14/17 1532 08/15/17 1131 08/16/17 0525  Weight: 139 lb (63 kg) 139 lb 1.6 oz (63.1 kg) 139 lb (63 kg)    Telemetry    SR- Personally Reviewed  Physical Exam   General: Well developed, well nourished,older W female appearing in no acute distress. Head: Normocephalic, atraumatic.  Neck: Supple without bruits, JVD. Lungs:  Resp regular and unlabored, CTA. Heart: RRR, S1, S2, no S3, S4, soft systolic murmur; no rub. Abdomen: Soft, non-tender, non-distended with normoactive bowel sounds. No hepatomegaly. No rebound/guarding. No obvious abdominal  masses. Extremities: No clubbing, cyanosis, edema. Distal pedal pulses are 2+ bilaterally. Neuro: Alert and oriented X 3. Moves all extremities spontaneously. Psych: Normal affect.  Labs    Chemistry Recent Labs Lab 08/14/17 1331 08/15/17 0401 08/16/17 0331  NA 140 139 140  K 4.3 3.8 3.9  CL 109 108 109  CO2 24 23 25   GLUCOSE 125* 82 141*  BUN 29* 23* 18  CREATININE 1.13* 1.05* 0.95  CALCIUM 10.1 9.4 9.4  PROT  --   --  5.7*  ALBUMIN  --   --  3.0*  AST  --   --  42*  ALT  --   --  24  ALKPHOS  --   --  38  BILITOT  --   --  0.6  GFRNONAA 44* 48* 55*  GFRAA 51* 56* >60  ANIONGAP 7 8 6      Hematology Recent Labs Lab 08/14/17 1331 08/15/17 0401 08/16/17 0331  WBC 6.9 3.9* 3.6*  RBC 4.31 3.90 3.77*  HGB 13.3 12.0 11.6*  HCT 40.7 36.8 35.7*  MCV 94.4 94.4 94.7  MCH 30.9 30.8 30.8  MCHC 32.7 32.6 32.5  RDW 13.3 13.1  12.9  PLT 231 185 179    Cardiac Enzymes Recent Labs Lab 08/15/17 0401 08/15/17 1209  TROPONINI 0.16* 0.13*    Recent Labs Lab 08/14/17 1408  TROPIPOC 0.18*     BNP Recent Labs Lab 08/15/17 0400  BNP 320.5*     DDimer No results for input(s): DDIMER in the last 168 hours.    Radiology    Dg Chest 2 View  Result Date: 08/14/2017 CLINICAL DATA:  Chest pain.  Heavy weight on chest. EXAM: CHEST  2 VIEW COMPARISON:  05/02/2016. FINDINGS: Normal cardiomediastinal silhouette. Prior CABG. No consolidation or edema. No effusion or pneumothorax. Bones unremarkable. Compared with priors, resolution of mid lung zone nodular density. Carotid atherosclerosis. IMPRESSION: No active cardiopulmonary disease. Electronically Signed   By: Staci Righter M.D.   On: 08/14/2017 14:39   Dg Ankle Complete Right  Result Date: 08/14/2017 CLINICAL DATA:  Right ankle injury due to a fall which occurred during episode of syncope 08/13/2017. Initial encounter. EXAM: RIGHT ANKLE - COMPLETE 3+ VIEW COMPARISON:  None. FINDINGS: There is some soft tissue swelling  about the lateral aspect of the ankle but no underlying acute bony or joint abnormality is identified. Atherosclerotic calcifications are noted. IMPRESSION: Lateral soft tissue swelling without underlying acute bony abnormality. Electronically Signed   By: Inge Rise M.D.   On: 08/14/2017 14:39   Ct Head Wo Contrast  Result Date: 08/15/2017 CLINICAL DATA:  Syncope with head injury. On IV heparin for diagnosis of pulmonary embolus 2 hours prior. EXAM: CT HEAD WITHOUT CONTRAST TECHNIQUE: Contiguous axial images were obtained from the base of the skull through the vertex without intravenous contrast. COMPARISON:  None. FINDINGS: Brain: Intravascular contrast from recent chest CTA could partially obscure subtle hemorrhage. Allowing for this, no evidence of intracranial hemorrhage. Moderate generalized atrophy and chronic small vessel ischemia. No evidence of acute or territorial ischemia. No hydrocephalus. The basilar cisterns are patent. No evidence of territorial infarct or acute ischemia. No extra-axial or intracranial fluid collection. Vascular: Intravascular contrast present from prior chest CTA. Allowing for this, no hyperdense vessel. Atherosclerotic calcifications noted of skullbase vasculature. Skull: No fracture or focal lesion. Sinuses/Orbits: Paranasal sinuses and mastoid air cells are clear. The visualized orbits are unremarkable. Bilateral cataract section. Other: None. IMPRESSION: 1.  No acute intracranial abnormality. 2. Generalized atrophy and chronic small vessel ischemia. Electronically Signed   By: Jeb Levering M.D.   On: 08/15/2017 00:16   Ct Angio Chest Pe W And/or Wo Contrast  Result Date: 08/14/2017 CLINICAL DATA:  Dyspnea and fatigue x2 weeks. EXAM: CT ANGIOGRAPHY CHEST WITH CONTRAST TECHNIQUE: Multidetector CT imaging of the chest was performed using the standard protocol during bolus administration of intravenous contrast. Multiplanar CT image reconstructions and MIPs were  obtained to evaluate the vascular anatomy. CONTRAST:  80 cc Isovue 370 IV COMPARISON:  05/02/2016 CT and CXR 08/14/2017 FINDINGS: Cardiovascular: Acute pulmonary emboli noted within the distal right and left main pulmonary arteries extending into both lower lobes and right upper lobe. Evidence of right heart strain with RV/ LV ratio 1.11. Moderate aortic atherosclerosis without aneurysm or dissection. Heart is top-normal in size with coronary arteriosclerosis. Mediastinum/Nodes: No enlarged mediastinal, hilar, or axillary lymph nodes. Thyroid gland, trachea, and esophagus demonstrate no significant findings. Lungs/Pleura: Faint ground-glass infiltrate in the right upper lobe laterally. Stable calcified granuloma in the right upper lobe. Atelectasis and/or scarring in the superior right lower lobe. No effusion or pneumothorax. Upper Abdomen: No acute abnormality.  Cholecystectomy. Musculoskeletal: Status  post CABG.  Thoracic spondylosis. Review of the MIP images confirms the above findings. IMPRESSION: 1. Positive for acute PE with CT evidence of right heart strain (RV/LV Ratio = 1.11) consistent with at least submassive (intermediate risk) PE. The presence of right heart strain has been associated with an increased risk of morbidity and mortality. Please activate Code PE by paging [phone]. Critical Value/emergent results were called by telephone at the time of interpretation on 08/14/2017 at 9:25 pm to Dr. Virgel Manifold , who verbally acknowledged these results. 2. Faint ground-glass infiltrate in the right upper lobe, question focus of pneumonia or possibly a pulmonary infarct. 3. Right upper lobe granuloma. Aortic Atherosclerosis (ICD10-I70.0). Electronically Signed   By: Ashley Royalty M.D.   On: 08/14/2017 21:25    Cardiac Studies   TTE: 08/15/17  Study Conclusions  - Left ventricle: The cavity size was normal. Wall thickness was   normal. Systolic function was normal. The estimated ejection    fraction was in the range of 55% to 60%. Wall motion was normal;   there were no regional wall motion abnormalities. Doppler   parameters are consistent with elevated mean left atrial filling   pressure. - Right ventricle: The cavity size was moderately dilated. Systolic   function was mildly reduced. - Right atrium: The atrium was mildly dilated. - Tricuspid valve: There was moderate regurgitation. - Pulmonary arteries: Systolic pressure was severely increased. PA   peak pressure: 77 mm Hg (S).  Patient Profile     83 y.o. female with PMH of CAD s/p CABG ('09), IDDM, HTN, HL who presented with chest pain, and syncope. Found to have a PE.   Assessment & Plan    1. Acute PE: submassive with reported RV/LV ratio 1:1 on CTA. Placed on IV heparin. Overall doing well with current therapy. Seen by PCCM, and IR who felt no immediate need for lysis at this time. Echo showed moderately dilated RV, but no significant RV strain. Normal EF.   2. Elevated Troponin: 0.16>>0.13 low flat trend in the setting of acute PE. No ischemic work up planned at this time.   3. Acute respiratory failure with hypoxia: 2/2 to acute PE. Tolerating supplemental O2 via [place]  4. HTN: stable on current therapy  5. HL: on statin    Signed, Reino Bellis, NP  08/16/2017, 9:58 AM  Pager # [phone]   Patient seen, examined. Available data reviewed. Agree with findings, assessment, and plan as outlined by Bing Neighbors, NP.  Alert, oriented woman in no distress.  She is off of O2 and breathing comfortably with an oxygen saturation of 95%.  Lung fields are clear.  Heart is regular rate and rhythm with a grade 2/6 systolic ejection murmur at the right upper sternal border.  There is no peripheral edema.  The patient is in the process of transitioning from IV heparin to a direct oral anticoagulant drug.  She appears clinically stable with improvement in her respiratory status after anticoagulation.  She does have evidence  of RV dilatation/strain by echo as well as pulmonary hypertension.  However, considering her marked clinical improvement mechanical thrombectomy and thrombolysis were not recommended.  She seems to be progressing very well.  We will make sure that she has a follow-up echocardiogram in 3-6 months to reassess RV function and pulmonary hypertension.  No other active cardiac issues at this time.  Agree with plans as outlined above.  Will arrange follow-up with Dr. Martin[place].  Sherren Mocha, M.D. 08/16/2017 11:41 AM  For questions or updates, please contact [place] Please consult [email or web address] for contact info under Cardiology/STEMI. Daytime calls, contact the Day Call APP (6a-8a) or assigned team (Teams A-D) provider (7:30a - 5p). All other daytime calls (7:30-5p), contact the Card Master @ [phone].   Nighttime calls, contact the assigned APP (5p-8p) or MD (6:30p-8p). Overnight calls (8p-6a), contact the on call Fellow @ [phone].

## 2017-08-16 NOTE — Progress Notes (Signed)
Referring Physician(s): Dr. Gilford Raid  Supervising Physician: Aletta Edouard  Patient Status:  [facility] - In-pt  Chief Complaint: Pulmonary embolus  Subjective: Patient sitting on side of bed, breathing comfortably.  Just finished breakfast.  Feeling better today.   Allergies: Levofloxacin  Medications: Prior to Admission medications   Medication Sig Start Date End Date Taking? Authorizing Provider  aspirin EC 81 MG EC tablet Take 1 tablet (81 mg total) by mouth daily. 05/04/16  Yes Arbutus Leas, NP  atorvastatin (LIPITOR) 20 MG tablet Take 20 mg by mouth daily at 6 PM.  02/18/15  Yes [provider]  diphenhydramine-acetaminophen (TYLENOL PM) 25-500 MG TABS Take 2 tablets by mouth at bedtime.   Yes [provider]  fenofibrate (TRICOR) 145 MG tablet TAKE 1 TABLET BY MOUTH EVERY EVENING 05/19/15  Yes [place], Peter M, MD  LANTUS SOLOSTAR 100 UNIT/ML injection Inject 25 Units into the skin at bedtime.  09/22/12  Yes [provider]  metoprolol tartrate (LOPRESSOR) 25 MG tablet Take 1 tablet (25 mg total) by mouth 2 (two) times daily. 04/17/13  Yes [place], Peter M, MD  nitroGLYCERIN (NITROSTAT) 0.4 MG SL tablet Place 1 tablet (0.4 mg total) under the tongue every 5 (five) minutes x 3 doses as needed for chest pain. 05/04/16  Yes Arbutus Leas, NP  omeprazole (PRILOSEC) 20 MG capsule Take 1 capsule (20 mg total) by mouth every evening. 07/14/17  Yes [place], Peter M, MD  Vitamin D, Ergocalciferol, (DRISDOL) 50000 units CAPS capsule Take 50,000 Units by mouth 2 (two) times a week. 04/04/16  Yes [provider]     Vital Signs: BP 119/65 (BP Location: Right Arm)   Pulse 83   Temp 97.8 F (36.6 C) (Oral)   Resp (!) 21   Ht 5\' 3"  (1.6 m)   Wt 139 lb (63 kg)   SpO2 94%   BMI 24.62 kg/m   Physical Exam  Constitutional: She is oriented to person, place, and time. She appears well-developed.  Cardiovascular: Normal rate and regular rhythm.     Pulmonary/Chest: Effort normal and breath sounds normal. No respiratory distress.  Examined at rest. Intermittent cough.    Neurological: She is alert and oriented to person, place, and time.  Skin: Skin is warm and dry.  Nursing note and vitals reviewed.     Imaging: Dg Chest 2 View  Result Date: 08/14/2017 CLINICAL DATA:  Chest pain.  Heavy weight on chest. EXAM: CHEST  2 VIEW COMPARISON:  05/02/2016. FINDINGS: Normal cardiomediastinal silhouette. Prior CABG. No consolidation or edema. No effusion or pneumothorax. Bones unremarkable. Compared with priors, resolution of mid lung zone nodular density. Carotid atherosclerosis. IMPRESSION: No active cardiopulmonary disease. Electronically Signed   By: Staci Righter M.D.   On: 08/14/2017 14:39   Dg Ankle Complete Right  Result Date: 08/14/2017 CLINICAL DATA:  Right ankle injury due to a fall which occurred during episode of syncope 08/13/2017. Initial encounter. EXAM: RIGHT ANKLE - COMPLETE 3+ VIEW COMPARISON:  None. FINDINGS: There is some soft tissue swelling about the lateral aspect of the ankle but no underlying acute bony or joint abnormality is identified. Atherosclerotic calcifications are noted. IMPRESSION: Lateral soft tissue swelling without underlying acute bony abnormality. Electronically Signed   By: Inge Rise M.D.   On: 08/14/2017 14:39   Ct Head Wo Contrast  Result Date: 08/15/2017 CLINICAL DATA:  Syncope with head injury. On IV heparin for diagnosis of pulmonary embolus 2 hours prior. EXAM: CT HEAD  WITHOUT CONTRAST TECHNIQUE: Contiguous axial images were obtained from the base of the skull through the vertex without intravenous contrast. COMPARISON:  None. FINDINGS: Brain: Intravascular contrast from recent chest CTA could partially obscure subtle hemorrhage. Allowing for this, no evidence of intracranial hemorrhage. Moderate generalized atrophy and chronic small vessel ischemia. No evidence of acute or territorial  ischemia. No hydrocephalus. The basilar cisterns are patent. No evidence of territorial infarct or acute ischemia. No extra-axial or intracranial fluid collection. Vascular: Intravascular contrast present from prior chest CTA. Allowing for this, no hyperdense vessel. Atherosclerotic calcifications noted of skullbase vasculature. Skull: No fracture or focal lesion. Sinuses/Orbits: Paranasal sinuses and mastoid air cells are clear. The visualized orbits are unremarkable. Bilateral cataract section. Other: None. IMPRESSION: 1.  No acute intracranial abnormality. 2. Generalized atrophy and chronic small vessel ischemia. Electronically Signed   By: Jeb Levering M.D.   On: 08/15/2017 00:16   Ct Angio Chest Pe W And/or Wo Contrast  Result Date: 08/14/2017 CLINICAL DATA:  Dyspnea and fatigue x2 weeks. EXAM: CT ANGIOGRAPHY CHEST WITH CONTRAST TECHNIQUE: Multidetector CT imaging of the chest was performed using the standard protocol during bolus administration of intravenous contrast. Multiplanar CT image reconstructions and MIPs were obtained to evaluate the vascular anatomy. CONTRAST:  80 cc Isovue 370 IV COMPARISON:  05/02/2016 CT and CXR 08/14/2017 FINDINGS: Cardiovascular: Acute pulmonary emboli noted within the distal right and left main pulmonary arteries extending into both lower lobes and right upper lobe. Evidence of right heart strain with RV/ LV ratio 1.11. Moderate aortic atherosclerosis without aneurysm or dissection. Heart is top-normal in size with coronary arteriosclerosis. Mediastinum/Nodes: No enlarged mediastinal, hilar, or axillary lymph nodes. Thyroid gland, trachea, and esophagus demonstrate no significant findings. Lungs/Pleura: Faint ground-glass infiltrate in the right upper lobe laterally. Stable calcified granuloma in the right upper lobe. Atelectasis and/or scarring in the superior right lower lobe. No effusion or pneumothorax. Upper Abdomen: No acute abnormality.  Cholecystectomy.  Musculoskeletal: Status post CABG.  Thoracic spondylosis. Review of the MIP images confirms the above findings. IMPRESSION: 1. Positive for acute PE with CT evidence of right heart strain (RV/LV Ratio = 1.11) consistent with at least submassive (intermediate risk) PE. The presence of right heart strain has been associated with an increased risk of morbidity and mortality. Please activate Code PE by paging [phone]. Critical Value/emergent results were called by telephone at the time of interpretation on 08/14/2017 at 9:25 pm to Dr. Virgel Manifold , who verbally acknowledged these results. 2. Faint ground-glass infiltrate in the right upper lobe, question focus of pneumonia or possibly a pulmonary infarct. 3. Right upper lobe granuloma. Aortic Atherosclerosis (ICD10-I70.0). Electronically Signed   By: Ashley Royalty M.D.   On: 08/14/2017 21:25    Labs:  CBC:  Recent Labs  08/14/17 1331 08/15/17 0401 08/16/17 0331  WBC 6.9 3.9* 3.6*  HGB 13.3 12.0 11.6*  HCT 40.7 36.8 35.7*  PLT 231 185 179    COAGS:  Recent Labs  08/15/17 0400  INR 1.14  APTT 55*    BMP:  Recent Labs  08/14/17 1331 08/15/17 0401 08/16/17 0331  NA 140 139 140  K 4.3 3.8 3.9  CL 109 108 109  CO2 24 23 25   GLUCOSE 125* 82 141*  BUN 29* 23* 18  CALCIUM 10.1 9.4 9.4  CREATININE 1.13* 1.05* 0.95  GFRNONAA 44* 48* 55*  GFRAA 51* 56* >60    LIVER FUNCTION TESTS:  Recent Labs  08/16/17 0331  BILITOT 0.6  AST  42*  ALT 24  ALKPHOS 38  PROT 5.7*  ALBUMIN 3.0*    Assessment and Plan: Submassive PE IR following for consideration of PE lysis.   Patient was started on IV heparin with good response yesterday.  Intervention deferred to monitor status and continued response to conversative therapy.  Patient states she is feeling better today.  VSS.  Echo showed:  -Left ventricle: The cavity size was normal. Wall thickness was   normal. Systolic function was normal. The estimated ejection   fraction was  in the range of 55% to 60%. Wall motion was normal;   there were no regional wall motion abnormalities. Doppler   parameters are consistent with elevated mean left atrial filling   pressure. - Right ventricle: The cavity size was moderately dilated. Systolic   function was mildly reduced. - Right atrium: The atrium was mildly dilated. - Tricuspid valve: There was moderate regurgitation. - Pulmonary arteries: Systolic pressure was severely increased. PA   peak pressure: 77 mm Hg (S). Discussed case with Dr. Kathlene Cote.  Continue current management. IR available.  Please re-consult if needed.   Electronically Signed: Docia Barrier, PA 08/16/2017, 10:48 AM   I spent a total of 15 Minutes at the the patient's bedside AND on the patient's hospital floor or unit, greater than 50% of which was counseling/coordinating care for pulmonary embolus.

## 2017-08-16 NOTE — Progress Notes (Signed)
Name: Jennifer Warner. [name] MRN: [id number] DOB: Jun 23, 1934    ADMISSION DATE:  08/14/2017 CONSULTATION DATE:  08/14/2017  REFERRING MD :  Dr. Blaine Hamper  CHIEF COMPLAINT:  SOB/ sycope  HISTORY OF PRESENT ILLNESS:   83 year old female with PMH of never smoker, DMT2, CAD s/p prior CABG, HTN, HLD, CKD stage 3, and GERD admitted 10/29 by [facility] to SDU with submassive PE.  Reports onset of exertional dyspnea with chest heaviness 2 weeks ago with near syncopal episodes.  Only able to walk a few feet.  Yesterday was taking her dogs out and reports unwitnessed syncopal episode on her porch.  Denies hitting her head but experienced right ankle pain after fall. Additionally, she reports episodes of heart palpitations/racing and generalized weakness x 2 weeks, nonproductive cough, and chills last night.  Denies fever, headache, new recent medications, significant weight loss, leg swelling, or recent traveling.  Takes ASA only.  Denies prior DVT/ PE.   Labs noted for troponin 0.18, WBC 6.9.  Patient has been afebrile, hemodynamically stable and requiring 2L [place] for room sats between 88-92%.  EKG SR with ST/[place] abnormalities, possible anterior ischemia.  Cardiology consulted.  CTA chest showing acute submassive PE with RV/LV strain 1.11.  She was placed on IV heparin gtt and PCCM consulted for further pulmonary recommendations.    SUBJECTIVE:  Clinically improved Tolerating room air  VITAL SIGNS: Temp:  [97.7 F (36.5 C)-98 F (36.7 C)] 97.8 F (36.6 C) (10/31 0525) Pulse Rate:  [73-101] 83 (10/31 0525) Resp:  [18-26] 21 (10/31 0525) BP: (119-153)/(62-84) 119/65 (10/31 0525) SpO2:  [80 %-99 %] 94 % (10/31 0525) Weight:  [63 kg (139 lb)-63.1 kg (139 lb 1.6 oz)] 63 kg (139 lb) (10/31 0525)  PHYSICAL EXAMINATION: General: Pleasant woman, no distress, sitting up at bedside HEENT: Oropharynx clear, no stridor Neuro: Alert, oriented, moves all extremities, no focal abnormalities CV: Borderline tachycardia,  regular, no murmur PULM: Clear bilaterally GI: Soft, positive bowel sounds Extremities: No edema, no cords, no calf tenderness Skin: No rash   Recent Labs Lab 08/14/17 1331 08/15/17 0401 08/16/17 0331  NA 140 139 140  K 4.3 3.8 3.9  CL 109 108 109  CO2 24 23 25   BUN 29* 23* 18  CREATININE 1.13* 1.05* 0.95  GLUCOSE 125* 82 141*    Recent Labs Lab 08/14/17 1331 08/15/17 0401 08/16/17 0331  HGB 13.3 12.0 11.6*  HCT 40.7 36.8 35.7*  WBC 6.9 3.9* 3.6*  PLT 231 185 179   Dg Chest 2 View  Result Date: 08/14/2017 CLINICAL DATA:  Chest pain.  Heavy weight on chest. EXAM: CHEST  2 VIEW COMPARISON:  05/02/2016. FINDINGS: Normal cardiomediastinal silhouette. Prior CABG. No consolidation or edema. No effusion or pneumothorax. Bones unremarkable. Compared with priors, resolution of mid lung zone nodular density. Carotid atherosclerosis. IMPRESSION: No active cardiopulmonary disease. Electronically Signed   By: Staci Righter M.D.   On: 08/14/2017 14:39   Dg Ankle Complete Right  Result Date: 08/14/2017 CLINICAL DATA:  Right ankle injury due to a fall which occurred during episode of syncope 08/13/2017. Initial encounter. EXAM: RIGHT ANKLE - COMPLETE 3+ VIEW COMPARISON:  None. FINDINGS: There is some soft tissue swelling about the lateral aspect of the ankle but no underlying acute bony or joint abnormality is identified. Atherosclerotic calcifications are noted. IMPRESSION: Lateral soft tissue swelling without underlying acute bony abnormality. Electronically Signed   By: Inge Rise M.D.   On: 08/14/2017 14:39   Ct Head Wo  Contrast  Result Date: 08/15/2017 CLINICAL DATA:  Syncope with head injury. On IV heparin for diagnosis of pulmonary embolus 2 hours prior. EXAM: CT HEAD WITHOUT CONTRAST TECHNIQUE: Contiguous axial images were obtained from the base of the skull through the vertex without intravenous contrast. COMPARISON:  None. FINDINGS: Brain: Intravascular contrast from  recent chest CTA could partially obscure subtle hemorrhage. Allowing for this, no evidence of intracranial hemorrhage. Moderate generalized atrophy and chronic small vessel ischemia. No evidence of acute or territorial ischemia. No hydrocephalus. The basilar cisterns are patent. No evidence of territorial infarct or acute ischemia. No extra-axial or intracranial fluid collection. Vascular: Intravascular contrast present from prior chest CTA. Allowing for this, no hyperdense vessel. Atherosclerotic calcifications noted of skullbase vasculature. Skull: No fracture or focal lesion. Sinuses/Orbits: Paranasal sinuses and mastoid air cells are clear. The visualized orbits are unremarkable. Bilateral cataract section. Other: None. IMPRESSION: 1.  No acute intracranial abnormality. 2. Generalized atrophy and chronic small vessel ischemia. Electronically Signed   By: Jeb Levering M.D.   On: 08/15/2017 00:16   Ct Angio Chest Pe W And/or Wo Contrast  Result Date: 08/14/2017 CLINICAL DATA:  Dyspnea and fatigue x2 weeks. EXAM: CT ANGIOGRAPHY CHEST WITH CONTRAST TECHNIQUE: Multidetector CT imaging of the chest was performed using the standard protocol during bolus administration of intravenous contrast. Multiplanar CT image reconstructions and MIPs were obtained to evaluate the vascular anatomy. CONTRAST:  80 cc Isovue 370 IV COMPARISON:  05/02/2016 CT and CXR 08/14/2017 FINDINGS: Cardiovascular: Acute pulmonary emboli noted within the distal right and left main pulmonary arteries extending into both lower lobes and right upper lobe. Evidence of right heart strain with RV/ LV ratio 1.11. Moderate aortic atherosclerosis without aneurysm or dissection. Heart is top-normal in size with coronary arteriosclerosis. Mediastinum/Nodes: No enlarged mediastinal, hilar, or axillary lymph nodes. Thyroid gland, trachea, and esophagus demonstrate no significant findings. Lungs/Pleura: Faint ground-glass infiltrate in the right upper  lobe laterally. Stable calcified granuloma in the right upper lobe. Atelectasis and/or scarring in the superior right lower lobe. No effusion or pneumothorax. Upper Abdomen: No acute abnormality.  Cholecystectomy. Musculoskeletal: Status post CABG.  Thoracic spondylosis. Review of the MIP images confirms the above findings. IMPRESSION: 1. Positive for acute PE with CT evidence of right heart strain (RV/LV Ratio = 1.11) consistent with at least submassive (intermediate risk) PE. The presence of right heart strain has been associated with an increased risk of morbidity and mortality. Please activate Code PE by paging [phone]. Critical Value/emergent results were called by telephone at the time of interpretation on 08/14/2017 at 9:25 pm to Dr. Virgel Manifold , who verbally acknowledged these results. 2. Faint ground-glass infiltrate in the right upper lobe, question focus of pneumonia or possibly a pulmonary infarct. 3. Right upper lobe granuloma. Aortic Atherosclerosis (ICD10-I70.0). Electronically Signed   By: Ashley Royalty M.D.   On: 08/14/2017 21:25   SIGNIFICANT EVENTS  10/29 > Admit  STUDIES:  CTA Chest 10/29 >> 1. Positive for acute PE with CT evidence of right heart strain (RV/LV Ratio = 1.11) consistent with at least submassive (intermediate risk) PE. The presence of right heart strain has been associated with an increased risk of morbidity and mortality. Please activate Code PE by paging [phone]. Critical Value/emergent results were called by telephone at the time of interpretation on 08/14/2017 at 9:25 pm to Dr. Virgel Manifold , who verbally acknowledged these results. 2. Faint ground-glass infiltrate in the right upper lobe, question focus of pneumonia or possibly a pulmonary  infarct. 3. Right upper lobe granuloma. 4.  Aortic Atherosclerosis  Lower extremity Doppler study >> no lower extremity DVT  ASSESSMENT / PLAN:  Acute PE-submassive pulmonary emboli, appear to be  unprovoked based on history.  Targeted lysis deferred given her clinical stability.  RV to LV ratio estimated at 1.0 by interventional radiology. -Agree with transition heparin to oral anticoagulation, likely DOAC -Duration of therapy 3-6 months in absence of any provoking event -She will need a repeat echocardiogram in approximately 6 months to ensure improvement in her right ventricular function, pulmonary pressures -Hypercoagulable state workup after she completes her anticoagulation  Please call if we can assist further.    Baltazar Apo, MD, PhD 08/16/2017, 11:20 AM [facility] [phone] or if no answer [phone]

## 2017-08-16 NOTE — Care Management Note (Signed)
Case Management Note  Patient Details  Name: Jennifer Warner MRN: [id number] Date of Birth: 01-Dec-1933  Subjective/Objective:  Pt presented for SOB/ Syncope- positive for PE. Initiated on IV Heparin gtt. PTA from home with husband and pt is independent. Benefits Check completed for Xarelto vs Eliquis. Pt is in the Coverage Gap and she will not be able to afford either medication. CM did discuss possibility of Coumadin. CM did make Pharmacy aware.  CM will continue to monitor for additional needs.                Action/Plan: JOAN @ [facility] AO # [id number]   1. XARELTO 15 ,G BID  COVER- YES  CO-PAY- $ 308.19  Q/L TWO PILL PER DAY  TIER- 3 DRUG  PRIOR APPROVAL- NO   2. XARELTO 20 MG DAILY  COVER- YES  CO-PAY- $ 154.45 Q/L ONE PILL PER DAY  TIER- 3 DRUG  PRIOR APPROVAL- NO   3. ELIQUIS 2.5 MG BID  COVER- YES  CO-PAY- $ 154.44  Q/L TWO PILL PER DAY  TIER- 3 DRUG  PRIOR APPROVAL- NO   4. ELIQUIS 50 MG BID  COVER- YES  CO-PAY- $ 154.44  Q/L TWO PILL PER DAY  TIER- 3 DRUG  PRIOR APPROVAL- NO   5 ELIQUIS 10 MG : NOT ON PATIENT FORMULARY    DEDUCTIBLE : MET  PATIENT IN COVER GAP   PREFERRED PHARMACY : [facility]  Expected Discharge Date:  08/16/17               Expected Discharge Plan:  Home/Self Care  In-House Referral:  NA  Discharge planning Services  CM Consult  Post Acute Care Choice:  NA Choice offered to:  NA  DME Arranged:  N/A DME Agency:  NA  HH Arranged:  NA HH Agency:  NA  Status of Service:  Completed, signed off  If discussed at Long Length of Stay Meetings, dates discussed:    Additional Comments:  Bethena Roys, RN 08/16/2017, 3:02 PM

## 2017-08-16 NOTE — Progress Notes (Addendum)
[place] for heparin Indication: PE  Allergies  Allergen Reactions  . Levofloxacin Nausea And Vomiting    Patient Measurements: Height: 5\' 3"  (160 cm) Weight: 139 lb (63 kg) IBW/kg (Calculated) : 52.4 Heparin Dosing Weight: 63 kg  Vital Signs: Temp: 97.8 F (36.6 C) (10/31 0525) Temp Source: Oral (10/31 0525) BP: 119/65 (10/31 0525) Pulse Rate: 66 (10/31 1148)  Labs:  Recent Labs  08/14/17 1331 08/15/17 0400  08/15/17 0401 08/15/17 1209 08/15/17 2009 08/16/17 0331 08/16/17 1122  HGB 13.3  --   --  12.0  --   --  11.6*  --   HCT 40.7  --   --  36.8  --   --  35.7*  --   PLT 231  --   --  185  --   --  179  --   APTT  --  55*  --   --   --   --   --   --   LABPROT  --  14.5  --   --   --   --   --   --   INR  --  1.14  --   --   --   --   --   --   HEPARINUNFRC  --   --   < > 0.12* 0.53 0.49  --  0.46  CREATININE 1.13*  --   --  1.05*  --   --  0.95  --   TROPONINI  --   --   --  0.16* 0.13*  --   --   --   < > = values in this interval not displayed.  Estimated Creatinine Clearance: 41.6 mL/min (by C-G formula based on SCr of 0.95 mg/dL).  Assessment: 83 y.o. female on heparin drip for CT finding of acute submassive PE w/ R heart strain (unprovoked). No anticoagulation prior to admission.   Confirmatory heparin level yesterday was therapeutic. Repeat level today with daily labs came back within goal range at 0.46, on 1000 units/hr. No interruptions with infusion. No bleeding noted.  Cost for NOAC agents was too expensive for patient. Plan to start warfarin instead. INR at baseline is 1.14. CBC is stable. On concurrent fenofibrate, may impact warfarin sensitivity.   Goal of Therapy:  Heparin level 0.3-0.7 units/ml Monitor platelets by anticoagulation protocol: Yes   Plan:  Continue heparin infusion at 1000 units/hr Order warfarin 5 mg tonight  Monitor daily heparin level, CBC, INR, and s/sx of bleeding Follow up on plan  if can consider enoxaparin for outpatient bridge  Doylene Canard, PharmD Clinical Pharmacist  Pager: [phone] Clinical Phone for 08/16/2017 until 3:30pm: [phone] If after 3:30pm, please call main pharmacy at [phone]

## 2017-08-16 NOTE — Discharge Summary (Addendum)
Physician Discharge Summary  Muskaan Smet. [name] WER:[id number] DOB: 1933/11/11 DOA: 08/14/2017  PCP: Foye Spurling, MD  Admit date: 08/14/2017 Discharge date: 08/17/2017  Admitted From: Home Disposition: Home  Recommendations for Outpatient Follow-up:  1. Follow up with PCP in 1 week 2. Anticoagulation therapy: 6 month duration 3. Recommend hypercoag workup after anticoagulation therapy 4. Recommend repeat echocardiogram in 3-6 months 5. Please obtain BMP/CBC in one week 6. Please follow up on the following pending results: None  Home Health: None Equipment/Devices: None  Discharge Condition: Stable CODE STATUS: Full code Diet recommendation: Heart healthy   Brief/Interim Summary:  Admission HPI written by Ivor Costa, MD   Chief Complaint: Shortness of breath, chest pain, syncope  HPI: Jennifer Warner. [name] is a 83 y.o. female with medical history significant of CAD, s/p of CABG, hypertension, hyperlipidemia, diabetes mellitus, GERD, polycystic ovaries, CKD-3, who presents with shortness breath, chest pain or syncope.  Pt states that she has been having shortness of breath and chest pain in the past 2 weeks. Her chest pain is located in the frontal chest, intermittent, mild, aggravated by exertion or movement. Pt has dry cough, no fever or chills. She denies recent traveling. She states that she has intermittent bilateral calf cramping pain recently. She states that she has lightheadedness, and and had syncope episode and fell yesterday. She states that she injured her head and also has pain in right ankle. No neck pain. No leg edema. Patient denies nausea, vomiting, diarrhea, abdominal pain. No symptoms of UTI or unilateral weakness.  ED Course: pt was found to have  troponin 0.18, WBC 6.9, stable renal function, tachycardia, tachypnea, oxygen saturation to 88% on room air. Chest x-ray negative. X-ray of the right ankle is negative for bony fracture. Dr. Burt Knack of Cardiology was  consulted--> recommended to get CTA to r/o PE, which was done and showed submassive PE with CT evidence of right heart strain. Patient is admitted to stepdown as inpatient. IV heparin is started. PCCM and will be consulted by EDP.    Hospital course:  Pulmonary embolism Appears unprovoked.  Lower extremity venous Dopplers negative for acute DVT.  Currently on heparin drip.  Evidence of right heart strain on CT scan with evidence of mildly reduced right ventricle on echocardiogram.  Interventional radiology consulted for possible thrombectomy/thrombolysis but recommend risks outweighed benefits as patient improved significantly. Patient transitioned to Coumadin secondary to affordability under her insurance and heparin transitioned to Lovenox. She was discharged with both prescriptions and will follow-up at her cardiologists office on Monday, 11/5 for INR check.  Syncope and collapse Secondary to PE. CT head negative for acute process. Orthostatic vitals negative.  Acute respiratory failure with hypoxia Secondary to PE. Continued oxygen therapy as needed  Elevated troponin Likely secondary to pulmonary embolism.  Patient with a history of CAD with CABG 9 years ago.  No chest pain.  Troponin flat.  CAD status post CABG History of CABG in 2009. Continue metoprolol 25 mg p.o.  CKD stage III Baseline creatinine of 1.1-1.2.  Stable.  Essential hypertension Normotensive and sometimes slightly hypertensive. Continued metoprolol 25 mg p.o. twice daily. Hydralazine 5mg  as needed while inpatient.  Hyperlipidemia Stable. Continued atorvastatin 20 mg p.o. daily and fenofibrate 160 mg p.o. Daily.  Diabetes mellitus type 2, insulin-dependent Last hemoglobin A1c of 6.1%. Lantus 15 units while inpatient. Continue home dose on discharge. Sliding scale while inpatient.  GERD Asymptomatic. Continued Protonix 40 mg p.o. Daily   Discharge Diagnoses:  Principal Problem:  PE (pulmonary  thromboembolism) (HCC) Active Problems:   CAD (coronary artery disease)   S/P CABG x 2   Hyperlipidemia   Essential hypertension   CKD (chronic kidney disease), stage III (HCC)   Syncope   Elevated troponin   Type II diabetes mellitus with renal manifestations [facility])    Discharge Instructions  Discharge Instructions    Call MD for:  difficulty breathing, headache or visual disturbances    Complete by:  As directed    Call MD for:  severe uncontrolled pain    Complete by:  As directed      Allergies as of 08/17/2017      Reactions   Levofloxacin Nausea And Vomiting      Medication List    TAKE these medications   aspirin 81 MG EC tablet Take 1 tablet (81 mg total) by mouth daily.   atorvastatin 20 MG tablet Commonly known as:  LIPITOR Take 20 mg by mouth daily at 6 PM.   diphenhydramine-acetaminophen 25-500 MG Tabs tablet Commonly known as:  TYLENOL PM Take 2 tablets by mouth at bedtime.   enoxaparin 100 MG/ML injection Commonly known as:  LOVENOX Inject 0.95 mLs (95 mg total) into the skin daily.   fenofibrate 145 MG tablet Commonly known as:  TRICOR TAKE 1 TABLET BY MOUTH EVERY EVENING   LANTUS SOLOSTAR 100 UNIT/ML Solostar Pen Generic drug:  Insulin Glargine Inject 25 Units into the skin at bedtime.   metoprolol tartrate 25 MG tablet Commonly known as:  LOPRESSOR Take 1 tablet (25 mg total) by mouth 2 (two) times daily.   nitroGLYCERIN 0.4 MG SL tablet Commonly known as:  NITROSTAT Place 1 tablet (0.4 mg total) under the tongue every 5 (five) minutes x 3 doses as needed for chest pain.   omeprazole 20 MG capsule Commonly known as:  PRILOSEC Take 1 capsule (20 mg total) by mouth every evening.   Vitamin D (Ergocalciferol) 50000 units Caps capsule Commonly known as:  DRISDOL Take 50,000 Units by mouth 2 (two) times a week.   warfarin 3 MG tablet Commonly known as:  COUMADIN Take 1 tablet (3 mg total) by mouth daily at 6 PM.      Follow-up  Information    Martinique, Peter M, MD. Go on 08/21/2017.   Specialty:  Cardiology Why:  3:00PM for Coumadin Clinic. Make an appointment to see Dr. Martinique in 3-6 months for repeat echocardiogram Contact information: [address] [place] [address] [phone]        Foye Spurling, MD. Schedule an appointment as soon as possible for a visit in 1 week(s).   Specialty:  Internal Medicine Contact information: [address] [place] [address] [phone]          Allergies  Allergen Reactions  . Levofloxacin Nausea And Vomiting    Consultations:  Pulmonology  Interventional radiology  Cardiology   Procedures/Studies: Dg Chest 2 View  Result Date: 08/14/2017 CLINICAL DATA:  Chest pain.  Heavy weight on chest. EXAM: CHEST  2 VIEW COMPARISON:  05/02/2016. FINDINGS: Normal cardiomediastinal silhouette. Prior CABG. No consolidation or edema. No effusion or pneumothorax. Bones unremarkable. Compared with priors, resolution of mid lung zone nodular density. Carotid atherosclerosis. IMPRESSION: No active cardiopulmonary disease. Electronically Signed   By: Staci Righter M.D.   On: 08/14/2017 14:39   Dg Ankle Complete Right  Result Date: 08/14/2017 CLINICAL DATA:  Right ankle injury due to a fall which occurred during episode of syncope  08/13/2017. Initial encounter. EXAM: RIGHT ANKLE - COMPLETE 3+ VIEW COMPARISON:  None. FINDINGS: There is some soft tissue swelling about the lateral aspect of the ankle but no underlying acute bony or joint abnormality is identified. Atherosclerotic calcifications are noted. IMPRESSION: Lateral soft tissue swelling without underlying acute bony abnormality. Electronically Signed   By: Inge Rise M.D.   On: 08/14/2017 14:39   Ct Head Wo Contrast  Result Date: 08/15/2017 CLINICAL DATA:  Syncope with head injury. On IV heparin for diagnosis of pulmonary embolus 2 hours prior. EXAM: CT HEAD WITHOUT CONTRAST  TECHNIQUE: Contiguous axial images were obtained from the base of the skull through the vertex without intravenous contrast. COMPARISON:  None. FINDINGS: Brain: Intravascular contrast from recent chest CTA could partially obscure subtle hemorrhage. Allowing for this, no evidence of intracranial hemorrhage. Moderate generalized atrophy and chronic small vessel ischemia. No evidence of acute or territorial ischemia. No hydrocephalus. The basilar cisterns are patent. No evidence of territorial infarct or acute ischemia. No extra-axial or intracranial fluid collection. Vascular: Intravascular contrast present from prior chest CTA. Allowing for this, no hyperdense vessel. Atherosclerotic calcifications noted of skullbase vasculature. Skull: No fracture or focal lesion. Sinuses/Orbits: Paranasal sinuses and mastoid air cells are clear. The visualized orbits are unremarkable. Bilateral cataract section. Other: None. IMPRESSION: 1.  No acute intracranial abnormality. 2. Generalized atrophy and chronic small vessel ischemia. Electronically Signed   By: Jeb Levering M.D.   On: 08/15/2017 00:16   Ct Angio Chest Pe W And/or Wo Contrast  Result Date: 08/14/2017 CLINICAL DATA:  Dyspnea and fatigue x2 weeks. EXAM: CT ANGIOGRAPHY CHEST WITH CONTRAST TECHNIQUE: Multidetector CT imaging of the chest was performed using the standard protocol during bolus administration of intravenous contrast. Multiplanar CT image reconstructions and MIPs were obtained to evaluate the vascular anatomy. CONTRAST:  80 cc Isovue 370 IV COMPARISON:  05/02/2016 CT and CXR 08/14/2017 FINDINGS: Cardiovascular: Acute pulmonary emboli noted within the distal right and left main pulmonary arteries extending into both lower lobes and right upper lobe. Evidence of right heart strain with RV/ LV ratio 1.11. Moderate aortic atherosclerosis without aneurysm or dissection. Heart is top-normal in size with coronary arteriosclerosis. Mediastinum/Nodes: No  enlarged mediastinal, hilar, or axillary lymph nodes. Thyroid gland, trachea, and esophagus demonstrate no significant findings. Lungs/Pleura: Faint ground-glass infiltrate in the right upper lobe laterally. Stable calcified granuloma in the right upper lobe. Atelectasis and/or scarring in the superior right lower lobe. No effusion or pneumothorax. Upper Abdomen: No acute abnormality.  Cholecystectomy. Musculoskeletal: Status post CABG.  Thoracic spondylosis. Review of the MIP images confirms the above findings. IMPRESSION: 1. Positive for acute PE with CT evidence of right heart strain (RV/LV Ratio = 1.11) consistent with at least submassive (intermediate risk) PE. The presence of right heart strain has been associated with an increased risk of morbidity and mortality. Please activate Code PE by paging [phone]. Critical Value/emergent results were called by telephone at the time of interpretation on 08/14/2017 at 9:25 pm to Dr. Virgel Manifold , who verbally acknowledged these results. 2. Faint ground-glass infiltrate in the right upper lobe, question focus of pneumonia or possibly a pulmonary infarct. 3. Right upper lobe granuloma. Aortic Atherosclerosis (ICD10-I70.0). Electronically Signed   By: Ashley Royalty M.D.   On: 08/14/2017 21:25     Echocardiogram (08/15/17)  Study Conclusions  - Left ventricle: The cavity size was normal. Wall thickness was normal. Systolic function was normal. The estimated ejection fraction was in the range of 55% to  60%. Wall motion was normal; there were no regional wall motion abnormalities. Doppler parameters are consistent with elevated mean left atrial filling pressure. - Right ventricle: The cavity size was moderately dilated. Systolic function was mildly reduced. - Right atrium: The atrium was mildly dilated. - Tricuspid valve: There was moderate regurgitation. - Pulmonary arteries: Systolic pressure was severely increased. PA peak pressure:  77 mm Hg (S).   Subjective: No chest pain or dyspnea.  Discharge Exam: Vitals:   08/17/17 0854 08/17/17 1244  BP: (!) 189/75 (!) 167/76  Pulse:    Resp:    Temp: 98.2 F (36.8 C) 99 F (37.2 C)  SpO2:     Vitals:   08/17/17 0420 08/17/17 0427 08/17/17 0854 08/17/17 1244  BP:  (!) 156/84 (!) 189/75 (!) 167/76  Pulse:  75    Resp:  16    Temp:  98.2 F (36.8 C) 98.2 F (36.8 C) 99 F (37.2 C)  TempSrc:    Oral  SpO2:  95%    Weight: 63.7 kg (140 lb 8 oz)     Height:        General: Pt is alert, awake, not in acute distress Cardiovascular: RRR, S1/S2 +, no rubs, no gallops Respiratory: CTA bilaterally, no wheezing, no rhonchi Abdominal: Soft, NT, ND, bowel sounds + Extremities: no edema, no cyanosis    The results of significant diagnostics from this hospitalization (including imaging, microbiology, ancillary and laboratory) are listed below for reference.     Microbiology: Recent Results (from the past 240 hour(s))  MRSA PCR Screening     Status: None   Collection Time: 08/15/17 11:48 AM  Result Value Ref Range Status   MRSA by PCR NEGATIVE NEGATIVE Final    Comment:        The GeneXpert MRSA Assay (FDA approved for NASAL specimens only), is one component of a comprehensive MRSA colonization surveillance program. It is not intended to diagnose MRSA infection nor to guide or monitor treatment for MRSA infections.      Labs: BNP (last 3 results)  Recent Labs  08/15/17 0400  BNP 983.3*   Basic Metabolic Panel:  Recent Labs Lab 08/14/17 1331 08/15/17 0401 08/16/17 0331  NA 140 139 140  K 4.3 3.8 3.9  CL 109 108 109  CO2 24 23 25   GLUCOSE 125* 82 141*  BUN 29* 23* 18  CREATININE 1.13* 1.05* 0.95  CALCIUM 10.1 9.4 9.4  MG  --   --  1.5*  PHOS  --   --  3.9   Liver Function Tests:  Recent Labs Lab 08/16/17 0331  AST 42*  ALT 24  ALKPHOS 38  BILITOT 0.6  PROT 5.7*  ALBUMIN 3.0*   No results for input(s): LIPASE, AMYLASE in the  last 168 hours. No results for input(s): AMMONIA in the last 168 hours. CBC:  Recent Labs Lab 08/14/17 1331 08/15/17 0401 08/16/17 0331 08/17/17 0352  WBC 6.9 3.9* 3.6* 3.8*  NEUTROABS  --   --  1.7  --   HGB 13.3 12.0 11.6* 12.1  HCT 40.7 36.8 35.7* 36.5  MCV 94.4 94.4 94.7 93.6  PLT 231 185 179 187   Cardiac Enzymes:  Recent Labs Lab 08/15/17 0401 08/15/17 1209  TROPONINI 0.16* 0.13*   BNP: Invalid input(s): POCBNP CBG:  Recent Labs Lab 08/16/17 1136 08/16/17 1648 08/16/17 2049 08/17/17 0727 08/17/17 1118  GLUCAP 144* 156* 144* 97 189*   D-Dimer No results for input(s): DDIMER in the last 72  hours. Hgb A1c  Recent Labs  08/15/17 0401  HGBA1C 6.1*   Lipid Profile  Recent Labs  08/15/17 0401  CHOL 142  HDL 28*  LDLCALC 58  TRIG 281*  CHOLHDL 5.1   Thyroid function studies No results for input(s): TSH, T4TOTAL, T3FREE, THYROIDAB in the last 72 hours.  Invalid input(s): FREET3 Anemia work up No results for input(s): VITAMINB12, FOLATE, FERRITIN, TIBC, IRON, RETICCTPCT in the last 72 hours. Urinalysis    Component Value Date/Time   COLORURINE ORANGE (A) 01/07/2013 2052   APPEARANCEUR TURBID (A) 01/07/2013 2052   LABSPEC 1.024 01/07/2013 2052   PHURINE 5.0 01/07/2013 2052   GLUCOSEU 100 (A) 01/07/2013 2052   HGBUR TRACE (A) 01/07/2013 2052   BILIRUBINUR MODERATE (A) 01/07/2013 2052   KETONESUR 15 (A) 01/07/2013 2052   PROTEINUR 100 (A) 01/07/2013 2052   UROBILINOGEN 2.0 (H) 01/07/2013 2052   NITRITE NEGATIVE 01/07/2013 2052   LEUKOCYTESUR MODERATE (A) 01/07/2013 2052   Microbiology Recent Results (from the past 240 hour(s))  MRSA PCR Screening     Status: None   Collection Time: 08/15/17 11:48 AM  Result Value Ref Range Status   MRSA by PCR NEGATIVE NEGATIVE Final    Comment:        The GeneXpert MRSA Assay (FDA approved for NASAL specimens only), is one component of a comprehensive MRSA colonization surveillance program. It is  not intended to diagnose MRSA infection nor to guide or monitor treatment for MRSA infections.      Time coordinating discharge: Over 30 minutes  SIGNED:   Cordelia Poche, MD [facility] 08/17/2017, 3:47 PM Pager [phone]  If 7PM-7AM, please contact night-coverage [email or web address] Password [email or web address]

## 2017-08-17 DIAGNOSIS — R748 Abnormal levels of other serum enzymes: Secondary | ICD-10-CM

## 2017-08-17 LAB — CBC
HCT: 36.5 % (ref 36.0–46.0)
HEMOGLOBIN: 12.1 g/dL (ref 12.0–15.0)
MCH: 31 pg (ref 26.0–34.0)
MCHC: 33.2 g/dL (ref 30.0–36.0)
MCV: 93.6 fL (ref 78.0–100.0)
PLATELETS: 187 10*3/uL (ref 150–400)
RBC: 3.9 MIL/uL (ref 3.87–5.11)
RDW: 12.9 % (ref 11.5–15.5)
WBC: 3.8 10*3/uL — ABNORMAL LOW (ref 4.0–10.5)

## 2017-08-17 LAB — GLUCOSE, CAPILLARY
Glucose-Capillary: 97 mg/dL (ref 65–99)
Glucose-Capillary: 189 mg/dL — ABNORMAL HIGH (ref 65–99)
Glucose-Capillary: 108 mg/dL — ABNORMAL HIGH (ref 65–99)

## 2017-08-17 LAB — PROTIME-INR
INR: 1.08
PROTHROMBIN TIME: 13.9 s (ref 11.4–15.2)

## 2017-08-17 LAB — HEPARIN LEVEL (UNFRACTIONATED): HEPARIN UNFRACTIONATED: 0.44 [IU]/mL (ref 0.30–0.70)

## 2017-08-17 MED ORDER — WARFARIN SODIUM 2 MG PO TABS
3.0000 mg | ORAL_TABLET | Freq: Every day | ORAL | Status: AC
  Administered 2017-08-17: 18:00:00 3 mg via ORAL
  Filled 2017-08-17: qty 1

## 2017-08-17 MED ORDER — ENOXAPARIN SODIUM 100 MG/ML ~~LOC~~ SOLN
1.5000 mg/kg | SUBCUTANEOUS | Status: AC
  Administered 2017-08-17: 95 mg via SUBCUTANEOUS
  Filled 2017-08-17: qty 1

## 2017-08-17 MED ORDER — ENOXAPARIN SODIUM 100 MG/ML ~~LOC~~ SOLN: 1.5000 mg/kg | SUBCUTANEOUS | 0 refills | Status: DC

## 2017-08-17 MED ORDER — WARFARIN SODIUM 2.5 MG PO TABS: 2.5000 mg | ORAL_TABLET | Freq: Once | ORAL | Status: DC

## 2017-08-17 MED ORDER — WARFARIN SODIUM 3 MG PO TABS: 3.0000 mg | ORAL_TABLET | Freq: Every day | ORAL | 0 refills | Status: DC

## 2017-08-17 NOTE — Progress Notes (Signed)
Physical Therapy Treatment Patient Details Name: Jennifer Warner MRN: [id number] DOB: [birth year] Today's Date: 08/17/2017    History of Present Illness Pt is an 83 y/o female admitted secondary to SOB, chest pain and having a syncopal episode. Pt found to have a PE. Heparin was initiated and within therapeutic range during evaluation. PMH including but not limited to CAD s/p CABG in 2009, HTN, DM and CKD.    PT Comments    Patient is progressing well towards PT goals. Tolerated gait training with Min guard assist-supervision for safety. Pt with mild staggering noted and reaching for the rail at times but no overt LOB. 2/4 DOE. Sp02 remained >92% on RA. Encouraged increasing activity and mobility while in hospital. Will continue to follow to improve balance and endurance.    Follow Up Recommendations  No PT follow up     Equipment Recommendations  None recommended by PT    Recommendations for Other Services       Precautions / Restrictions Precautions Precautions: Fall Restrictions Weight Bearing Restrictions: No    Mobility  Bed Mobility               General bed mobility comments: Sitting EOB upon PT arrival.   Transfers Overall transfer level: Modified independent Equipment used: None             General transfer comment: Stood from EOB x1, no assist needed, no difficulty.   Ambulation/Gait Ambulation/Gait assistance: Min guard Ambulation Distance (Feet): 250 Feet Assistive device: None Gait Pattern/deviations: Step-through pattern;Decreased stride length;Staggering right;Staggering left;Narrow base of support Gait velocity: decreased Gait velocity interpretation: <1.8 ft/sec, indicative of risk for recurrent falls General Gait Details: mild staggering noted and pt reaching for handrail for support at times. 2/4 DOE. Sp02 >92% on RA. Able to walk and talk.   Stairs            Wheelchair Mobility    Modified Rankin (Stroke Patients Only)        Balance Overall balance assessment: Needs assistance Sitting-balance support: Feet supported;No upper extremity supported Sitting balance-Leahy Scale: Good     Standing balance support: During functional activity;Single extremity supported Standing balance-Leahy Scale: Fair Standing balance comment: Able to stand statically without difficulty or support.                             Cognition Arousal/Alertness: Awake/alert Behavior During Therapy: WFL for tasks assessed/performed Overall Cognitive Status: Within Functional Limits for tasks assessed                                        Exercises      General Comments General comments (skin integrity, edema, etc.): VSS throughout.       Pertinent Vitals/Pain Pain Assessment: No/denies pain    Home Living                      Prior Function            PT Goals (current goals can now be found in the care plan section) Progress towards PT goals: Progressing toward goals    Frequency    Min 3X/week      PT Plan Current plan remains appropriate    Co-evaluation              AM-PAC PT "6 Clicks"  Daily Activity  Outcome Measure  Difficulty turning over in bed (including adjusting bedclothes, sheets and blankets)?: None Difficulty moving from lying on back to sitting on the side of the bed? : None Difficulty sitting down on and standing up from a chair with arms (e.g., wheelchair, bedside commode, etc,.)?: None Help needed moving to and from a bed to chair (including a wheelchair)?: None Help needed walking in hospital room?: A Little Help needed climbing 3-5 steps with a railing? : A Little 6 Click Score: 22    End of Session Equipment Utilized During Treatment: Gait belt Activity Tolerance: Patient tolerated treatment well Patient left: in bed;with call bell/phone within reach (sitting EOB.) Nurse Communication: Mobility status PT Visit Diagnosis: Other  abnormalities of gait and mobility (R26.89)     Time: 8366-2947 PT Time Calculation (min) (ACUTE ONLY): 23 min  Charges:  $Gait Training: 8-22 mins $Therapeutic Exercise: 8-22 mins                    G Codes:       Wray Kearns, PT, DPT [phone]     [place] 08/17/2017, 10:16 AM

## 2017-08-17 NOTE — Discharge Instructions (Addendum)
Pulmonary Embolism °A pulmonary embolism (PE) is a sudden blockage or decrease of blood flow in one lung or both lungs. Most blockages come from a blood clot that travels from the legs or the pelvis to the lungs. PE is a dangerous and potentially life-threatening condition if it is not treated right away. °What are the causes? °A pulmonary embolism occurs most commonly when a blood clot travels from one of your veins to your lungs. Rarely, PE is caused by air, fat, amniotic fluid, or part of a tumor traveling through your veins to your lungs. °What increases the risk? °A PE is more likely to develop in: °· People who smoke. °· People who are older, especially over 60 years of age. °· People who are overweight (obese). °· People who sit or lie still for a long time, such as during long-distance travel (over 4 hours), bed rest, hospitalization, or during recovery from certain medical conditions like a stroke. °· People who do not engage in much physical activity (sedentary lifestyle). °· People who have chronic breathing disorders. °· People who have a personal or family history of blood clots or blood clotting disease. °· People who have peripheral vascular disease (PVD), diabetes, or some types of cancer. °· People who have heart disease, especially if the person had a recent heart attack or has congestive heart failure. °· People who have neurological diseases that affect the legs (leg paresis). °· People who have had a traumatic injury, such as breaking a hip or leg. °· People who have recently had major or lengthy surgery, especially on the hip, knee, or abdomen. °· People who have had a central line placed inside a large vein. °· People who take medicines that contain the hormone estrogen. These include birth control pills and hormone replacement therapy. °· Pregnancy or during childbirth or the postpartum period. ° °What are the signs or symptoms? °The symptoms of a PE usually start suddenly and  include: °· Shortness of breath while active or at rest. °· Coughing or coughing up blood or blood-tinged mucus. °· Chest pain that is often worse with deep breaths. °· Rapid or irregular heartbeat. °· Feeling light-headed or dizzy. °· Fainting. °· Feeling anxious. °· Sweating. ° °There may also be pain and swelling in a leg if that is where the blood clot started. °These symptoms may represent a serious problem that is an emergency. Do not wait to see if the symptoms will go away. Get medical help right away. Call your local emergency services (911 in the [place] Do not drive yourself to the hospital. °How is this diagnosed? °Your health care provider will take a medical history and perform a physical exam. You may also have other tests, including: °· Blood tests to assess the clotting properties of your blood, assess oxygen levels in your blood, and find blood clots. °· Imaging tests, such as CT, ultrasound, MRI, X-ray, and other tests to see if you have clots anywhere in your body. °· An electrocardiogram (ECG) to look for heart strain from blood clots in the lungs. ° °How is this treated? °The main goals of PE treatment are: °· To stop a blood clot from growing larger. °· To stop new blood clots from forming. ° °The type of treatment that you receive depends on many factors, such as the cause of your PE, your risk for bleeding or developing more clots, and other medical conditions that you have. Sometimes, a combination of treatments is necessary. °This condition may be treated with: °· Medicines, including newer oral blood thinners (  anticoagulants), warfarin, low molecular weight heparins, thrombolytics, or heparins. °· Wearing compression stockings or using different types of devices. °· Surgery (rare) to remove the blood clot or to place a filter in your abdomen to stop the blood clot from traveling to your lungs. ° °Treatments for a PE are often divided into immediate treatment, long-term treatment (up to 3  months after PE), and extended treatment (more than 3 months after PE). Your treatment may continue for several months. This is called maintenance therapy, and it is used to prevent the forming of new blood clots. You can work with your health care provider to choose the treatment program that is best for you. °What are anticoagulants? °Anticoagulants are medicines that treat PEs. They can stop current blood clots from growing and stop new clots from forming. They cannot dissolve existing clots. Your body dissolves clots by itself over time. Anticoagulants are given by mouth, by injection, or through an IV tube. °What are thrombolytics? °Thrombolytics are clot-dissolving medicines that are used to dissolve a PE. They carry a high risk of bleeding, so they tend to be used only in severe cases or if you have very low blood pressure. °Follow these instructions at home: °If you are taking a newer oral anticoagulant: °· Take the medicine every single day at the same time each day. °· Understand what foods and drugs interact with this medicine. °· Understand that there are no regular blood tests required when using this medicine. °· Understand the side effects of this medicine, including excessive bruising or bleeding. Ask your health care provider or pharmacist about other possible side effects. °If you are taking warfarin: °· Understand how to take warfarin and know which foods can affect how warfarin works in your body. °· Understand that it is dangerous to take too much or too little warfarin. Too much warfarin increases the risk of bleeding. Too little warfarin continues to allow the risk for blood clots. °· Follow your PT and INR blood testing schedule. The PT and INR results allow your health care provider to adjust your dose of warfarin. It is very important that you have your PT and INR tested as often as told by your health care provider. °· Avoid major changes in your diet, or tell your health care provider  before you change your diet. Arrange a visit with a registered dietitian to answer your questions. Many foods, especially foods that are high in vitamin K, can interfere with warfarin and affect the PT and INR results. Eat a consistent amount of foods that are high in vitamin K, such as: °? Spinach, kale, broccoli, cabbage, collard greens, turnip greens, Brussels sprouts, peas, cauliflower, seaweed, and parsley. °? Beef liver and pork liver. °? Green tea. °? Soybean oil. °· Tell your health care provider about any and all medicines, vitamins, and supplements that you take, including aspirin and other over-the-counter anti-inflammatory medicines. Be especially cautious with aspirin and anti-inflammatory medicines. Do not take those before you ask your health care provider if it is safe to do so. This is important because many medicines can interfere with warfarin and affect the PT and INR results. °· Do not start or stop taking any over-the-counter or prescription medicine unless your health care provider or pharmacist tells you to do so. °If you take warfarin, you will also need to do these things: °· Hold pressure over cuts for longer than usual. °· Tell your dentist and other health care providers that you are taking warfarin before you have   any procedures in which bleeding may occur. °· Avoid alcohol or drink very small amounts. Tell your health care provider if you change your alcohol intake. °· Do not use tobacco products, including cigarettes, chewing tobacco, and e-cigarettes. If you need help quitting, ask your health care provider. °· Avoid contact sports. ° °General instructions °· Take over-the-counter and prescription medicines only as told by your health care provider. Anticoagulant medicines can have side effects, including easy bruising and difficulty stopping bleeding. If you are prescribed an anticoagulant, you will also need to do these things: °? Hold pressure over cuts for longer than  usual. °? Tell your dentist and other health care providers that you are taking anticoagulants before you have any procedures in which bleeding may occur. °? Avoid contact sports. °· Wear a medical alert bracelet or carry a medical alert card that says you have had a PE. °· Ask your health care provider how soon you can go back to your normal activities. Stay active to prevent new blood clots from forming. °· Make sure to exercise while traveling or when you have been sitting or standing for a long period of time. It is very important to exercise. Exercise your legs by walking or by tightening and relaxing your leg muscles often. Take frequent walks. °· Wear compression stockings as told by your health care provider to help prevent more blood clots from forming. °· Do not use tobacco products, including cigarettes, chewing tobacco, and e-cigarettes. If you need help quitting, ask your health care provider. °· Keep all follow-up appointments with your health care provider. This is important. °How is this prevented? °Take these actions to decrease your risk of developing another PE: °· Exercise regularly. For at least 30 minutes every day, engage in: °? Activity that involves moving your arms and legs. °? Activity that encourages good blood flow through your body by increasing your heart rate. °· Exercise your arms and legs every hour during long-distance travel (over 4 hours). Drink plenty of water and avoid drinking alcohol while traveling. °· Avoid sitting or lying in bed for long periods of time without moving your legs. °· Maintain a weight that is appropriate for your height. Ask your health care provider what weight is healthy for you. °· If you are a woman who is over 35 years of age, avoid unnecessary use of medicines that contain estrogen. These include birth control pills. °· Do not smoke, especially if you take estrogen medicines. If you need help quitting, ask your health care provider. °· If you are at  very high risk for PE, wear compression stockings. °· If you recently had a PE, have regularly scheduled ultrasound testing on your legs to check for new blood clots. ° °If you are hospitalized, prevention measures may include: °· Early walking after surgery, as soon as your health care provider says that it is safe. °· Receiving anticoagulants to prevent blood clots. If you cannot take anticoagulants, other options may be available, such as wearing compression stockings or using different types of devices. ° °Get help right away if: °· You have new or increased pain, swelling, or redness in an arm or leg. °· You have numbness or tingling in an arm or leg. °· You have shortness of breath while active or at rest. °· You have chest pain. °· You have a rapid or irregular heartbeat. °· You feel light-headed or dizzy. °· You cough up blood. °· You notice blood in your vomit, bowel movement, or   urine.  You have a fever. These symptoms may represent a serious problem that is an emergency. Do not wait to see if the symptoms will go away. Get medical help right away. Call your local emergency services (911 in the [place] Do not drive yourself to the hospital. This information is not intended to replace advice given to you by your health care provider. Make sure you discuss any questions you have with your health care provider. Document Released: 09/30/2000 Document Revised: 03/10/2016 Document Reviewed: 01/28/2015 Elsevier Interactive Patient Education  2017 [employer] on my medicine - Coumadin   (Warfarin)  This medication education was reviewed with me or my healthcare representative as part of my discharge preparation.  The pharmacist that spoke with me during my hospital stay was:  Betsey Holiday, [facility]  Why was Coumadin prescribed for you? Coumadin was prescribed for you because you have a blood clot or a medical condition that can cause an increased risk of forming blood clots. Blood  clots can cause serious health problems by blocking the flow of blood to the heart, lung, or brain. Coumadin can prevent harmful blood clots from forming. As a reminder your indication for Coumadin is:   Pulmonary Embolism Treatment  What test will check on my response to Coumadin? While on Coumadin (warfarin) you will need to have an INR test regularly to ensure that your dose is keeping you in the desired range. The INR (international normalized ratio) number is calculated from the result of the laboratory test called prothrombin time (PT).  If an INR APPOINTMENT HAS NOT ALREADY BEEN MADE FOR YOU please schedule an appointment to have this lab work done by your health care provider within 7 days. Your INR goal is usually a number between:  2 to 3 or your provider may give you a more narrow range like 2-2.5.  Ask your health care provider during an office visit what your goal INR is.  What  do you need to  know  About  COUMADIN? Take Coumadin (warfarin) exactly as prescribed by your healthcare provider about the same time each day.  DO NOT stop taking without talking to the doctor who prescribed the medication.  Stopping without other blood clot prevention medication to take the place of Coumadin may increase your risk of developing a new clot or stroke.  Get refills before you run out.  What do you do if you miss a dose? If you miss a dose, take it as soon as you remember on the same day then continue your regularly scheduled regimen the next day.  Do not take two doses of Coumadin at the same time.  Important Safety Information A possible side effect of Coumadin (Warfarin) is an increased risk of bleeding. You should call your healthcare provider right away if you experience any of the following: ? Bleeding from an injury or your nose that does not stop. ? Unusual colored urine (red or dark brown) or unusual colored stools (red or black). ? Unusual bruising for unknown reasons. ? A serious  fall or if you hit your head (even if there is no bleeding).  Some foods or medicines interact with Coumadin (warfarin) and might alter your response to warfarin. To help avoid this: ? Eat a balanced diet, maintaining a consistent amount of Vitamin K. ? Notify your provider about major diet changes you plan to make. ? Avoid alcohol or limit your intake to 1 drink for women and 2  drinks for men per day. (1 drink is 5 oz. wine, 12 oz. beer, or 1.5 oz. liquor.)  Make sure that ANY health care provider who prescribes medication for you knows that you are taking Coumadin (warfarin).  Also make sure the healthcare provider who is monitoring your Coumadin knows when you have started a new medication including herbals and non-prescription products.  Coumadin (Warfarin)  Major Drug Interactions  Increased Warfarin Effect Decreased Warfarin Effect  Alcohol (large quantities) Antibiotics (esp. Septra/Bactrim, Flagyl, Cipro) Amiodarone (Cordarone) Aspirin (ASA) Cimetidine (Tagamet) Megestrol (Megace) NSAIDs (ibuprofen, naproxen, etc.) Piroxicam (Feldene) Propafenone (Rythmol SR) Propranolol (Inderal) Isoniazid (INH) Posaconazole (Noxafil) Barbiturates (Phenobarbital) Carbamazepine (Tegretol) Chlordiazepoxide (Librium) Cholestyramine (Questran) Griseofulvin Oral Contraceptives Rifampin Sucralfate (Carafate) Vitamin K   Coumadin (Warfarin) Major Herbal Interactions  Increased Warfarin Effect Decreased Warfarin Effect  Garlic Ginseng Ginkgo biloba Coenzyme Q10 Green tea St. Johns wort    Coumadin (Warfarin) FOOD Interactions  Eat a consistent number of servings per week of foods HIGH in Vitamin K (1 serving =  cup)  Collards (cooked, or boiled & drained) Kale (cooked, or boiled & drained) Mustard greens (cooked, or boiled & drained) Parsley *serving size only =  cup Spinach (cooked, or boiled & drained) Swiss chard (cooked, or boiled & drained) Turnip greens (cooked, or  boiled & drained)  Eat a consistent number of servings per week of foods MEDIUM-HIGH in Vitamin K (1 serving = 1 cup)  Asparagus (cooked, or boiled & drained) Broccoli (cooked, boiled & drained, or raw & chopped) Brussel sprouts (cooked, or boiled & drained) *serving size only =  cup Lettuce, raw (green leaf, endive, romaine) Spinach, raw Turnip greens, raw & chopped   These websites have more information on Coumadin (warfarin):  Fail[employer].se; VeganReport.com.au;

## 2017-08-17 NOTE — Progress Notes (Addendum)
[facility] for heparin Indication: PE  Allergies  Allergen Reactions  . Levofloxacin Nausea And Vomiting    Patient Measurements: Height: 5\' 3"  (160 cm) Weight: 140 lb 8 oz (63.7 kg) IBW/kg (Calculated) : 52.4 Heparin Dosing Weight: 63 kg  Vital Signs: Temp: 98.2 F (36.8 C) (11/01 0427) Temp Source: Oral (10/31 2351) BP: 156/84 (11/01 0427) Pulse Rate: 75 (11/01 0427)  Labs:  Recent Labs  08/14/17 1331 08/15/17 0400  08/15/17 0401 08/15/17 1209 08/15/17 2009 08/16/17 0331 08/16/17 1122 08/17/17 0352  HGB 13.3  --   --  12.0  --   --  11.6*  --  12.1  HCT 40.7  --   --  36.8  --   --  35.7*  --  36.5  PLT 231  --   --  185  --   --  179  --  187  APTT  --  55*  --   --   --   --   --   --   --   LABPROT  --  14.5  --   --   --   --   --   --  13.9  INR  --  1.14  --   --   --   --   --   --  1.08  HEPARINUNFRC  --   --   < > 0.12* 0.53 0.49  --  0.46 0.44  CREATININE 1.13*  --   --  1.05*  --   --  0.95  --   --   TROPONINI  --   --   --  0.16* 0.13*  --   --   --   --   < > = values in this interval not displayed.  Estimated Creatinine Clearance: 41.7 mL/min (by C-G formula based on SCr of 0.95 mg/dL).  Assessment: 83 y.o. female on heparin drip for CT finding of acute submassive PE w/ R heart strain (unprovoked). No anticoagulation prior to admission.   Heparin level was therapeutic at 0.44 on 1000 units/hr. INR changed from 1.14 to 1.08. CBC is stable. No interruptions with infusion. No bleeding noted. On concurrent fenofibrate, may impact warfarin sensitivity.   Goal of Therapy:  Heparin level 0.3-0.7 units/ml Monitor platelets by anticoagulation protocol: Yes   Plan:  Continue heparin infusion at 1000 units/hr Order warfarin 3 mg tonight  Monitor daily heparin level, CBC, INR, and s/sx of bleeding  Doylene Canard, PharmD Clinical Pharmacist  Pager: [phone] Clinical Phone for 08/17/2017 until 3:30pm:  [phone] If after 3:30pm, please call main pharmacy at [facility] to transition from heparin to enoxaparin (prescription ready at [facility]). Patient is to follow up with INR clinic on Monday.   1. Discontinue heparin infusion. 2. Give enoxaparin 95 mg (1.5 mg/kg) an hour after heparin discontinued. 3. Would recommend warfarin 3 mg daily at time of discharge (given potential interaction with fenofibrate and age>80).  Patient was educated on both warfarin and enoxaparin and aware of plan.  Doylene Canard, PharmD Clinical Pharmacist

## 2017-08-21 ENCOUNTER — Other Ambulatory Visit: Payer: Self-pay | Admitting: *Deleted

## 2017-08-21 ENCOUNTER — Ambulatory Visit (INDEPENDENT_AMBULATORY_CARE_PROVIDER_SITE_OTHER): Payer: Medicare Other | Admitting: Pharmacist Clinician (PhC)/ Clinical Pharmacy Specialist

## 2017-08-21 DIAGNOSIS — Z7901 Long term (current) use of anticoagulants: Secondary | ICD-10-CM

## 2017-08-21 DIAGNOSIS — I2699 Other pulmonary embolism without acute cor pulmonale: Secondary | ICD-10-CM

## 2017-08-21 MED ORDER — WARFARIN SODIUM 3 MG PO TABS: ORAL_TABLET | ORAL | 0 refills | Status: DC

## 2017-08-22 ENCOUNTER — Other Ambulatory Visit: Payer: Self-pay | Admitting: Pharmacist Clinician (PhC)/ Clinical Pharmacy Specialist

## 2017-08-22 LAB — PROTIME-INR
INR: 3.1 — ABNORMAL HIGH (ref 0.8–1.2)
PROTHROMBIN TIME: 29.9 s — AB (ref 9.1–12.0)

## 2017-08-22 MED ORDER — WARFARIN SODIUM 3 MG PO TABS: ORAL_TABLET | ORAL | 0 refills | Status: DC

## 2017-08-24 DIAGNOSIS — L820 Inflamed seborrheic keratosis: Secondary | ICD-10-CM | POA: Diagnosis not present

## 2017-08-24 DIAGNOSIS — C44329 Squamous cell carcinoma of skin of other parts of face: Secondary | ICD-10-CM | POA: Diagnosis not present

## 2017-08-24 DIAGNOSIS — C44321 Squamous cell carcinoma of skin of nose: Secondary | ICD-10-CM | POA: Diagnosis not present

## 2017-08-24 DIAGNOSIS — D0439 Carcinoma in situ of skin of other parts of face: Secondary | ICD-10-CM | POA: Diagnosis not present

## 2017-08-25 ENCOUNTER — Ambulatory Visit (INDEPENDENT_AMBULATORY_CARE_PROVIDER_SITE_OTHER): Payer: Medicare Other | Admitting: Pharmacist

## 2017-08-25 DIAGNOSIS — I2699 Other pulmonary embolism without acute cor pulmonale: Secondary | ICD-10-CM | POA: Diagnosis not present

## 2017-08-25 DIAGNOSIS — Z7901 Long term (current) use of anticoagulants: Secondary | ICD-10-CM

## 2017-08-25 LAB — POCT INR: INR: 5.5

## 2017-08-25 NOTE — Progress Notes (Signed)
HOLD warfarin dose today only, then decrease to 1/2 tablet Saturday and Sunday, 1 tablet ( 3mg  Monday) Repeat INR on Tuesday 11/13

## 2017-08-29 ENCOUNTER — Encounter: Payer: Self-pay | Admitting: Cardiology

## 2017-08-29 ENCOUNTER — Ambulatory Visit (INDEPENDENT_AMBULATORY_CARE_PROVIDER_SITE_OTHER): Payer: Medicare Other | Admitting: Pharmacist Clinician (PhC)/ Clinical Pharmacy Specialist

## 2017-08-29 ENCOUNTER — Ambulatory Visit (INDEPENDENT_AMBULATORY_CARE_PROVIDER_SITE_OTHER): Payer: Medicare Other | Admitting: Cardiology

## 2017-08-29 VITALS — BP 138/72 | HR 73 | Ht 63.0 in | Wt 139.4 lb

## 2017-08-29 DIAGNOSIS — I209 Angina pectoris, unspecified: Secondary | ICD-10-CM | POA: Diagnosis not present

## 2017-08-29 DIAGNOSIS — I2699 Other pulmonary embolism without acute cor pulmonale: Secondary | ICD-10-CM

## 2017-08-29 DIAGNOSIS — Z7901 Long term (current) use of anticoagulants: Secondary | ICD-10-CM

## 2017-08-29 DIAGNOSIS — I25708 Atherosclerosis of coronary artery bypass graft(s), unspecified, with other forms of angina pectoris: Secondary | ICD-10-CM

## 2017-08-29 DIAGNOSIS — Z951 Presence of aortocoronary bypass graft: Secondary | ICD-10-CM

## 2017-08-29 DIAGNOSIS — I10 Essential (primary) hypertension: Secondary | ICD-10-CM

## 2017-08-29 DIAGNOSIS — E782 Mixed hyperlipidemia: Secondary | ICD-10-CM

## 2017-08-29 LAB — POCT INR: INR: 3.9

## 2017-08-29 NOTE — Progress Notes (Signed)
Jennifer Warner Date of Birth: December 31, 1933 Medical Record #[id number]  History of Present Illness: [name] is seen for followup of post hospital follow up. She  has a history of hypertension and dyslipidemia. She also has a history of diabetes- on insulin. In 2009 she presented with new onset of angina. A Myoview study was abnormal. This led to cardiac catheterization which demonstrated a critical ostial LAD stenosis. She  underwent two-vessel coronary bypass surgery in [place] with an LIMA graft to the LAD and a saphenous vein graft to the RCA. She had a normal Myoview study in April 2015.  In July 2017 she presented with chest pain and underwent a cardiac cath. She had a left radial loop. She has patent grafts. There is some high grade obstruction in the mid circumflex between the large first and third obtuse marginal branch. There is also 70% stenosis in the PDA. She was continued on medical therapy. When seen in August she noted some intermittent palpitations but deferred event monitor at that time.  She was recently admitted 10/29-11/1/18 with a 2 week history of chest pain and SOB. She later developed dizziness and had a syncopal episode. Troponin mildly elevated at 0.18. CT of chest was done which showed submassive PE with CTevidence of right heart strain.  Lower extremity venous Dopplers negative for acute DVT. Placed on IV heparin drip. Evidence of right heart strain on CT scan with evidence of mildly reduced right ventricle on echocardiogram. Interventional radiology consulted for possible thrombectomy/thrombolysis but recommend risks outweighed benefits as patient improved significantly. Patient transitioned to Coumadin secondary to affordability under her insurance and heparin transitioned to Lovenox.   On follow up today she is doing OK. Her breathing is doing well. She denies any chest pain. No bleeding. No edema. No palpitations. Tolerating medication well.   Current  Outpatient Medications on File Prior to Visit  Medication Sig Dispense Refill  . atorvastatin (LIPITOR) 20 MG tablet Take 20 mg by mouth daily at 6 PM.   2  . diphenhydramine-acetaminophen (TYLENOL PM) 25-500 MG TABS Take 2 tablets by mouth at bedtime.    . fenofibrate (TRICOR) 145 MG tablet TAKE 1 TABLET BY MOUTH EVERY EVENING 90 tablet 2  . LANTUS SOLOSTAR 100 UNIT/ML injection Inject 25 Units into the skin at bedtime.     . metoprolol tartrate (LOPRESSOR) 25 MG tablet Take 1 tablet (25 mg total) by mouth 2 (two) times daily. 180 tablet 3  . nitroGLYCERIN (NITROSTAT) 0.4 MG SL tablet Place 1 tablet (0.4 mg total) under the tongue every 5 (five) minutes x 3 doses as needed for chest pain. 25 tablet 2  . omeprazole (PRILOSEC) 20 MG capsule Take 1 capsule (20 mg total) by mouth every evening. 90 capsule 3  . Vitamin D, Ergocalciferol, (DRISDOL) 50000 units CAPS capsule Take 50,000 Units by mouth 2 (two) times a week.  1  . warfarin (COUMADIN) 3 MG tablet Take 1 tablet by mouth daily or as directed 30 tablet 0   No current facility-administered medications on file prior to visit.     Allergies  Allergen Reactions  . Levofloxacin Nausea And Vomiting    Past Medical History:  Diagnosis Date  . Acute thyroiditis   . CAD (coronary artery disease)   . CAP (community acquired pneumonia) 12/2012  . Essential hypertension, benign   . GERD (gastroesophageal reflux disease)   . History of blood transfusion 05/2008   "when I had my bypass"  . Hyperkalemia   .  Hypertriglyceridemia   . Other secondary thrombocytopenia   . Polycystic ovaries   . Postsurgical aortocoronary bypass status   . Pure hypercholesterolemia   . Type II diabetes mellitus ([place])   . Urinary tract infection, site not specified     Past Surgical History:  Procedure Laterality Date  . CARDIAC CATHETERIZATION  05/2008  . CATARACT EXTRACTION W/ INTRAOCULAR LENS  IMPLANT, BILATERAL Bilateral ~ 2000  . [place]    . CORONARY ARTERY BYPASS GRAFT  05/2008   X2, [place] to LAD, SVG to PDA  . DILATION AND CURETTAGE OF UTERUS    . LAPAROSCOPIC CHOLECYSTECTOMY  1990s  . TUBAL LIGATION      Social History   Tobacco Use  Smoking Status Never Smoker  Smokeless Tobacco Never Used    Social History   Substance and Sexual Activity  Alcohol Use No    Family History  Problem Relation Age of Onset  . Liver disease Mother 65       deceased  . Cancer Brother        x3  . Stroke Brother   . Breast cancer Neg Hx     Review of Systems: As noted in history of present illness  All other systems were reviewed and are negative.  Physical Exam: BP 138/72   Pulse 73   Ht 5\' 3"  (1.6 m)   Wt 139 lb 6.4 oz (63.2 kg)   BMI 24.69 kg/m  GENERAL:  Well appearing WF in NAD HEENT:  PERRL, EOMI, sclera are clear. Oropharynx is clear. Skin biopsy sites clear. NECK:  No jugular venous distention, carotid upstroke brisk and symmetric, no bruits, no thyromegaly or adenopathy LUNGS:  Clear to auscultation bilaterally CHEST:  Unremarkable HEART:  RRR,  PMI not displaced or sustained,S1 and S2 within normal limits, no S3, no S4: no clicks, no rubs, no murmurs ABD:  Soft, nontender. BS +, no masses or bruits. No hepatomegaly, no splenomegaly EXT:  2 + pulses throughout, no edema, no cyanosis no clubbing SKIN:  Warm and dry.  No rashes NEURO:  Alert and oriented x 3. Cranial nerves II through XII intact. PSYCH:  Cognitively intact    LABORATORY DATA:  Lab Results  Component Value Date   WBC 3.8 (L) 08/17/2017   HGB 12.1 08/17/2017   HCT 36.5 08/17/2017   PLT 187 08/17/2017   GLUCOSE 141 (H) 08/16/2017   CHOL 142 08/15/2017   TRIG 281 (H) 08/15/2017   HDL 28 (L) 08/15/2017   LDLCALC 58 08/15/2017   ALT 24 08/16/2017   AST 42 (H) 08/16/2017   NA 140 08/16/2017   K 3.9 08/16/2017   CL 109 08/16/2017   CREATININE 0.95 08/16/2017   BUN 18 08/16/2017   CO2 25 08/16/2017   INR 5.5 08/25/2017   HGBA1C 6.1  (H) 08/15/2017   TTE: 08/15/17  Study Conclusions  - Left ventricle: The cavity size was normal. Wall thickness was normal. Systolic function was normal. The estimated ejection fraction was in the range of 55% to 60%. Wall motion was normal; there were no regional wall motion abnormalities. Doppler parameters are consistent with elevated mean left atrial filling pressure. - Right ventricle: The cavity size was moderately dilated. Systolic function was mildly reduced. - Right atrium: The atrium was mildly dilated. - Tricuspid valve: There was moderate regurgitation. - Pulmonary arteries: Systolic pressure was severely increased. PA peak pressure: 77 mm Hg (S).   CT ANGIOGRAPHY CHEST WITH CONTRAST  TECHNIQUE: Multidetector  CT imaging of the chest was performed using the standard protocol during bolus administration of intravenous contrast. Multiplanar CT image reconstructions and MIPs were obtained to evaluate the vascular anatomy.  CONTRAST:  80 cc Isovue 370 IV  COMPARISON:  05/02/2016 CT and CXR 08/14/2017  FINDINGS: Cardiovascular: Acute pulmonary emboli noted within the distal right and left main pulmonary arteries extending into both lower lobes and right upper lobe. Evidence of right heart strain with RV/ LV ratio 1.11. Moderate aortic atherosclerosis without aneurysm or dissection. Heart is top-normal in size with coronary arteriosclerosis.  Mediastinum/Nodes: No enlarged mediastinal, hilar, or axillary lymph nodes. Thyroid gland, trachea, and esophagus demonstrate no significant findings.  Lungs/Pleura: Faint ground-glass infiltrate in the right upper lobe laterally. Stable calcified granuloma in the right upper lobe. Atelectasis and/or scarring in the superior right lower lobe. No effusion or pneumothorax.  Upper Abdomen: No acute abnormality.  Cholecystectomy.  Musculoskeletal: Status post CABG.  Thoracic spondylosis.  Review of  the MIP images confirms the above findings.  IMPRESSION: 1. Positive for acute PE with CT evidence of right heart strain (RV/LV Ratio = 1.11) consistent with at least submassive (intermediate risk) PE. The presence of right heart strain has been associated with an increased risk of morbidity and mortality. Please activate Code PE by paging [phone]. Critical Value/emergent results were called by telephone at the time of interpretation on 08/14/2017 at 9:25 pm to Dr. Virgel Manifold , who verbally acknowledged these results. 2. Faint ground-glass infiltrate in the right upper lobe, question focus of pneumonia or possibly a pulmonary infarct. 3. Right upper lobe granuloma.  Aortic Atherosclerosis (ICD10-I70.0).   Electronically Signed   By: Ashley Royalty M.D.   On: 08/14/2017 21:25  Assessment / Plan: 1. Acute pulmonary embolus. Unprovoked. Now on Coumadin. I would recommend continuing indefinitely. Will discontinue ASA to minimize bleeding risk. I will follow up in 3 months.  2.  Coronary disease status post 2 vessel CABG in August of 2009. She is asymptomatic. Cardiac cath in July 2017 showed patent grafts. We'll continue with her current medical therapy and risk factor modification. Myoview study normal April 2015.   3. Hyperlipidemia. Continue TriCor and Lipitor.   4. Diabetes mellitus type 2. Good control.   5. Chronic kidney disease stage III. ARB stopped due to hyperkalemia and elevated creatinine.  6. HTN. BP is well controlled.

## 2017-08-29 NOTE — Patient Instructions (Addendum)
Continue your current therapy except stop ASA  I will see you in 3 months

## 2017-09-04 ENCOUNTER — Ambulatory Visit (INDEPENDENT_AMBULATORY_CARE_PROVIDER_SITE_OTHER): Payer: Medicare Other | Admitting: Pharmacist

## 2017-09-04 DIAGNOSIS — Z7901 Long term (current) use of anticoagulants: Secondary | ICD-10-CM | POA: Diagnosis not present

## 2017-09-04 DIAGNOSIS — I2699 Other pulmonary embolism without acute cor pulmonale: Secondary | ICD-10-CM

## 2017-09-04 LAB — POCT INR: INR: 4.2

## 2017-09-05 DIAGNOSIS — E119 Type 2 diabetes mellitus without complications: Secondary | ICD-10-CM | POA: Diagnosis not present

## 2017-09-05 DIAGNOSIS — I2510 Atherosclerotic heart disease of native coronary artery without angina pectoris: Secondary | ICD-10-CM | POA: Diagnosis not present

## 2017-09-05 DIAGNOSIS — I10 Essential (primary) hypertension: Secondary | ICD-10-CM | POA: Diagnosis not present

## 2017-09-13 ENCOUNTER — Ambulatory Visit (INDEPENDENT_AMBULATORY_CARE_PROVIDER_SITE_OTHER): Payer: Medicare Other | Admitting: Pharmacist

## 2017-09-13 DIAGNOSIS — Z7901 Long term (current) use of anticoagulants: Secondary | ICD-10-CM | POA: Diagnosis not present

## 2017-09-13 DIAGNOSIS — I2699 Other pulmonary embolism without acute cor pulmonale: Secondary | ICD-10-CM

## 2017-09-13 LAB — POCT INR: INR: 3.9

## 2017-09-27 ENCOUNTER — Ambulatory Visit (INDEPENDENT_AMBULATORY_CARE_PROVIDER_SITE_OTHER): Payer: Medicare Other | Admitting: Pharmacist Clinician (PhC)/ Clinical Pharmacy Specialist

## 2017-09-27 DIAGNOSIS — Z7901 Long term (current) use of anticoagulants: Secondary | ICD-10-CM

## 2017-09-27 DIAGNOSIS — I2699 Other pulmonary embolism without acute cor pulmonale: Secondary | ICD-10-CM

## 2017-09-27 LAB — POCT INR: INR: 3.2

## 2017-10-02 ENCOUNTER — Other Ambulatory Visit: Payer: Self-pay

## 2017-10-04 ENCOUNTER — Other Ambulatory Visit: Payer: Self-pay | Admitting: Pharmacist

## 2017-10-04 MED ORDER — WARFARIN SODIUM 3 MG PO TABS: ORAL_TABLET | ORAL | 0 refills | Status: DC

## 2017-10-05 DIAGNOSIS — E7800 Pure hypercholesterolemia, unspecified: Secondary | ICD-10-CM | POA: Diagnosis not present

## 2017-10-05 DIAGNOSIS — I10 Essential (primary) hypertension: Secondary | ICD-10-CM | POA: Diagnosis not present

## 2017-10-05 DIAGNOSIS — E1165 Type 2 diabetes mellitus with hyperglycemia: Secondary | ICD-10-CM | POA: Diagnosis not present

## 2017-10-05 DIAGNOSIS — N189 Chronic kidney disease, unspecified: Secondary | ICD-10-CM | POA: Diagnosis not present

## 2017-10-11 ENCOUNTER — Ambulatory Visit (INDEPENDENT_AMBULATORY_CARE_PROVIDER_SITE_OTHER): Payer: Medicare Other | Admitting: Pharmacist Clinician (PhC)/ Clinical Pharmacy Specialist

## 2017-10-11 DIAGNOSIS — Z7901 Long term (current) use of anticoagulants: Secondary | ICD-10-CM

## 2017-10-11 DIAGNOSIS — I2699 Other pulmonary embolism without acute cor pulmonale: Secondary | ICD-10-CM

## 2017-10-11 LAB — POCT INR: INR: 2.6

## 2017-10-24 DIAGNOSIS — Z85828 Personal history of other malignant neoplasm of skin: Secondary | ICD-10-CM | POA: Diagnosis not present

## 2017-10-24 DIAGNOSIS — L905 Scar conditions and fibrosis of skin: Secondary | ICD-10-CM | POA: Diagnosis not present

## 2017-10-27 DIAGNOSIS — I10 Essential (primary) hypertension: Secondary | ICD-10-CM | POA: Diagnosis not present

## 2017-10-27 DIAGNOSIS — E1165 Type 2 diabetes mellitus with hyperglycemia: Secondary | ICD-10-CM | POA: Diagnosis not present

## 2017-10-27 DIAGNOSIS — N189 Chronic kidney disease, unspecified: Secondary | ICD-10-CM | POA: Diagnosis not present

## 2017-10-27 DIAGNOSIS — E7800 Pure hypercholesterolemia, unspecified: Secondary | ICD-10-CM | POA: Diagnosis not present

## 2017-11-02 ENCOUNTER — Ambulatory Visit (INDEPENDENT_AMBULATORY_CARE_PROVIDER_SITE_OTHER): Payer: Medicare Other | Admitting: Pharmacist Clinician (PhC)/ Clinical Pharmacy Specialist

## 2017-11-02 DIAGNOSIS — I2699 Other pulmonary embolism without acute cor pulmonale: Secondary | ICD-10-CM

## 2017-11-02 DIAGNOSIS — Z7901 Long term (current) use of anticoagulants: Secondary | ICD-10-CM

## 2017-11-02 LAB — POCT INR: INR: 2.2

## 2017-11-26 NOTE — Progress Notes (Signed)
Jennifer Warner Date of Birth: 09-03-1934 Medical Record #[id number]  History of Present Illness: [name] is seen for follow up. She  has a history of hypertension and dyslipidemia. She also has a history of diabetes- on insulin. In 2009 she presented with new onset of angina. A Myoview study was abnormal. This led to cardiac catheterization which demonstrated a critical ostial LAD stenosis. She  underwent two-vessel coronary bypass surgery in [place] with an LIMA graft to the LAD and a saphenous vein graft to the RCA. She had a normal Myoview study in April 2015.  In July 2017 she presented with chest pain and underwent a cardiac cath. She had a left radial loop. She has patent grafts. There is some high grade obstruction in the mid circumflex between the large first and third obtuse marginal branch. There is also 70% stenosis in the PDA. She was continued on medical therapy.   She was  admitted 10/29-11/1/18 with a 2 week history of chest pain and SOB. She later developed dizziness and had a syncopal episode. Troponin mildly elevated at 0.18. CT of chest was done which showed submassive PE with CTevidence of right heart strain.  Lower extremity venous Dopplers negative for acute DVT. Placed on IV heparin drip. Evidence of right heart strain on CT scan with evidence of mildly reduced right ventricle on echocardiogram. Interventional radiology consulted for possible thrombectomy/thrombolysis but recommend risks outweighed benefits as patient improved significantly. Patient transitioned to Coumadin secondary to affordability under her insurance.  On follow up today she is doing very well.  She denies any chest pain, SOB, edema. No bleeding. No palpitations. Tolerating medication well. Reports BP at home has been normal.  Current Outpatient Medications on File Prior to Visit  Medication Sig Dispense Refill  . atorvastatin (LIPITOR) 20 MG tablet Take 20 mg by mouth daily at 6 PM.   2   . diphenhydramine-acetaminophen (TYLENOL PM) 25-500 MG TABS Take 2 tablets by mouth at bedtime.    . fenofibrate (TRICOR) 145 MG tablet TAKE 1 TABLET BY MOUTH EVERY EVENING 90 tablet 2  . LANTUS SOLOSTAR 100 UNIT/ML injection Inject 25 Units into the skin at bedtime.     . metoprolol tartrate (LOPRESSOR) 25 MG tablet Take 1 tablet (25 mg total) by mouth 2 (two) times daily. 180 tablet 3  . nitroGLYCERIN (NITROSTAT) 0.4 MG SL tablet Place 1 tablet (0.4 mg total) under the tongue every 5 (five) minutes x 3 doses as needed for chest pain. 25 tablet 2  . omeprazole (PRILOSEC) 20 MG capsule Take 1 capsule (20 mg total) by mouth every evening. 90 capsule 3  . Vitamin D, Ergocalciferol, (DRISDOL) 50000 units CAPS capsule Take 50,000 Units by mouth 2 (two) times a week.  1  . warfarin (COUMADIN) 3 MG tablet Take 1/2 tablet by mouth daily or as directed by coumadin clinic 30 tablet 0   No current facility-administered medications on file prior to visit.     Allergies  Allergen Reactions  . Levofloxacin Nausea And Vomiting    Past Medical History:  Diagnosis Date  . Acute thyroiditis   . CAD (coronary artery disease)   . CAP (community acquired pneumonia) 12/2012  . Essential hypertension, benign   . GERD (gastroesophageal reflux disease)   . History of blood transfusion 05/2008   "when I had my bypass"  . Hyperkalemia   . Hypertriglyceridemia   . Other secondary thrombocytopenia   . Polycystic ovaries   . Postsurgical aortocoronary  bypass status   . Pure hypercholesterolemia   . Type II diabetes mellitus ([place])   . Urinary tract infection, site not specified     Past Surgical History:  Procedure Laterality Date  . CARDIAC CATHETERIZATION  05/2008  . CARDIAC CATHETERIZATION N/A 05/03/2016   Procedure: Left Heart Cath and Coronary Angiography;  Surgeon: Belva Crome, MD;  Location: [facility];  Service: Cardiovascular;  Laterality: N/A;  . CATARACT EXTRACTION W/ INTRAOCULAR LENS   IMPLANT, BILATERAL Bilateral ~ 2000  . [place]  . CORONARY ARTERY BYPASS GRAFT  05/2008   X2, [place] to LAD, SVG to PDA  . DILATION AND CURETTAGE OF UTERUS    . LAPAROSCOPIC CHOLECYSTECTOMY  1990s  . TUBAL LIGATION      Social History   Tobacco Use  Smoking Status Never Smoker  Smokeless Tobacco Never Used    Social History   Substance and Sexual Activity  Alcohol Use No    Family History  Problem Relation Age of Onset  . Liver disease Mother 64       deceased  . Cancer Brother        x3  . Stroke Brother   . Breast cancer Neg Hx     Review of Systems: As noted in history of present illness  All other systems were reviewed and are negative.  Physical Exam: BP (!) 153/74   Pulse 64   Ht 5\' 3"  (1.6 m)   Wt 142 lb 12.8 oz (64.8 kg)   SpO2 93%   BMI 25.30 kg/m  GENERAL:  Well appearing WF in NAD HEENT:  PERRL, EOMI, sclera are clear. Oropharynx is clear. NECK:  No jugular venous distention, carotid upstroke brisk and symmetric, no bruits, no thyromegaly or adenopathy LUNGS:  Clear to auscultation bilaterally CHEST:  Unremarkable HEART:  RRR,  PMI not displaced or sustained,S1 and S2 within normal limits, no S3, no S4: no clicks, no rubs, no murmurs ABD:  Soft, nontender. BS +, no masses or bruits. No hepatomegaly, no splenomegaly EXT:  2 + pulses throughout, no edema, no cyanosis no clubbing SKIN:  Warm and dry.  No rashes NEURO:  Alert and oriented x 3. Cranial nerves II through XII intact. PSYCH:  Cognitively intact  intact    LABORATORY DATA:  Lab Results  Component Value Date   WBC 3.8 (L) 08/17/2017   HGB 12.1 08/17/2017   HCT 36.5 08/17/2017   PLT 187 08/17/2017   GLUCOSE 141 (H) 08/16/2017   CHOL 142 08/15/2017   TRIG 281 (H) 08/15/2017   HDL 28 (L) 08/15/2017   LDLCALC 58 08/15/2017   ALT 24 08/16/2017   AST 42 (H) 08/16/2017   NA 140 08/16/2017   K 3.9 08/16/2017   CL 109 08/16/2017   CREATININE 0.95 08/16/2017   BUN 18  08/16/2017   CO2 25 08/16/2017   INR 2.2 11/02/2017   HGBA1C 6.1 (H) 08/15/2017   TTE: 08/15/17  Study Conclusions  - Left ventricle: The cavity size was normal. Wall thickness was normal. Systolic function was normal. The estimated ejection fraction was in the range of 55% to 60%. Wall motion was normal; there were no regional wall motion abnormalities. Doppler parameters are consistent with elevated mean left atrial filling pressure. - Right ventricle: The cavity size was moderately dilated. Systolic function was mildly reduced. - Right atrium: The atrium was mildly dilated. - Tricuspid valve: There was moderate regurgitation. - Pulmonary arteries: Systolic pressure was severely  increased. PA peak pressure: 77 mm Hg (S).   CT ANGIOGRAPHY CHEST WITH CONTRAST  TECHNIQUE: Multidetector CT imaging of the chest was performed using the standard protocol during bolus administration of intravenous contrast. Multiplanar CT image reconstructions and MIPs were obtained to evaluate the vascular anatomy.  CONTRAST:  80 cc Isovue 370 IV  COMPARISON:  05/02/2016 CT and CXR 08/14/2017  FINDINGS: Cardiovascular: Acute pulmonary emboli noted within the distal right and left main pulmonary arteries extending into both lower lobes and right upper lobe. Evidence of right heart strain with RV/ LV ratio 1.11. Moderate aortic atherosclerosis without aneurysm or dissection. Heart is top-normal in size with coronary arteriosclerosis.  Mediastinum/Nodes: No enlarged mediastinal, hilar, or axillary lymph nodes. Thyroid gland, trachea, and esophagus demonstrate no significant findings.  Lungs/Pleura: Faint ground-glass infiltrate in the right upper lobe laterally. Stable calcified granuloma in the right upper lobe. Atelectasis and/or scarring in the superior right lower lobe. No effusion or pneumothorax.  Upper Abdomen: No acute abnormality.   Cholecystectomy.  Musculoskeletal: Status post CABG.  Thoracic spondylosis.  Review of the MIP images confirms the above findings.  IMPRESSION: 1. Positive for acute PE with CT evidence of right heart strain (RV/LV Ratio = 1.11) consistent with at least submassive (intermediate risk) PE. The presence of right heart strain has been associated with an increased risk of morbidity and mortality. Please activate Code PE by paging [phone]. Critical Value/emergent results were called by telephone at the time of interpretation on 08/14/2017 at 9:25 pm to Dr. Virgel Manifold , who verbally acknowledged these results. 2. Faint ground-glass infiltrate in the right upper lobe, question focus of pneumonia or possibly a pulmonary infarct. 3. Right upper lobe granuloma.  Aortic Atherosclerosis (ICD10-I70.0).   Electronically Signed   By: Ashley Royalty M.D.   On: 08/14/2017 21:25  Assessment / Plan: 1. Acute pulmonary embolus- July 2018. Unprovoked. Now on Coumadin. I would recommend continuing indefinitely. Will check INR today.  2.  Coronary disease status post 2 vessel CABG in August of 2009. She is asymptomatic. Cardiac cath in July 2017 showed patent grafts. We'll continue with her current medical therapy and risk factor modification. Myoview study normal April 2015.   3. Hyperlipidemia. Continue TriCor and Lipitor.   4. Diabetes mellitus type 2. Good control.   5. Chronic kidney disease stage III. ARB stopped due to hyperkalemia and elevated creatinine. Last creatinine 0.95  6. HTN. BP is well controlled.    Follow up in 6 months.

## 2017-11-29 ENCOUNTER — Encounter: Payer: Self-pay | Admitting: Cardiology

## 2017-11-29 ENCOUNTER — Ambulatory Visit (INDEPENDENT_AMBULATORY_CARE_PROVIDER_SITE_OTHER): Payer: Medicare Other | Admitting: Pharmacist Clinician (PhC)/ Clinical Pharmacy Specialist

## 2017-11-29 ENCOUNTER — Ambulatory Visit (INDEPENDENT_AMBULATORY_CARE_PROVIDER_SITE_OTHER): Payer: Medicare Other | Admitting: Cardiology

## 2017-11-29 VITALS — BP 153/74 | HR 64 | Ht 63.0 in | Wt 142.8 lb

## 2017-11-29 DIAGNOSIS — I10 Essential (primary) hypertension: Secondary | ICD-10-CM | POA: Diagnosis not present

## 2017-11-29 DIAGNOSIS — Z951 Presence of aortocoronary bypass graft: Secondary | ICD-10-CM

## 2017-11-29 DIAGNOSIS — I25708 Atherosclerosis of coronary artery bypass graft(s), unspecified, with other forms of angina pectoris: Secondary | ICD-10-CM

## 2017-11-29 DIAGNOSIS — I2699 Other pulmonary embolism without acute cor pulmonale: Secondary | ICD-10-CM | POA: Diagnosis not present

## 2017-11-29 DIAGNOSIS — Z7901 Long term (current) use of anticoagulants: Secondary | ICD-10-CM

## 2017-11-29 LAB — POCT INR: INR: 2.2

## 2017-11-29 NOTE — Patient Instructions (Signed)
Continue your current therapy  I will see you in 6 months.   

## 2017-12-19 ENCOUNTER — Telehealth: Payer: Self-pay | Admitting: *Deleted

## 2017-12-19 ENCOUNTER — Other Ambulatory Visit: Payer: Self-pay | Admitting: Pharmacist

## 2017-12-19 MED ORDER — WARFARIN SODIUM 3 MG PO TABS: ORAL_TABLET | ORAL | 1 refills | Status: DC

## 2017-12-19 NOTE — Telephone Encounter (Signed)
Refill sent to [facility] pharmacy

## 2017-12-29 ENCOUNTER — Ambulatory Visit (INDEPENDENT_AMBULATORY_CARE_PROVIDER_SITE_OTHER): Payer: Medicare Other | Admitting: Pharmacist Clinician (PhC)/ Clinical Pharmacy Specialist

## 2017-12-29 DIAGNOSIS — I2699 Other pulmonary embolism without acute cor pulmonale: Secondary | ICD-10-CM

## 2017-12-29 DIAGNOSIS — Z7901 Long term (current) use of anticoagulants: Secondary | ICD-10-CM

## 2017-12-29 LAB — POCT INR: INR: 2.3

## 2018-01-12 ENCOUNTER — Ambulatory Visit: Payer: Medicare Other | Admitting: Cardiology

## 2018-01-18 ENCOUNTER — Other Ambulatory Visit: Payer: Self-pay

## 2018-01-18 MED ORDER — METOPROLOL TARTRATE 25 MG PO TABS: 25.0000 mg | ORAL_TABLET | Freq: Two times a day (BID) | ORAL | 3 refills | Status: DC

## 2018-01-22 DIAGNOSIS — Z85828 Personal history of other malignant neoplasm of skin: Secondary | ICD-10-CM | POA: Diagnosis not present

## 2018-01-22 DIAGNOSIS — L905 Scar conditions and fibrosis of skin: Secondary | ICD-10-CM | POA: Diagnosis not present

## 2018-02-09 ENCOUNTER — Ambulatory Visit (INDEPENDENT_AMBULATORY_CARE_PROVIDER_SITE_OTHER): Payer: Medicare Other | Admitting: Pharmacist

## 2018-02-09 DIAGNOSIS — Z7901 Long term (current) use of anticoagulants: Secondary | ICD-10-CM

## 2018-02-09 DIAGNOSIS — I2699 Other pulmonary embolism without acute cor pulmonale: Secondary | ICD-10-CM | POA: Diagnosis not present

## 2018-02-09 LAB — POCT INR: INR: 3.8

## 2018-03-09 ENCOUNTER — Ambulatory Visit (INDEPENDENT_AMBULATORY_CARE_PROVIDER_SITE_OTHER): Payer: Medicare Other | Admitting: Pharmacist Clinician (PhC)/ Clinical Pharmacy Specialist

## 2018-03-09 DIAGNOSIS — Z7901 Long term (current) use of anticoagulants: Secondary | ICD-10-CM

## 2018-03-09 DIAGNOSIS — I2699 Other pulmonary embolism without acute cor pulmonale: Secondary | ICD-10-CM

## 2018-03-09 LAB — POCT INR: INR: 3.4 — AB (ref 2.0–3.0)

## 2018-03-23 ENCOUNTER — Ambulatory Visit (INDEPENDENT_AMBULATORY_CARE_PROVIDER_SITE_OTHER): Payer: Medicare Other | Admitting: Pharmacist

## 2018-03-23 DIAGNOSIS — I2699 Other pulmonary embolism without acute cor pulmonale: Secondary | ICD-10-CM

## 2018-03-23 DIAGNOSIS — Z7901 Long term (current) use of anticoagulants: Secondary | ICD-10-CM

## 2018-03-23 LAB — POCT INR: INR: 2.6 (ref 2.0–3.0)

## 2018-03-29 DIAGNOSIS — I10 Essential (primary) hypertension: Secondary | ICD-10-CM | POA: Diagnosis not present

## 2018-03-29 DIAGNOSIS — E7800 Pure hypercholesterolemia, unspecified: Secondary | ICD-10-CM | POA: Diagnosis not present

## 2018-03-29 DIAGNOSIS — E1165 Type 2 diabetes mellitus with hyperglycemia: Secondary | ICD-10-CM | POA: Diagnosis not present

## 2018-04-10 ENCOUNTER — Other Ambulatory Visit: Payer: Self-pay | Admitting: Pharmacist Clinician (PhC)/ Clinical Pharmacy Specialist

## 2018-04-10 ENCOUNTER — Ambulatory Visit (INDEPENDENT_AMBULATORY_CARE_PROVIDER_SITE_OTHER): Payer: Medicare Other | Admitting: Pharmacist Clinician (PhC)/ Clinical Pharmacy Specialist

## 2018-04-10 DIAGNOSIS — I2699 Other pulmonary embolism without acute cor pulmonale: Secondary | ICD-10-CM

## 2018-04-10 DIAGNOSIS — Z7901 Long term (current) use of anticoagulants: Secondary | ICD-10-CM

## 2018-04-10 MED ORDER — WARFARIN SODIUM 1 MG PO TABS: ORAL_TABLET | ORAL | 3 refills | Status: DC

## 2018-04-25 ENCOUNTER — Ambulatory Visit (INDEPENDENT_AMBULATORY_CARE_PROVIDER_SITE_OTHER): Payer: Medicare Other | Admitting: Pharmacist Clinician (PhC)/ Clinical Pharmacy Specialist

## 2018-04-25 DIAGNOSIS — Z7901 Long term (current) use of anticoagulants: Secondary | ICD-10-CM

## 2018-04-25 DIAGNOSIS — I2699 Other pulmonary embolism without acute cor pulmonale: Secondary | ICD-10-CM | POA: Diagnosis not present

## 2018-04-25 LAB — POCT INR: INR: 1.7 — AB (ref 2.0–3.0)

## 2018-05-11 ENCOUNTER — Ambulatory Visit (INDEPENDENT_AMBULATORY_CARE_PROVIDER_SITE_OTHER): Payer: Medicare Other | Admitting: Pharmacist Clinician (PhC)/ Clinical Pharmacy Specialist

## 2018-05-11 DIAGNOSIS — I2699 Other pulmonary embolism without acute cor pulmonale: Secondary | ICD-10-CM

## 2018-05-11 DIAGNOSIS — Z7901 Long term (current) use of anticoagulants: Secondary | ICD-10-CM

## 2018-05-11 LAB — POCT INR: INR: 1.8 — AB (ref 2.0–3.0)

## 2018-05-11 NOTE — Patient Instructions (Signed)
Description   Take 3 tablets today Wednesday July 26, then increase dose to 2 tablets each Monday, Wednesday and Friday, 1 tablet all other days.  Repeat INR in 2 weeks

## 2018-05-25 ENCOUNTER — Ambulatory Visit (INDEPENDENT_AMBULATORY_CARE_PROVIDER_SITE_OTHER): Payer: Medicare Other | Admitting: Pharmacist Clinician (PhC)/ Clinical Pharmacy Specialist

## 2018-05-25 DIAGNOSIS — Z7901 Long term (current) use of anticoagulants: Secondary | ICD-10-CM | POA: Diagnosis not present

## 2018-05-25 DIAGNOSIS — I2699 Other pulmonary embolism without acute cor pulmonale: Secondary | ICD-10-CM

## 2018-05-25 LAB — POCT INR: INR: 2.2 (ref 2.0–3.0)

## 2018-06-06 DIAGNOSIS — I10 Essential (primary) hypertension: Secondary | ICD-10-CM | POA: Diagnosis not present

## 2018-06-06 DIAGNOSIS — N189 Chronic kidney disease, unspecified: Secondary | ICD-10-CM | POA: Diagnosis not present

## 2018-06-06 DIAGNOSIS — E1165 Type 2 diabetes mellitus with hyperglycemia: Secondary | ICD-10-CM | POA: Diagnosis not present

## 2018-06-06 DIAGNOSIS — E7800 Pure hypercholesterolemia, unspecified: Secondary | ICD-10-CM | POA: Diagnosis not present

## 2018-06-22 ENCOUNTER — Ambulatory Visit (INDEPENDENT_AMBULATORY_CARE_PROVIDER_SITE_OTHER): Payer: Medicare Other | Admitting: Pharmacist Clinician (PhC)/ Clinical Pharmacy Specialist

## 2018-06-22 DIAGNOSIS — Z7901 Long term (current) use of anticoagulants: Secondary | ICD-10-CM | POA: Diagnosis not present

## 2018-06-22 DIAGNOSIS — I2699 Other pulmonary embolism without acute cor pulmonale: Secondary | ICD-10-CM | POA: Diagnosis not present

## 2018-06-22 LAB — POCT INR: INR: 3 (ref 2.0–3.0)

## 2018-07-12 ENCOUNTER — Other Ambulatory Visit: Payer: Self-pay | Admitting: Cardiology

## 2018-07-13 NOTE — Telephone Encounter (Signed)
Rx request sent to pharmacy.  

## 2018-07-20 ENCOUNTER — Ambulatory Visit (INDEPENDENT_AMBULATORY_CARE_PROVIDER_SITE_OTHER): Payer: Medicare Other | Admitting: Pharmacist

## 2018-07-20 DIAGNOSIS — Z7901 Long term (current) use of anticoagulants: Secondary | ICD-10-CM | POA: Diagnosis not present

## 2018-07-20 DIAGNOSIS — I2699 Other pulmonary embolism without acute cor pulmonale: Secondary | ICD-10-CM

## 2018-07-20 LAB — POCT INR: INR: 2.4 (ref 2.0–3.0)

## 2018-07-31 DIAGNOSIS — E1165 Type 2 diabetes mellitus with hyperglycemia: Secondary | ICD-10-CM | POA: Diagnosis not present

## 2018-07-31 DIAGNOSIS — N189 Chronic kidney disease, unspecified: Secondary | ICD-10-CM | POA: Diagnosis not present

## 2018-07-31 DIAGNOSIS — I10 Essential (primary) hypertension: Secondary | ICD-10-CM | POA: Diagnosis not present

## 2018-07-31 DIAGNOSIS — E7800 Pure hypercholesterolemia, unspecified: Secondary | ICD-10-CM | POA: Diagnosis not present

## 2018-08-07 DIAGNOSIS — E119 Type 2 diabetes mellitus without complications: Secondary | ICD-10-CM | POA: Diagnosis not present

## 2018-08-07 DIAGNOSIS — H35033 Hypertensive retinopathy, bilateral: Secondary | ICD-10-CM | POA: Diagnosis not present

## 2018-08-07 DIAGNOSIS — E113291 Type 2 diabetes mellitus with mild nonproliferative diabetic retinopathy without macular edema, right eye: Secondary | ICD-10-CM | POA: Diagnosis not present

## 2018-08-07 DIAGNOSIS — H3560 Retinal hemorrhage, unspecified eye: Secondary | ICD-10-CM | POA: Diagnosis not present

## 2018-08-16 ENCOUNTER — Ambulatory Visit (INDEPENDENT_AMBULATORY_CARE_PROVIDER_SITE_OTHER): Payer: Medicare Other | Admitting: Pharmacist Clinician (PhC)/ Clinical Pharmacy Specialist

## 2018-08-16 DIAGNOSIS — Z7901 Long term (current) use of anticoagulants: Secondary | ICD-10-CM | POA: Diagnosis not present

## 2018-08-16 DIAGNOSIS — I2699 Other pulmonary embolism without acute cor pulmonale: Secondary | ICD-10-CM

## 2018-08-16 LAB — POCT INR: INR: 1.9 — AB (ref 2.0–3.0)

## 2018-08-16 NOTE — Patient Instructions (Signed)
Description   Continue with 2 tablets each Monday, Wednesday and Friday, 1 tablet all other days.  Repeat INR in 4 weeks.  Call with any concerns/questions [phone]

## 2018-09-06 ENCOUNTER — Telehealth: Payer: Self-pay | Admitting: Cardiology

## 2018-09-06 ENCOUNTER — Other Ambulatory Visit: Payer: Self-pay | Admitting: Pharmacist Clinician (PhC)/ Clinical Pharmacy Specialist

## 2018-09-06 MED ORDER — WARFARIN SODIUM 1 MG PO TABS
ORAL_TABLET | ORAL | 3 refills | Status: DC
Start: 2018-09-06 — End: 2018-09-06

## 2018-09-06 MED ORDER — WARFARIN SODIUM 1 MG PO TABS: ORAL_TABLET | ORAL | 3 refills | Status: DC

## 2018-09-06 NOTE — Addendum Note (Signed)
Addended by: Harrington Challenger on: 09/06/2018 12:38 PM   Modules accepted: Orders

## 2018-09-06 NOTE — Telephone Encounter (Signed)
Error

## 2018-09-06 NOTE — Telephone Encounter (Signed)
FORWARD TO [facility]--ANTICOAG

## 2018-09-06 NOTE — Telephone Encounter (Signed)
New Message           *STAT* If patient is at the pharmacy, call can be transferred to refill team.   1. Which medications need to be refilled? (please list name of each medication and dose if known) Warfin 1 mg  2. Which pharmacy/location (including street and city if local pharmacy) is medication to be sent to? [place], [address] [phone] (Phone) [phone] (Fax)     3. Do they need a 30 day or 90 day supply?[place]

## 2018-09-18 ENCOUNTER — Ambulatory Visit: Payer: Self-pay

## 2018-09-18 NOTE — Progress Notes (Signed)
Scheduled overdue inr 

## 2018-09-19 ENCOUNTER — Ambulatory Visit (INDEPENDENT_AMBULATORY_CARE_PROVIDER_SITE_OTHER): Payer: Medicare Other | Admitting: Pharmacist

## 2018-09-19 DIAGNOSIS — Z7901 Long term (current) use of anticoagulants: Secondary | ICD-10-CM

## 2018-09-19 DIAGNOSIS — I2699 Other pulmonary embolism without acute cor pulmonale: Secondary | ICD-10-CM

## 2018-09-19 LAB — POCT INR: INR: 1.7 — AB (ref 2.0–3.0)

## 2018-10-02 DIAGNOSIS — R300 Dysuria: Secondary | ICD-10-CM | POA: Diagnosis not present

## 2018-10-02 DIAGNOSIS — N10 Acute tubulo-interstitial nephritis: Secondary | ICD-10-CM | POA: Diagnosis not present

## 2018-10-02 DIAGNOSIS — N390 Urinary tract infection, site not specified: Secondary | ICD-10-CM | POA: Diagnosis not present

## 2018-10-03 DIAGNOSIS — N10 Acute tubulo-interstitial nephritis: Secondary | ICD-10-CM | POA: Diagnosis not present

## 2018-10-13 ENCOUNTER — Other Ambulatory Visit: Payer: Self-pay | Admitting: Cardiology

## 2018-10-19 ENCOUNTER — Ambulatory Visit (INDEPENDENT_AMBULATORY_CARE_PROVIDER_SITE_OTHER): Payer: Medicare Other | Admitting: Pharmacist

## 2018-10-19 DIAGNOSIS — Z7901 Long term (current) use of anticoagulants: Secondary | ICD-10-CM | POA: Diagnosis not present

## 2018-10-19 DIAGNOSIS — I2699 Other pulmonary embolism without acute cor pulmonale: Secondary | ICD-10-CM

## 2018-10-19 LAB — POCT INR: INR: 2.2 (ref 2.0–3.0)

## 2018-10-19 MED ORDER — METOPROLOL TARTRATE 25 MG PO TABS: 25.0000 mg | ORAL_TABLET | Freq: Two times a day (BID) | ORAL | 3 refills | Status: DC

## 2018-10-19 MED ORDER — WARFARIN SODIUM 1 MG PO TABS: ORAL_TABLET | ORAL | 3 refills | Status: DC

## 2018-10-19 MED ORDER — FENOFIBRATE 145 MG PO TABS: 145.0000 mg | ORAL_TABLET | Freq: Every evening | ORAL | 2 refills | Status: DC

## 2018-10-19 MED ORDER — ATORVASTATIN CALCIUM 20 MG PO TABS: 20.0000 mg | ORAL_TABLET | Freq: Every day | ORAL | 1 refills | Status: DC

## 2018-12-03 ENCOUNTER — Ambulatory Visit (INDEPENDENT_AMBULATORY_CARE_PROVIDER_SITE_OTHER): Payer: Medicare Other | Admitting: *Deleted

## 2018-12-03 DIAGNOSIS — I2699 Other pulmonary embolism without acute cor pulmonale: Secondary | ICD-10-CM | POA: Diagnosis not present

## 2018-12-03 DIAGNOSIS — Z7901 Long term (current) use of anticoagulants: Secondary | ICD-10-CM | POA: Diagnosis not present

## 2018-12-03 LAB — POCT INR: INR: 2.8 (ref 2.0–3.0)

## 2019-01-11 ENCOUNTER — Telehealth: Payer: Self-pay

## 2019-01-11 NOTE — Telephone Encounter (Signed)

## 2019-01-14 ENCOUNTER — Ambulatory Visit (INDEPENDENT_AMBULATORY_CARE_PROVIDER_SITE_OTHER): Payer: Medicare Other | Admitting: *Deleted

## 2019-01-14 DIAGNOSIS — Z7901 Long term (current) use of anticoagulants: Secondary | ICD-10-CM

## 2019-01-14 DIAGNOSIS — I2699 Other pulmonary embolism without acute cor pulmonale: Secondary | ICD-10-CM | POA: Diagnosis not present

## 2019-01-14 LAB — POCT INR: INR: 3.2 — AB (ref 2.0–3.0)

## 2019-02-15 ENCOUNTER — Telehealth: Payer: Self-pay

## 2019-02-15 NOTE — Telephone Encounter (Signed)

## 2019-02-18 ENCOUNTER — Other Ambulatory Visit: Payer: Self-pay

## 2019-02-18 ENCOUNTER — Ambulatory Visit (INDEPENDENT_AMBULATORY_CARE_PROVIDER_SITE_OTHER): Payer: Medicare Other | Admitting: Pharmacist

## 2019-02-18 DIAGNOSIS — Z7901 Long term (current) use of anticoagulants: Secondary | ICD-10-CM

## 2019-02-18 DIAGNOSIS — I2699 Other pulmonary embolism without acute cor pulmonale: Secondary | ICD-10-CM

## 2019-02-18 LAB — POCT INR: INR: 3.4 — AB (ref 2.0–3.0)

## 2019-03-08 ENCOUNTER — Telehealth: Payer: Self-pay

## 2019-03-08 NOTE — Telephone Encounter (Signed)

## 2019-03-12 ENCOUNTER — Ambulatory Visit (INDEPENDENT_AMBULATORY_CARE_PROVIDER_SITE_OTHER): Payer: Medicare Other | Admitting: *Deleted

## 2019-03-12 ENCOUNTER — Other Ambulatory Visit: Payer: Self-pay

## 2019-03-12 DIAGNOSIS — I2699 Other pulmonary embolism without acute cor pulmonale: Secondary | ICD-10-CM

## 2019-03-12 DIAGNOSIS — Z7901 Long term (current) use of anticoagulants: Secondary | ICD-10-CM | POA: Diagnosis not present

## 2019-03-12 LAB — POCT INR: INR: 1.7 — AB (ref 2.0–3.0)

## 2019-03-12 NOTE — Patient Instructions (Addendum)
Description   Spoke with pt and instructed to take 2 tablets today then take the dose we instructed her to take, which is 1 tablet daily except 2 tablets on Mondays and Fridays. Recheck in 3 weeks.  Call with any concerns/questions [phone]

## 2019-03-27 ENCOUNTER — Telehealth: Payer: Self-pay

## 2019-03-27 NOTE — Telephone Encounter (Signed)

## 2019-04-03 ENCOUNTER — Ambulatory Visit (INDEPENDENT_AMBULATORY_CARE_PROVIDER_SITE_OTHER): Payer: Medicare Other | Admitting: Pharmacist

## 2019-04-03 ENCOUNTER — Other Ambulatory Visit: Payer: Self-pay

## 2019-04-03 DIAGNOSIS — I2699 Other pulmonary embolism without acute cor pulmonale: Secondary | ICD-10-CM | POA: Diagnosis not present

## 2019-04-03 DIAGNOSIS — Z7901 Long term (current) use of anticoagulants: Secondary | ICD-10-CM | POA: Diagnosis not present

## 2019-04-03 LAB — POCT INR: INR: 2.2 (ref 2.0–3.0)

## 2019-04-15 ENCOUNTER — Other Ambulatory Visit: Payer: Self-pay | Admitting: Cardiology

## 2019-04-15 NOTE — Telephone Encounter (Signed)
Rx(s) sent to pharmacy electronically.  

## 2019-04-30 ENCOUNTER — Telehealth: Payer: Self-pay

## 2019-04-30 NOTE — Telephone Encounter (Signed)

## 2019-05-03 ENCOUNTER — Encounter (INDEPENDENT_AMBULATORY_CARE_PROVIDER_SITE_OTHER): Payer: Self-pay

## 2019-05-03 ENCOUNTER — Ambulatory Visit (INDEPENDENT_AMBULATORY_CARE_PROVIDER_SITE_OTHER): Payer: Medicare Other | Admitting: *Deleted

## 2019-05-03 ENCOUNTER — Other Ambulatory Visit: Payer: Self-pay

## 2019-05-03 DIAGNOSIS — I2699 Other pulmonary embolism without acute cor pulmonale: Secondary | ICD-10-CM

## 2019-05-03 DIAGNOSIS — Z7901 Long term (current) use of anticoagulants: Secondary | ICD-10-CM | POA: Diagnosis not present

## 2019-05-03 LAB — POCT INR: INR: 1.7 — AB (ref 2.0–3.0)

## 2019-05-03 NOTE — Patient Instructions (Signed)
Description   Today take 3 tablets, then resume taking 1 tablet everyday except 2 tablets on Fridays. Call us with any questions [phone]

## 2019-05-16 ENCOUNTER — Other Ambulatory Visit: Payer: Self-pay | Admitting: Cardiology

## 2019-05-17 ENCOUNTER — Other Ambulatory Visit: Payer: Self-pay

## 2019-05-17 ENCOUNTER — Ambulatory Visit (INDEPENDENT_AMBULATORY_CARE_PROVIDER_SITE_OTHER): Payer: Medicare Other | Admitting: Pharmacist Clinician (PhC)/ Clinical Pharmacy Specialist

## 2019-05-17 DIAGNOSIS — I2699 Other pulmonary embolism without acute cor pulmonale: Secondary | ICD-10-CM

## 2019-05-17 DIAGNOSIS — Z7901 Long term (current) use of anticoagulants: Secondary | ICD-10-CM | POA: Diagnosis not present

## 2019-05-17 LAB — POCT INR: INR: 2.1 (ref 2.0–3.0)

## 2019-06-14 ENCOUNTER — Other Ambulatory Visit: Payer: Self-pay

## 2019-06-14 ENCOUNTER — Ambulatory Visit (INDEPENDENT_AMBULATORY_CARE_PROVIDER_SITE_OTHER): Payer: Medicare Other | Admitting: *Deleted

## 2019-06-14 ENCOUNTER — Other Ambulatory Visit: Payer: Self-pay | Admitting: Cardiology

## 2019-06-14 DIAGNOSIS — Z7901 Long term (current) use of anticoagulants: Secondary | ICD-10-CM | POA: Diagnosis not present

## 2019-06-14 DIAGNOSIS — I2699 Other pulmonary embolism without acute cor pulmonale: Secondary | ICD-10-CM | POA: Diagnosis not present

## 2019-06-14 LAB — POCT INR: INR: 2.4 (ref 2.0–3.0)

## 2019-06-14 NOTE — Patient Instructions (Signed)
Description   Continue with 1 tablet everyday except 2 tablets on Fridays. Call us with any questions [phone].  Repeat INR in 4 weeks

## 2019-06-19 ENCOUNTER — Other Ambulatory Visit: Payer: Self-pay | Admitting: Cardiology

## 2019-06-19 MED ORDER — OMEPRAZOLE 20 MG PO CPDR: 20.0000 mg | DELAYED_RELEASE_CAPSULE | Freq: Every day | ORAL | 0 refills | Status: DC

## 2019-06-19 NOTE — Telephone Encounter (Signed)
°*  STAT* If patient is at the pharmacy, call can be transferred to refill team.   1. Which medications need to be refilled? (please list name of each medication and dose if known) new prescription for Omeprazole  2. Which pharmacy/location (including street and city if local pharmacy) is medication to be sent to? [facility]- [phone]  3. Do they need a 30 day or 90 day supply? 30 day and refills

## 2019-06-19 NOTE — Telephone Encounter (Signed)
Please schedule patient for overdue follow-up with Dr. [place]. Thank you!

## 2019-07-12 ENCOUNTER — Other Ambulatory Visit: Payer: Self-pay

## 2019-07-12 ENCOUNTER — Ambulatory Visit (INDEPENDENT_AMBULATORY_CARE_PROVIDER_SITE_OTHER): Payer: Medicare Other | Admitting: Pharmacist

## 2019-07-12 DIAGNOSIS — I2699 Other pulmonary embolism without acute cor pulmonale: Secondary | ICD-10-CM | POA: Diagnosis not present

## 2019-07-12 DIAGNOSIS — Z7901 Long term (current) use of anticoagulants: Secondary | ICD-10-CM | POA: Diagnosis not present

## 2019-07-12 LAB — POCT INR: INR: 2.3 (ref 2.0–3.0)

## 2019-07-21 NOTE — Progress Notes (Deleted)
Cardiology Office Note   Date:  07/21/2019   ID:  Jennifer Warner, DOB 09-Sep-1934, MRN [id number]  PCP:  Merrilee Seashore, MD  Cardiologist:  No primary care provider on file. EP: None  No chief complaint on file.     History of Present Illness: Jennifer Warner is a 85 y.o. female with a PMH of CAD s/p CABG in 2009, HTN, HLD, DM type 2, PE on coumadin, who presents for ***  She was last evaluated by cardiology at an outpatient visit with Dr. Martinique 11/2017, at which time she was doing well from a cardiac standpoint. No medication changes occurred and she was recommended to follow-u pin 6 months, however has not been seen since that time. Her last ischemic evaluation was a LHC in 2017 to evaluate chest pain which revealed patent grafts, high grade obstruction in the mLCx and 70% PDA stenosis that was medically managed. Her last echocardiogram in 2018 showed EF 55-60%, elevated LA filling pressures, severely increased PA pressures, and mildly reduced RV systolic function (in the setting of an acute PE).  1. CAD s/p CABG: no anginal complaints. Not on aspirin given need for anticoagulation - Continue statin and metoprolol  2. HTN: BP *** today - Continue metoprolol  3. HLD: LDL 58 on lipids in 2018; triglycerides 281 - Continue statin and fenofibrate  4. History of PE: unprovoked. Recommended for lifelong anticoagulation. INR followed at the coumadin clinic with last check 07/12/2019 with INR 2.3 - Continue coumadin  Past Medical History:  Diagnosis Date  . Acute thyroiditis   . CAD (coronary artery disease)   . CAP (community acquired pneumonia) 12/2012  . Essential hypertension, benign   . GERD (gastroesophageal reflux disease)   . History of blood transfusion 05/2008   "when I had my bypass"  . Hyperkalemia   . Hypertriglyceridemia   . Other secondary thrombocytopenia   . Polycystic ovaries   . Postsurgical aortocoronary bypass status   . Pure hypercholesterolemia    . Type II diabetes mellitus ([place])   . Urinary tract infection, site not specified     Past Surgical History:  Procedure Laterality Date  . CARDIAC CATHETERIZATION  05/2008  . CARDIAC CATHETERIZATION N/A 05/03/2016   Procedure: Left Heart Cath and Coronary Angiography;  Surgeon: Belva Crome, MD;  Location: [facility];  Service: Cardiovascular;  Laterality: N/A;  . CATARACT EXTRACTION W/ INTRAOCULAR LENS  IMPLANT, BILATERAL Bilateral ~ 2000  . [place]  . CORONARY ARTERY BYPASS GRAFT  05/2008   X2, [place] to LAD, SVG to PDA  . DILATION AND CURETTAGE OF UTERUS    . LAPAROSCOPIC CHOLECYSTECTOMY  1990s  . TUBAL LIGATION       Current Outpatient Medications  Medication Sig Dispense Refill  . atorvastatin (LIPITOR) 20 MG tablet Take 1 tablet (20 mg total) by mouth daily at 6 PM. 90 tablet 1  . diphenhydramine-acetaminophen (TYLENOL PM) 25-500 MG TABS Take 2 tablets by mouth at bedtime.    . fenofibrate (TRICOR) 145 MG tablet Take 1 tablet (145 mg total) by mouth every evening. 90 tablet 2  . LANTUS SOLOSTAR 100 UNIT/ML injection Inject 25 Units into the skin at bedtime.     . metoprolol tartrate (LOPRESSOR) 25 MG tablet Take 1 tablet (25 mg total) by mouth 2 (two) times daily. 180 tablet 3  . nitroGLYCERIN (NITROSTAT) 0.4 MG SL tablet Place 1 tablet (0.4 mg total) under the tongue every 5 (five) minutes  x 3 doses as needed for chest pain. 25 tablet 2  . omeprazole (PRILOSEC) 20 MG capsule Take 1 capsule (20 mg total) by mouth daily. *NEEDS OFFICE VISIT* 30 capsule 0  . Vitamin D, Ergocalciferol, (DRISDOL) 50000 units CAPS capsule Take 50,000 Units by mouth 2 (two) times a week.  1  . warfarin (COUMADIN) 1 MG tablet Take 1-2 tablets by mouth daily as directed by coumadin clinic 50 tablet 3   No current facility-administered medications for this visit.     Allergies:   Levofloxacin    Social History:  The patient  reports that she has never smoked. She has never used  smokeless tobacco. She reports that she does not drink alcohol or use drugs.   Family History:  The patient's ***family history includes Cancer in her brother; Liver disease (age of onset: 74) in her mother; Stroke in her brother.    ROS:  Please see the history of present illness.   Otherwise, review of systems are positive for {NONE DEFAULTED:18576::"none"}.   All other systems are reviewed and negative.    PHYSICAL EXAM: VS:  There were no vitals taken for this visit. , BMI There is no height or weight on file to calculate BMI. GEN: Well nourished, well developed, in no acute distress HEENT: normal Neck: no JVD, carotid bruits, or masses Cardiac: ***RRR; no murmurs, rubs, or gallops,no edema  Respiratory:  clear to auscultation bilaterally, normal work of breathing GI: soft, nontender, nondistended, + BS MS: no deformity or atrophy Skin: warm and dry, no rash Neuro:  Strength and sensation are intact Psych: euthymic mood, full affect   EKG:  EKG {ACTION; IS/IS [id number] ordered today. The ekg ordered today demonstrates ***   Recent Labs: No results found for requested labs within last 8760 hours.    Lipid Panel    Component Value Date/Time   CHOL 142 08/15/2017 0401   TRIG 281 (H) 08/15/2017 0401   HDL 28 (L) 08/15/2017 0401   CHOLHDL 5.1 08/15/2017 0401   VLDL 56 (H) 08/15/2017 0401   LDLCALC 58 08/15/2017 0401      Wt Readings from Last 3 Encounters:  11/29/17 142 lb 12.8 oz (64.8 kg)  08/29/17 139 lb 6.4 oz (63.2 kg)  08/17/17 140 lb 8 oz (63.7 kg)      Other studies Reviewed: Additional studies/ records that were reviewed today include:   Echocardiogram 2018: Study Conclusions  - Left ventricle: The cavity size was normal. Wall thickness was   normal. Systolic function was normal. The estimated ejection   fraction was in the range of 55% to 60%. Wall motion was normal;   there were no regional wall motion abnormalities. Doppler   parameters are  consistent with elevated mean left atrial filling   pressure. - Right ventricle: The cavity size was moderately dilated. Systolic   function was mildly reduced. - Right atrium: The atrium was mildly dilated. - Tricuspid valve: There was moderate regurgitation. - Pulmonary arteries: Systolic pressure was severely increased. PA   peak pressure: 77 mm Hg (S).  Left heart catheterization 2017: 1. Prox RCA to Mid RCA lesion, 70% stenosed. 2. Mid Cx lesion, 80% stenosed. 3. Ost LPDA to LPDA lesion, 70% stenosed. 4. SVG was injected . 5. was injected is normal in caliber, and is anatomically normal. 6. Prox LAD to Mid LAD lesion, 100% stenosed. 7. Ost LM to LM lesion, 70% stenosed.    Prolonged procedure, caused by failed attempt at left radial approach  due to radial loop and the development of a microperforation due to diagnostic catheter/guidewire injury. Hemostasis was achieved with compression and an Ace bandage.  Saphenous vein graft to the third obtuse marginal is widely patent. The procedure was prolonged because the graft was not marked and required aortography to identify the site of origin.  Widely patent LIMA to LAD.  Nondominant moderately diseased, 70% mid right coronary artery.  Total occlusion of the proximal native LAD.  High-grade obstruction of the mid circumflex between the large first and third obtuse marginal branch. There is also 70% stenosis in the PDA.  Normal left ventricular function and hemodynamics.  Atrial fibrillation developed transiently, lasting approximally 3 minutes. Atrial fibrillation was incited by mechanical irritation during left ventriculography.   RECOMMENDATIONS:   Watch medial left arm for evidence of bleeding/hematoma.  Monitor femoral site for evidence of bleeding.  Heparin can probably be discontinued and converted to DVT prophylaxis.    ASSESSMENT AND PLAN:  1.  ***   Current medicines are reviewed at length with the  patient today.  The patient {ACTIONS; HAS/DOES NOT HAVE:19233} concerns regarding medicines.  The following changes have been made:  {PLAN; NO CHANGE:13088:s}  Labs/ tests ordered today include: *** No orders of the defined types were placed in this encounter.    Disposition:   FU with *** in {gen number [id number] {Days to years:10300}  Signed, Abigail Butts, PA-C  07/21/2019 9:50 PM

## 2019-07-22 ENCOUNTER — Ambulatory Visit: Payer: Medicare Other | Admitting: Medical

## 2019-07-22 DIAGNOSIS — N390 Urinary tract infection, site not specified: Secondary | ICD-10-CM | POA: Diagnosis not present

## 2019-07-30 DIAGNOSIS — E1165 Type 2 diabetes mellitus with hyperglycemia: Secondary | ICD-10-CM | POA: Diagnosis not present

## 2019-07-30 DIAGNOSIS — E559 Vitamin D deficiency, unspecified: Secondary | ICD-10-CM | POA: Diagnosis not present

## 2019-08-02 ENCOUNTER — Other Ambulatory Visit: Payer: Self-pay | Admitting: Cardiology

## 2019-08-02 MED ORDER — FENOFIBRATE 145 MG PO TABS: 145.0000 mg | ORAL_TABLET | Freq: Every evening | ORAL | 0 refills | Status: DC

## 2019-08-02 MED ORDER — OMEPRAZOLE 20 MG PO CPDR: 20.0000 mg | DELAYED_RELEASE_CAPSULE | Freq: Every day | ORAL | 0 refills | Status: DC

## 2019-08-02 NOTE — Telephone Encounter (Signed)
Let patient know that I refilled the medications that she requested to be refilled.

## 2019-08-02 NOTE — Telephone Encounter (Signed)
New Message   *STAT* If patient is at the pharmacy, call can be transferred to refill team.   1. Which medications need to be refilled? (please list name of each medication and dose if known) fenofibrate (TRICOR) 145 MG tablet  omeprazole (PRILOSEC) 20 MG capsule  2. Which pharmacy/location (including street and city if local pharmacy) is medication to be sent to? [place] [facility] [address]  3. Do they need a 30 day or 90 day supply? 90 day    Patient is out of the medications.

## 2019-08-02 NOTE — Addendum Note (Signed)
Addended by: Venetia Maxon on: 08/02/2019 04:29 PM   Modules accepted: Orders

## 2019-08-05 ENCOUNTER — Other Ambulatory Visit: Payer: Self-pay

## 2019-08-05 MED ORDER — FENOFIBRATE 145 MG PO TABS: 145.0000 mg | ORAL_TABLET | Freq: Every evening | ORAL | 0 refills | Status: DC

## 2019-08-06 DIAGNOSIS — Z794 Long term (current) use of insulin: Secondary | ICD-10-CM | POA: Diagnosis not present

## 2019-08-06 DIAGNOSIS — N189 Chronic kidney disease, unspecified: Secondary | ICD-10-CM | POA: Diagnosis not present

## 2019-08-06 DIAGNOSIS — I10 Essential (primary) hypertension: Secondary | ICD-10-CM | POA: Diagnosis not present

## 2019-08-06 DIAGNOSIS — E1165 Type 2 diabetes mellitus with hyperglycemia: Secondary | ICD-10-CM | POA: Diagnosis not present

## 2019-08-06 DIAGNOSIS — E7800 Pure hypercholesterolemia, unspecified: Secondary | ICD-10-CM | POA: Diagnosis not present

## 2019-08-06 DIAGNOSIS — E559 Vitamin D deficiency, unspecified: Secondary | ICD-10-CM | POA: Diagnosis not present

## 2019-08-07 ENCOUNTER — Ambulatory Visit (INDEPENDENT_AMBULATORY_CARE_PROVIDER_SITE_OTHER): Payer: Medicare Other | Admitting: Pharmacist

## 2019-08-07 ENCOUNTER — Ambulatory Visit (INDEPENDENT_AMBULATORY_CARE_PROVIDER_SITE_OTHER): Payer: Medicare Other | Admitting: Medical

## 2019-08-07 ENCOUNTER — Other Ambulatory Visit: Payer: Self-pay

## 2019-08-07 ENCOUNTER — Encounter: Payer: Self-pay | Admitting: Medical

## 2019-08-07 VITALS — BP 120/70 | HR 77 | Ht 63.0 in | Wt 136.0 lb

## 2019-08-07 DIAGNOSIS — N1831 Chronic kidney disease, stage 3a: Secondary | ICD-10-CM | POA: Diagnosis not present

## 2019-08-07 DIAGNOSIS — Z7901 Long term (current) use of anticoagulants: Secondary | ICD-10-CM

## 2019-08-07 DIAGNOSIS — I2699 Other pulmonary embolism without acute cor pulmonale: Secondary | ICD-10-CM | POA: Diagnosis not present

## 2019-08-07 DIAGNOSIS — Z951 Presence of aortocoronary bypass graft: Secondary | ICD-10-CM

## 2019-08-07 DIAGNOSIS — E781 Pure hyperglyceridemia: Secondary | ICD-10-CM

## 2019-08-07 DIAGNOSIS — I10 Essential (primary) hypertension: Secondary | ICD-10-CM

## 2019-08-07 DIAGNOSIS — E785 Hyperlipidemia, unspecified: Secondary | ICD-10-CM

## 2019-08-07 DIAGNOSIS — I25708 Atherosclerosis of coronary artery bypass graft(s), unspecified, with other forms of angina pectoris: Secondary | ICD-10-CM

## 2019-08-07 LAB — POCT INR: INR: 2.1 (ref 2.0–3.0)

## 2019-08-07 MED ORDER — OMEPRAZOLE 20 MG PO CPDR: 20.0000 mg | DELAYED_RELEASE_CAPSULE | Freq: Every day | ORAL | 0 refills | Status: DC

## 2019-08-07 MED ORDER — NITROGLYCERIN 0.4 MG SL SUBL: 0.4000 mg | SUBLINGUAL_TABLET | SUBLINGUAL | 2 refills | Status: DC | PRN

## 2019-08-07 NOTE — Progress Notes (Signed)
Cardiology Office Note   Date:  08/07/2019   ID:  Jennifer Warner, Jennifer Warner 1934-05-16, MRN [id number]  PCP:  Merrilee Seashore, MD  Cardiologist:  Peter Martinique, MD EP: None  Chief Complaint  Patient presents with   Follow-up    CAD      History of Present Illness: Jennifer Warner is a 85 y.o. female with a PMH of CAD s/p CABG in 2009 (LIMA to LAD, SVG to RCA), HTN, HLD, DM type 2 on insulin, PE on coumadin, who presents for routine follow-up of her CAD.  She was last evaluated by cardiology at an outpatient visit with Dr. Martinique 2017/09/28, at which time she was doing well from a cardiac standpoint after a recent admission to [facility] for submassive PE with right heart strain though not a candidate for thrombectomy/thombolysis, for which she was started on coumadin. Her aspirin was discontinued at that time to minimize bleeding risk. No other medication changes occurred at that visit. Her last ischemic evaluation was a LHC in 2017 which showed patent LIMA to LAD and SVG to OM, occluded LAD, moderate RCA disease, high grade obstruction of mLCx between 1st-3rd OM branch, and moderate PDA disease which was medically managed. Her last echo was 07/2017 which showed EF 55-60%, no RWMA, mild RV systolic dysfunction and severely increased PA pressures (in the setting of PE), and moderate TR.   She presents today for follow-up of her CAD. She reports her husband died last 09/29/23 and she has since moved in with her sister near [place], [place]. She reports she is finally starting to feel like her self again mentally/physically after grieving the loss of her husband. Overall she has been doing well. She reports rare occurrences of chest pressure when she is doing strenuous activity which resolves with rest. She misplaced her SL nitro in the move but has not felt like she needed it. No complaints of DOE, though reports SOB when having to wear a mask. She has occasional lightheadedness when  changing positions quickly but no syncopal episodes. We discussed orthostatic precautions. No complaints of LE edema, palpitations, dizziness, or bleeding.    Past Medical History:  Diagnosis Date   Acute thyroiditis    CAD (coronary artery disease)    CAP (community acquired pneumonia) 12/2012   Essential hypertension, benign    GERD (gastroesophageal reflux disease)    History of blood transfusion 05/2008   "when I had my bypass"   Hyperkalemia    Hypertriglyceridemia    Other secondary thrombocytopenia    Polycystic ovaries    Postsurgical aortocoronary bypass status    Pure hypercholesterolemia    Type II diabetes mellitus ([facility])    Urinary tract infection, site not specified     Past Surgical History:  Procedure Laterality Date   CARDIAC CATHETERIZATION  05/2008   CARDIAC CATHETERIZATION N/A 05/03/2016   Procedure: Left Heart Cath and Coronary Angiography;  Surgeon: Belva Crome, MD;  Location: [facility];  Service: Cardiovascular;  Laterality: N/A;   CATARACT EXTRACTION W/ INTRAOCULAR LENS  IMPLANT, BILATERAL Bilateral ~ [place]   CORONARY ARTERY BYPASS GRAFT  05/2008   X2, [place] to LAD, SVG to PDA   DILATION AND CURETTAGE OF UTERUS     LAPAROSCOPIC CHOLECYSTECTOMY  1990s   TUBAL LIGATION       Current Outpatient Medications  Medication Sig Dispense Refill   atorvastatin (LIPITOR) 20 MG tablet Take 1 tablet (20 mg  total) by mouth daily at 6 PM. 90 tablet 1   diphenhydramine-acetaminophen (TYLENOL PM) 25-500 MG TABS Take 2 tablets by mouth at bedtime.     fenofibrate (TRICOR) 145 MG tablet Take 1 tablet (145 mg total) by mouth every evening. 90 tablet 0   LANTUS SOLOSTAR 100 UNIT/ML injection Inject 20 Units into the skin at bedtime.      metoprolol tartrate (LOPRESSOR) 25 MG tablet Take 1 tablet (25 mg total) by mouth 2 (two) times daily. 180 tablet 3   nitroGLYCERIN (NITROSTAT) 0.4 MG SL tablet Place 1 tablet (0.4  mg total) under the tongue every 5 (five) minutes x 3 doses as needed for chest pain. 25 tablet 2   omeprazole (PRILOSEC) 20 MG capsule Take 1 capsule (20 mg total) by mouth daily. 90 capsule 0   Vitamin D, Ergocalciferol, (DRISDOL) 50000 units CAPS capsule Take 50,000 Units by mouth 2 (two) times a week.  1   warfarin (COUMADIN) 1 MG tablet Take 1-2 tablets by mouth daily as directed by coumadin clinic 50 tablet 3   No current facility-administered medications for this visit.     Allergies:   Levofloxacin    Social History:  The patient  reports that she has never smoked. She has never used smokeless tobacco. She reports that she does not drink alcohol or use drugs.   Family History:  The patient's family history includes Cancer in her brother; Liver disease (age of onset: 48) in her mother; Stroke in her brother.    ROS:  Please see the history of present illness.   Otherwise, review of systems are positive for none.   All other systems are reviewed and negative.    PHYSICAL EXAM: VS:  BP 120/70    Pulse 77    Ht 5\' 3"  (1.6 m)    Wt 136 lb (61.7 kg)    SpO2 97%    BMI 24.09 kg/m  , BMI Body mass index is 24.09 kg/m. GEN: Well nourished, well developed, in no acute distress HEENT: sclera anicteric Neck: no JVD, carotid bruits, or masses Cardiac: RRR; no murmurs, rubs, or gallops, no edema  Respiratory:  clear to auscultation bilaterally, normal work of breathing GI: soft, nontender, nondistended, + BS MS: no deformity or atrophy Skin: warm and dry, no rash Neuro:  Strength and sensation are intact Psych: euthymic mood, full affect   EKG:  EKG is ordered today. The ekg ordered today demonstrates NSR, rate 77 bpm, QTc 441, isolated TWI in V2, no STE/D   Recent Labs: No results found for requested labs within last 8760 hours.    Lipid Panel    Component Value Date/Time   CHOL 142 08/15/2017 0401   TRIG 281 (H) 08/15/2017 0401   HDL 28 (L) 08/15/2017 0401   CHOLHDL  5.1 08/15/2017 0401   VLDL 56 (H) 08/15/2017 0401   LDLCALC 58 08/15/2017 0401      Wt Readings from Last 3 Encounters:  08/07/19 136 lb (61.7 kg)  11/29/17 142 lb 12.8 oz (64.8 kg)  08/29/17 139 lb 6.4 oz (63.2 kg)      Other studies Reviewed: Additional studies/ records that were reviewed today include:   Echocardiogram 07/2017 Study Conclusions  - Left ventricle: The cavity size was normal. Wall thickness was   normal. Systolic function was normal. The estimated ejection   fraction was in the range of 55% to 60%. Wall motion was normal;   there were no regional wall motion abnormalities. Doppler  parameters are consistent with elevated mean left atrial filling   pressure. - Right ventricle: The cavity size was moderately dilated. Systolic   function was mildly reduced. - Right atrium: The atrium was mildly dilated. - Tricuspid valve: There was moderate regurgitation. - Pulmonary arteries: Systolic pressure was severely increased. PA   peak pressure: 77 mm Hg (S).  Left heart catheterization 2017: 1. Prox RCA to Mid RCA lesion, 70% stenosed. 2. Mid Cx lesion, 80% stenosed. 3. Ost LPDA to LPDA lesion, 70% stenosed. 4. SVG was injected . 5. was injected is normal in caliber, and is anatomically normal. 6. Prox LAD to Mid LAD lesion, 100% stenosed. 7. Ost LM to LM lesion, 70% stenosed.    Prolonged procedure, caused by failed attempt at left radial approach due to radial loop and the development of a microperforation due to diagnostic catheter/guidewire injury. Hemostasis was achieved with compression and an Ace bandage.  Saphenous vein graft to the third obtuse marginal is widely patent. The procedure was prolonged because the graft was not marked and required aortography to identify the site of origin.  Widely patent LIMA to LAD.  Nondominant moderately diseased, 70% mid right coronary artery.  Total occlusion of the proximal native LAD.  High-grade obstruction  of the mid circumflex between the large first and third obtuse marginal branch. There is also 70% stenosis in the PDA.  Normal left ventricular function and hemodynamics.  Atrial fibrillation developed transiently, lasting approximally 3 minutes. Atrial fibrillation was incited by mechanical irritation during left ventriculography.   RECOMMENDATIONS:   Watch medial left arm for evidence of bleeding/hematoma.  Monitor femoral site for evidence of bleeding.  Heparin can probably be discontinued and converted to DVT prophylaxis.     ASSESSMENT AND PLAN:  1. CAD s/p CABG in 2009: not on aspirin given need for anticoagulation. Rare exertional angina which resolves with rest. Overall seems to be doing well from a cardiac standpoint. - Continue statin and fenofibrate - Continue metoprolol - Renewed SL nitro prescription and reviewed administration instructions/ED precautions  2. HTN: BP 120/70 today - Continue metoprolol succinate  3. HLD/hypertriglyceridemia: LDL 58 (at goal of <70), triglycerides 281 in 2018 - Plan to check FLP within 1 week at patients convenience - she will go to a [facility] facility close to her home.  - Continue atorvastatin and fenofibrate  4. DM type 2: A1C 6.1 in 2018; at goal of <7 - Continue insulin per PCP  5. CKD stage 3: No recent labs but Cr. 0.95 07/2017  6. History of PE: unprovoked. On coumadin. INR 2.1 today. - Continue coumadin per pharmacy   Current medicines are reviewed at length with the patient today.  The patient does not have concerns regarding medicines.  The following changes have been made:  no change  Labs/ tests ordered today include:   Orders Placed This Encounter  Procedures   Lipid panel   EKG 12-Lead     Disposition:   FU with Dr. Martinique in 1 year  Signed, Abigail Butts, PA-C  08/07/2019 4:03 PM

## 2019-08-07 NOTE — Addendum Note (Signed)
Addended by: Therisa Doyne on: 08/07/2019 04:39 PM   Modules accepted: Orders

## 2019-08-07 NOTE — Patient Instructions (Signed)
Medication Instructions:  Your physician recommends that you continue on your current medications as directed. Please refer to the Current Medication list given to you today.  *If you need a refill on your cardiac medications before your next appointment, please call your pharmacy*  Lab Work: Your physician recommends that you return for a FASTING lipid profile at your earliest convenience. See below for [place], [place].  If you have labs (blood work) drawn today and your tests are completely normal, you will receive your results only by: Marland Kitchen MyChart Message (if you have MyChart) OR . A paper copy in the mail If you have any lab test that is abnormal or we need to change your treatment, we will call you to review the results.  Follow-Up: At [facility], you and your health needs are our priority.  As part of our continuing mission to provide you with exceptional heart care, we have created designated Provider Care Teams.  These Care Teams include your primary Cardiologist (physician) and Advanced Practice Providers (APPs -  Physician Assistants and Nurse Practitioners) who all work together to provide you with the care you need, when you need it.  Your next appointment:   12 months  The format for your next appointment:   Either In Person or Virtual  Provider:   You may see Peter Martinique, MD or one of the following Advanced Practice Providers on your designated Care Team:    Almyra Deforest, PA-C  Fabian Sharp, PA-C or   Roby Lofts, PA-C   Other Instructions:  [facility]  [phone] [address] [place] [address]    [facility]  [phone]  [address] [place] [address]     [facility]  [phone]  [place] [address]

## 2019-08-23 DIAGNOSIS — E785 Hyperlipidemia, unspecified: Secondary | ICD-10-CM | POA: Diagnosis not present

## 2019-08-23 DIAGNOSIS — Z951 Presence of aortocoronary bypass graft: Secondary | ICD-10-CM | POA: Diagnosis not present

## 2019-08-23 DIAGNOSIS — Z7901 Long term (current) use of anticoagulants: Secondary | ICD-10-CM | POA: Diagnosis not present

## 2019-08-23 DIAGNOSIS — E781 Pure hyperglyceridemia: Secondary | ICD-10-CM | POA: Diagnosis not present

## 2019-08-23 DIAGNOSIS — I25810 Atherosclerosis of coronary artery bypass graft(s) without angina pectoris: Secondary | ICD-10-CM | POA: Diagnosis not present

## 2019-08-23 DIAGNOSIS — N1831 Chronic kidney disease, stage 3a: Secondary | ICD-10-CM | POA: Diagnosis not present

## 2019-08-23 DIAGNOSIS — I2699 Other pulmonary embolism without acute cor pulmonale: Secondary | ICD-10-CM | POA: Diagnosis not present

## 2019-08-23 DIAGNOSIS — I10 Essential (primary) hypertension: Secondary | ICD-10-CM | POA: Diagnosis not present

## 2019-08-24 LAB — LIPID PANEL
Chol/HDL Ratio: 4.3 ratio (ref 0.0–4.4)
Cholesterol, Total: 165 mg/dL (ref 100–199)
HDL: 38 mg/dL — ABNORMAL LOW (ref >39)
LDL Chol Calc (NIH): 89 mg/dL (ref 0–99)
Triglycerides: 228 mg/dL — ABNORMAL HIGH (ref 0–149)
VLDL Cholesterol Cal: 38 mg/dL (ref 5–40)

## 2019-08-28 ENCOUNTER — Telehealth: Payer: Self-pay

## 2019-08-28 NOTE — Telephone Encounter (Addendum)
Left a detailed message per DPR on file for the patient with the comments from Roby Lofts, PA-C and stated to give our office a call if she has any questions. I will also mail the results with the provider comments.  ----- Message from Abigail Butts, PA-C sent at 08/27/2019  2:41 PM EST ----- Please notify the patient that her cholesterol continues to be well controlled. She should continue taking her atorvastatin and fenofibrate as prescribed. Thank you!

## 2019-09-02 DIAGNOSIS — E079 Disorder of thyroid, unspecified: Secondary | ICD-10-CM | POA: Diagnosis not present

## 2019-09-02 DIAGNOSIS — E1165 Type 2 diabetes mellitus with hyperglycemia: Secondary | ICD-10-CM | POA: Diagnosis not present

## 2019-09-02 DIAGNOSIS — E559 Vitamin D deficiency, unspecified: Secondary | ICD-10-CM | POA: Diagnosis not present

## 2019-09-02 DIAGNOSIS — I10 Essential (primary) hypertension: Secondary | ICD-10-CM | POA: Diagnosis not present

## 2019-09-09 DIAGNOSIS — E1165 Type 2 diabetes mellitus with hyperglycemia: Secondary | ICD-10-CM | POA: Diagnosis not present

## 2019-09-09 DIAGNOSIS — Z0000 Encounter for general adult medical examination without abnormal findings: Secondary | ICD-10-CM | POA: Diagnosis not present

## 2019-09-09 DIAGNOSIS — E559 Vitamin D deficiency, unspecified: Secondary | ICD-10-CM | POA: Diagnosis not present

## 2019-09-09 DIAGNOSIS — I10 Essential (primary) hypertension: Secondary | ICD-10-CM | POA: Diagnosis not present

## 2019-09-09 DIAGNOSIS — N1831 Chronic kidney disease, stage 3a: Secondary | ICD-10-CM | POA: Diagnosis not present

## 2019-09-11 ENCOUNTER — Other Ambulatory Visit: Payer: Self-pay

## 2019-09-11 ENCOUNTER — Ambulatory Visit (INDEPENDENT_AMBULATORY_CARE_PROVIDER_SITE_OTHER): Payer: Medicare Other | Admitting: Pharmacist

## 2019-09-11 DIAGNOSIS — Z7901 Long term (current) use of anticoagulants: Secondary | ICD-10-CM | POA: Diagnosis not present

## 2019-09-11 DIAGNOSIS — I2699 Other pulmonary embolism without acute cor pulmonale: Secondary | ICD-10-CM

## 2019-09-11 LAB — POCT INR: INR: 1.9 — AB (ref 2.0–3.0)

## 2019-09-18 ENCOUNTER — Other Ambulatory Visit: Payer: Self-pay

## 2019-09-18 MED ORDER — WARFARIN SODIUM 1 MG PO TABS: ORAL_TABLET | ORAL | 0 refills | Status: DC

## 2019-10-16 ENCOUNTER — Other Ambulatory Visit: Payer: Self-pay

## 2019-10-16 ENCOUNTER — Ambulatory Visit (INDEPENDENT_AMBULATORY_CARE_PROVIDER_SITE_OTHER): Payer: Medicare Other | Admitting: Pharmacist

## 2019-10-16 DIAGNOSIS — Z7901 Long term (current) use of anticoagulants: Secondary | ICD-10-CM

## 2019-10-16 DIAGNOSIS — I2699 Other pulmonary embolism without acute cor pulmonale: Secondary | ICD-10-CM

## 2019-10-16 LAB — POCT INR: INR: 1.9 — AB (ref 2.0–3.0)

## 2019-10-23 ENCOUNTER — Other Ambulatory Visit: Payer: Self-pay

## 2019-10-23 MED ORDER — WARFARIN SODIUM 1 MG PO TABS: ORAL_TABLET | ORAL | 0 refills | Status: DC

## 2019-10-31 ENCOUNTER — Other Ambulatory Visit: Payer: Self-pay

## 2019-10-31 MED ORDER — METOPROLOL TARTRATE 25 MG PO TABS: 25.0000 mg | ORAL_TABLET | Freq: Two times a day (BID) | ORAL | 0 refills | Status: DC

## 2019-11-05 ENCOUNTER — Other Ambulatory Visit: Payer: Self-pay

## 2019-11-05 MED ORDER — OMEPRAZOLE 20 MG PO CPDR: 20.0000 mg | DELAYED_RELEASE_CAPSULE | Freq: Every day | ORAL | 2 refills | Status: DC

## 2019-11-06 ENCOUNTER — Telehealth: Payer: Self-pay

## 2019-11-06 NOTE — Telephone Encounter (Signed)
Received a call from patient.Stated she wanted to see Dr.Jordan.Stated she fell last Friday.Stated she was carrying her 2 little dogs and lost her balance.She fell in parking lot hit back of head hard on asphalt.Stated she had a small hematoma.No bleeding.She saw a primary care Dr.where she lives.He did not order a head scan.No INR done.Stated he wanted her to have carotid dopplers.Stated hematoma is better.Head is still sore to touch.Advised Dr.Jordan has a cancellation for tomorrow.Appointment scheduled 11/07/19 at 4:00 pm.Advised she can have INR done tomorrow instead of next week.

## 2019-11-07 ENCOUNTER — Ambulatory Visit (INDEPENDENT_AMBULATORY_CARE_PROVIDER_SITE_OTHER): Payer: Medicare HMO | Admitting: Cardiology

## 2019-11-07 ENCOUNTER — Other Ambulatory Visit: Payer: Self-pay

## 2019-11-07 ENCOUNTER — Ambulatory Visit (INDEPENDENT_AMBULATORY_CARE_PROVIDER_SITE_OTHER): Payer: Medicare HMO | Admitting: Pharmacist Clinician (PhC)/ Clinical Pharmacy Specialist

## 2019-11-07 ENCOUNTER — Encounter: Payer: Self-pay | Admitting: Cardiology

## 2019-11-07 VITALS — BP 201/85 | HR 68 | Temp 97.2°F | Ht 63.0 in | Wt 137.0 lb

## 2019-11-07 DIAGNOSIS — G44319 Acute post-traumatic headache, not intractable: Secondary | ICD-10-CM

## 2019-11-07 DIAGNOSIS — Z7901 Long term (current) use of anticoagulants: Secondary | ICD-10-CM | POA: Diagnosis not present

## 2019-11-07 DIAGNOSIS — W19XXXA Unspecified fall, initial encounter: Secondary | ICD-10-CM

## 2019-11-07 DIAGNOSIS — I2699 Other pulmonary embolism without acute cor pulmonale: Secondary | ICD-10-CM

## 2019-11-07 DIAGNOSIS — I25708 Atherosclerosis of coronary artery bypass graft(s), unspecified, with other forms of angina pectoris: Secondary | ICD-10-CM

## 2019-11-07 DIAGNOSIS — Z951 Presence of aortocoronary bypass graft: Secondary | ICD-10-CM | POA: Diagnosis not present

## 2019-11-07 DIAGNOSIS — I10 Essential (primary) hypertension: Secondary | ICD-10-CM | POA: Diagnosis not present

## 2019-11-07 LAB — POCT INR: INR: 1.9 — AB (ref 2.0–3.0)

## 2019-11-07 MED ORDER — LOSARTAN POTASSIUM 50 MG PO TABS: 50.0000 mg | ORAL_TABLET | Freq: Every day | ORAL | 3 refills | Status: DC

## 2019-11-07 MED ORDER — OMEPRAZOLE 20 MG PO CPDR: 20.0000 mg | DELAYED_RELEASE_CAPSULE | Freq: Every day | ORAL | 2 refills | Status: DC

## 2019-11-07 NOTE — Patient Instructions (Signed)
Increase dose to 1.5 tablets daily except 1 tablet each Sunday, Tuesday and Thursday. Repeat INR in 3 weeks. Call us with any questions [phone].

## 2019-11-07 NOTE — Patient Instructions (Signed)
We will add losartan 50 mg daily for blood pressure  We will schedule a CT scan of your head.

## 2019-11-07 NOTE — Progress Notes (Signed)
Cardiology Office Note   Date:  11/07/2019   ID:  Jennifer Warner, Jennifer Warner 11-10-1933, MRN [id number]  PCP:  Merrilee Seashore, MD  Cardiologist:  Zitlaly Malson Martinique, MD EP: None  Chief Complaint  Patient presents with  . Fall      History of Present Illness: Jennifer Warner is a 86 y.o. female is see for evaluation post fall. She has  a PMH of CAD s/p CABG in 2009 (LIMA to LAD, SVG to RCA), HTN, HLD, DM type 2 on insulin, PE on coumadin.  She was admitted in 2017/10/01 for submassive PE with right heart strain though not a candidate for thrombectomy/thombolysis, for which she was started on coumadin. Her aspirin was discontinued at that time to minimize bleeding risk. No other medication changes occurred at that visit. Her last ischemic evaluation was a LHC in 2017 which showed patent LIMA to LAD and SVG to OM, occluded LAD, moderate RCA disease, high grade obstruction of mLCx between 1st-3rd OM branch, and moderate PDA disease which was medically managed. Her last echo was 07/2017 which showed EF 55-60%, no RWMA, mild RV systolic dysfunction and severely increased PA pressures (in the setting of PE), and moderate TR.   She reports her husband died 2018/10/01 and she has since moved in with her sister in [place], [place].   She reports that last Thursday she fell in a parking lot striking the back of her head. She had a knot come up on the back of her head. Since then she has noted a HA and tightness in her head. BP has been severely elevated which is unusual for her. Feels a little shaky on her feet. Denies any visual changes, nausea, sensory or motor deficits.    Past Medical History:  Diagnosis Date  . Acute thyroiditis   . CAD (coronary artery disease)   . CAP (community acquired pneumonia) 12/2012  . Essential hypertension, benign   . GERD (gastroesophageal reflux disease)   . History of blood transfusion 05/2008   "when I had my bypass"  . Hyperkalemia   .  Hypertriglyceridemia   . Other secondary thrombocytopenia   . Polycystic ovaries   . Postsurgical aortocoronary bypass status   . Pure hypercholesterolemia   . Type II diabetes mellitus ([place])   . Urinary tract infection, site not specified     Past Surgical History:  Procedure Laterality Date  . CARDIAC CATHETERIZATION  05/2008  . CARDIAC CATHETERIZATION N/A 05/03/2016   Procedure: Left Heart Cath and Coronary Angiography;  Surgeon: Belva Crome, MD;  Location: [facility];  Service: Cardiovascular;  Laterality: N/A;  . CATARACT EXTRACTION W/ INTRAOCULAR LENS  IMPLANT, BILATERAL Bilateral ~ 2000  . [place]  . CORONARY ARTERY BYPASS GRAFT  05/2008   X2, [place] to LAD, SVG to PDA  . DILATION AND CURETTAGE OF UTERUS    . LAPAROSCOPIC CHOLECYSTECTOMY  1990s  . TUBAL LIGATION       Current Outpatient Medications  Medication Sig Dispense Refill  . atorvastatin (LIPITOR) 20 MG tablet Take 1 tablet (20 mg total) by mouth daily at 6 PM. 90 tablet 1  . diphenhydramine-acetaminophen (TYLENOL PM) 25-500 MG TABS Take 2 tablets by mouth at bedtime.    . fenofibrate (TRICOR) 145 MG tablet Take 1 tablet (145 mg total) by mouth every evening. 90 tablet 0  . LANTUS SOLOSTAR 100 UNIT/ML injection Inject 20 Units into the skin at bedtime.     Marland Kitchen  losartan (COZAAR) 50 MG tablet Take 1 tablet (50 mg total) by mouth daily. 90 tablet 3  . metoprolol tartrate (LOPRESSOR) 25 MG tablet Take 1 tablet (25 mg total) by mouth 2 (two) times daily. 180 tablet 0  . nitroGLYCERIN (NITROSTAT) 0.4 MG SL tablet Place 1 tablet (0.4 mg total) under the tongue every 5 (five) minutes x 3 doses as needed for chest pain. 25 tablet 2  . omeprazole (PRILOSEC) 20 MG capsule Take 1 capsule (20 mg total) by mouth daily. 90 capsule 2  . Vitamin D, Ergocalciferol, 50 MCG (2000 UT) CAPS Take 50,000 Units by mouth daily.   1  . warfarin (COUMADIN) 1 MG tablet Take 1-2 tablets by mouth daily as directed by coumadin  clinic 50 tablet 0   No current facility-administered medications for this visit.    Allergies:   Levofloxacin    Social History:  The patient  reports that she has never smoked. She has never used smokeless tobacco. She reports that she does not drink alcohol or use drugs.   Family History:  The patient's family history includes Cancer in her brother; Liver disease (age of onset: 46) in her mother; Stroke in her brother.    ROS:  Please see the history of present illness.   Otherwise, review of systems are positive for none.   All other systems are reviewed and negative.    PHYSICAL EXAM: VS:  BP (!) 201/85   Pulse 68   Temp (!) 97.2 F (36.2 C)   Ht 5\' 3"  (1.6 m)   Wt 137 lb (62.1 kg)   SpO2 98%   BMI 24.27 kg/m  , BMI Body mass index is 24.27 kg/m. GEN: Well nourished, well developed, in no acute distress HEENT: sclera anicteric, small scar on occiput. No significant hematoma. Neck: no JVD, carotid bruits, or masses Cardiac: RRR; no murmurs, rubs, or gallops, no edema  Respiratory:  clear to auscultation bilaterally, normal work of breathing GI: soft, nontender, nondistended, + BS MS: no deformity or atrophy Skin: warm and dry, no rash Neuro:  Strength and sensation are intact. Alert and oriented x 3.  Psych: euthymic mood, full affect   EKG:  EKG is not ordered today.   Recent Labs: No results found for requested labs within last 8760 hours.    Lipid Panel    Component Value Date/Time   CHOL 165 08/23/2019 1023   TRIG 228 (H) 08/23/2019 1023   HDL 38 (L) 08/23/2019 1023   CHOLHDL 4.3 08/23/2019 1023   CHOLHDL 5.1 08/15/2017 0401   VLDL 56 (H) 08/15/2017 0401   LDLCALC 89 08/23/2019 1023      Wt Readings from Last 3 Encounters:  11/07/19 137 lb (62.1 kg)  08/07/19 136 lb (61.7 kg)  11/29/17 142 lb 12.8 oz (64.8 kg)      Other studies Reviewed: Additional studies/ records that were reviewed today include:   Echocardiogram 07/2017 Study  Conclusions  - Left ventricle: The cavity size was normal. Wall thickness was   normal. Systolic function was normal. The estimated ejection   fraction was in the range of 55% to 60%. Wall motion was normal;   there were no regional wall motion abnormalities. Doppler   parameters are consistent with elevated mean left atrial filling   pressure. - Right ventricle: The cavity size was moderately dilated. Systolic   function was mildly reduced. - Right atrium: The atrium was mildly dilated. - Tricuspid valve: There was moderate regurgitation. - Pulmonary  arteries: Systolic pressure was severely increased. PA   peak pressure: 77 mm Hg (S).  Left heart catheterization 2017: 1. Prox RCA to Mid RCA lesion, 70% stenosed. 2. Mid Cx lesion, 80% stenosed. 3. Ost LPDA to LPDA lesion, 70% stenosed. 4. SVG was injected . 5. was injected is normal in caliber, and is anatomically normal. 6. Prox LAD to Mid LAD lesion, 100% stenosed. 7. Ost LM to LM lesion, 70% stenosed.    Prolonged procedure, caused by failed attempt at left radial approach due to radial loop and the development of a microperforation due to diagnostic catheter/guidewire injury. Hemostasis was achieved with compression and an Ace bandage.  Saphenous vein graft to the third obtuse marginal is widely patent. The procedure was prolonged because the graft was not marked and required aortography to identify the site of origin.  Widely patent LIMA to LAD.  Nondominant moderately diseased, 70% mid right coronary artery.  Total occlusion of the proximal native LAD.  High-grade obstruction of the mid circumflex between the large first and third obtuse marginal branch. There is also 70% stenosis in the PDA.  Normal left ventricular function and hemodynamics.  Atrial fibrillation developed transiently, lasting approximally 3 minutes. Atrial fibrillation was incited by mechanical irritation during left  ventriculography.   RECOMMENDATIONS:   Watch medial left arm for evidence of bleeding/hematoma.  Monitor femoral site for evidence of bleeding.  Heparin can probably be discontinued and converted to DVT prophylaxis.     ASSESSMENT AND PLAN:  1. CAD s/p CABG in 2009: not on aspirin given need for anticoagulation. No significant angina. - Continue statin and fenofibrate - Continue metoprolol  2. HTN: BP is markedly elevated - Continue metoprolol succinate - add losartan 50 mg daily.   3. HLD/hypertriglyceridemia: LDL 89  - Continue atorvastatin and fenofibrate  4. DM type 2: followed by Dr Chalmers Cater   5. History of PE: unprovoked. On coumadin. - Continue coumadin per pharmacy  6. Fall with head trauma. Some residual HA and marked increase in BP. Will check noncontrast CT to make sure there is no SDH. Check INR today.   Current medicines are reviewed at length with the patient today.  The patient does not have concerns regarding medicines.  The following changes have been made:  no change  Labs/ tests ordered today include:   No orders of the defined types were placed in this encounter.    Disposition:   FU BP check with next INR. Otherwise will follow up 6 months.  Signed, Ida Milbrath Martinique, MD  11/07/2019 4:22 PM

## 2019-11-08 ENCOUNTER — Ambulatory Visit (INDEPENDENT_AMBULATORY_CARE_PROVIDER_SITE_OTHER)
Admission: RE | Admit: 2019-11-08 | Discharge: 2019-11-08 | Disposition: A | Discharge status: Home / Self Care | Payer: Medicare HMO | Source: Ambulatory Visit | Attending: Cardiology | Admitting: Cardiology

## 2019-11-08 ENCOUNTER — Telehealth: Payer: Self-pay | Admitting: Cardiology

## 2019-11-08 DIAGNOSIS — W19XXXA Unspecified fall, initial encounter: Secondary | ICD-10-CM

## 2019-11-08 DIAGNOSIS — G44319 Acute post-traumatic headache, not intractable: Secondary | ICD-10-CM | POA: Diagnosis not present

## 2019-11-08 DIAGNOSIS — I25708 Atherosclerosis of coronary artery bypass graft(s), unspecified, with other forms of angina pectoris: Secondary | ICD-10-CM

## 2019-11-08 DIAGNOSIS — I10 Essential (primary) hypertension: Secondary | ICD-10-CM

## 2019-11-08 DIAGNOSIS — Z951 Presence of aortocoronary bypass graft: Secondary | ICD-10-CM

## 2019-11-08 NOTE — Telephone Encounter (Signed)
Patient returning call for CT results. 

## 2019-11-08 NOTE — Telephone Encounter (Signed)
I spoke with patient and reviewed CT results and recommendations from Dr Martinique with her. CT results forwarded to PCP

## 2019-11-15 ENCOUNTER — Other Ambulatory Visit: Payer: Self-pay

## 2019-11-15 MED ORDER — ATORVASTATIN CALCIUM 20 MG PO TABS: 20.0000 mg | ORAL_TABLET | Freq: Every day | ORAL | 1 refills | Status: DC

## 2019-11-20 ENCOUNTER — Encounter (HOSPITAL_COMMUNITY): Payer: Medicare HMO

## 2019-11-22 ENCOUNTER — Other Ambulatory Visit: Payer: Self-pay | Admitting: Cardiology

## 2019-11-22 MED ORDER — METOPROLOL TARTRATE 25 MG PO TABS: 25.0000 mg | ORAL_TABLET | Freq: Two times a day (BID) | ORAL | 3 refills | Status: DC

## 2019-11-22 NOTE — Telephone Encounter (Signed)
*  STAT* If patient is at the pharmacy, call can be transferred to refill team.   1. Which medications need to be refilled? (please list name of each medication and dose if known) metoprolol tartrate (LOPRESSOR) 25 MG tablet  2. Which pharmacy/location (including street and city if local pharmacy) is medication to be sent to? [facility] [address]  3. Do they need a 30 day or 90 day supply? 90    Prescription was sent to the wrong [facility], patient uses the one in [place], [place]

## 2019-11-26 ENCOUNTER — Other Ambulatory Visit: Payer: Self-pay | Admitting: Pharmacist

## 2019-11-26 MED ORDER — WARFARIN SODIUM 1 MG PO TABS: ORAL_TABLET | ORAL | 1 refills | Status: DC

## 2019-11-27 ENCOUNTER — Other Ambulatory Visit: Payer: Self-pay

## 2019-11-27 ENCOUNTER — Ambulatory Visit (INDEPENDENT_AMBULATORY_CARE_PROVIDER_SITE_OTHER): Payer: Medicare HMO | Admitting: Pharmacist Clinician (PhC)/ Clinical Pharmacy Specialist

## 2019-11-27 DIAGNOSIS — I2699 Other pulmonary embolism without acute cor pulmonale: Secondary | ICD-10-CM | POA: Diagnosis not present

## 2019-11-27 DIAGNOSIS — Z7901 Long term (current) use of anticoagulants: Secondary | ICD-10-CM

## 2019-11-27 LAB — POCT INR: INR: 2.2 (ref 2.0–3.0)

## 2019-11-27 MED ORDER — LOSARTAN POTASSIUM 100 MG PO TABS: 100.0000 mg | ORAL_TABLET | Freq: Every day | ORAL | 3 refills | Status: DC

## 2019-11-27 NOTE — Patient Instructions (Signed)
HOW TO TAKE YOUR BLOOD PRESSURE: . Rest 5 minutes before taking your blood pressure. .  Don't smoke or drink caffeinated beverages for at least 30 minutes before. . Take your blood pressure before (not after) you eat. . Sit comfortably with your back supported and both feet on the floor (don't cross your legs). . Elevate your arm to heart level on a table or a desk. . Use the proper sized cuff. It should fit smoothly and snugly around your bare upper arm. There should be enough room to slip a fingertip under the cuff. The bottom edge of the cuff should be 1 inch above the crease of the elbow. . Ideally, take 3 measurements at one sitting and record the average.

## 2019-12-19 ENCOUNTER — Ambulatory Visit (INDEPENDENT_AMBULATORY_CARE_PROVIDER_SITE_OTHER): Payer: Medicare HMO | Admitting: Pharmacist

## 2019-12-19 ENCOUNTER — Other Ambulatory Visit: Payer: Self-pay

## 2019-12-19 VITALS — BP 170/80 | HR 66 | Ht 63.0 in | Wt 137.6 lb

## 2019-12-19 DIAGNOSIS — I10 Essential (primary) hypertension: Secondary | ICD-10-CM

## 2019-12-19 DIAGNOSIS — Z7901 Long term (current) use of anticoagulants: Secondary | ICD-10-CM

## 2019-12-19 DIAGNOSIS — I2699 Other pulmonary embolism without acute cor pulmonale: Secondary | ICD-10-CM

## 2019-12-19 LAB — POCT INR: INR: 2.2 (ref 2.0–3.0)

## 2019-12-19 MED ORDER — HYDROCHLOROTHIAZIDE 12.5 MG PO CAPS: 12.5000 mg | ORAL_CAPSULE | Freq: Every day | ORAL | 0 refills | Status: DC

## 2019-12-19 NOTE — Progress Notes (Signed)
Patient ID: Jennifer Warner                 DOB: Sep 04, 1934                      MRN: [id number]     HPI: Jennifer Warner is a 86 y.o. female referred by Dr.Jordan to HTN clinic. PMH includes CAD s/p CABG, HTN, hyperlipidemia, DM, and hx of PE. During last OV with Dr Martinique her BP was elevated and losartan 50mg  daily was added to her regimen, a month later her losartan was increased to 100mg  daily during visit to coumadin clinic. Patient presents today for follow up and further medication titration if needed.   Current HTN meds:  Losartan 100mg  daily Metoprolol 25mg  twice daily  Intolerance: none  BP goal: 130/80  Family History: The patient's family history includes Cancer in her brother; Liver disease (age of onset: 40) in her mother; Stroke in her brother.   Social History: The patient  reports that she has never smoked. She has never used smokeless tobacco. She reports that she does not drink alcohol or use drugs.   Diet: home cooked meals mainly, "avoids salt and sugars" but continues to eat [place] and [place] take out.  Exercise: exercise some at home, walks 2 puppies every day  Home BP readings:  30 readings from home, range 130/80 to 169/90 (HR 62-88bpm)  Wt Readings from Last 3 Encounters:  12/19/19 137 lb 9.6 oz (62.4 kg)  11/07/19 137 lb (62.1 kg)  08/07/19 136 lb (61.7 kg)   BP Readings from Last 3 Encounters:  12/19/19 (!) 170/80  11/07/19 (!) 201/85  08/07/19 120/70   Pulse Readings from Last 3 Encounters:  12/19/19 66  11/07/19 68  08/07/19 77     Past Medical History:  Diagnosis Date  . Acute thyroiditis   . CAD (coronary artery disease)   . CAP (community acquired pneumonia) 12/2012  . Essential hypertension, benign   . GERD (gastroesophageal reflux disease)   . History of blood transfusion 05/2008   "when I had my bypass"  . Hyperkalemia   . Hypertriglyceridemia   . Other secondary thrombocytopenia   . Polycystic ovaries   . Postsurgical  aortocoronary bypass status   . Pure hypercholesterolemia   . Type II diabetes mellitus ([place])   . Urinary tract infection, site not specified     Current Outpatient Medications on File Prior to Visit  Medication Sig Dispense Refill  . atorvastatin (LIPITOR) 20 MG tablet Take 1 tablet (20 mg total) by mouth daily at 6 PM. 90 tablet 1  . diphenhydramine-acetaminophen (TYLENOL PM) 25-500 MG TABS Take 2 tablets by mouth at bedtime.    . fenofibrate (TRICOR) 145 MG tablet Take 1 tablet (145 mg total) by mouth every evening. 90 tablet 0  . LANTUS SOLOSTAR 100 UNIT/ML injection Inject 20 Units into the skin at bedtime.     Marland Kitchen losartan (COZAAR) 100 MG tablet Take 1 tablet (100 mg total) by mouth daily. 90 tablet 3  . metoprolol tartrate (LOPRESSOR) 25 MG tablet Take 1 tablet (25 mg total) by mouth 2 (two) times daily. 180 tablet 3  . nitroGLYCERIN (NITROSTAT) 0.4 MG SL tablet Place 1 tablet (0.4 mg total) under the tongue every 5 (five) minutes x 3 doses as needed for chest pain. 25 tablet 2  . omeprazole (PRILOSEC) 20 MG capsule Take 1 capsule (20 mg total) by mouth daily. 90 capsule 2  .  Vitamin D, Ergocalciferol, 50 MCG (2000 UT) CAPS Take 50,000 Units by mouth daily.   1  . warfarin (COUMADIN) 1 MG tablet Take 1 to 1 and 1/2 tablets by mouth daily as directed by coumadin clinic 45 tablet 1   No current facility-administered medications on file prior to visit.    Allergies  Allergen Reactions  . Levofloxacin Nausea And Vomiting    Blood pressure (!) 170/80, pulse 66, height 5\' 3"  (1.6 m), weight 137 lb 9.6 oz (62.4 kg), SpO2 94 %.  Essential hypertension Blood pressure remains above goal and patient has problems to identify low sodium alternatives while ordering take out or eating in a restaurant. We discussed need to decrease sodium in diet by avoiding sauces, [place] and [place] food and preserved meats whne possible. Also need to increase hydration and stay active.  Will add HCTZ 25mg   daily to current regimen and consider change to losartan/HCT if blood pressure improved during next OV.    Felicidad Sugarman Rodriguez-Guzman PharmD, BCPS, [place] [address] [place] [address] 12/30/2019 8:13 AM

## 2019-12-19 NOTE — Patient Instructions (Signed)
Return for a  follow up appointment in 5 weeks  Check your blood pressure at home daily (if able) and keep record of the readings.  Take your BP meds as follows: *START taking HCTZ 12.5mg  daily in the morning*  Bring all of your meds, your BP cuff and your record of home blood pressures to your next appointment.  Exercise as you're able, try to walk approximately 30 minutes per day.  Keep salt intake to a minimum, especially watch canned and prepared boxed foods.  Eat more fresh fruits and vegetables and fewer canned items.  Avoid eating in fast food restaurants.    HOW TO TAKE YOUR BLOOD PRESSURE: . Rest 5 minutes before taking your blood pressure. .  Don't smoke or drink caffeinated beverages for at least 30 minutes before. . Take your blood pressure before (not after) you eat. . Sit comfortably with your back supported and both feet on the floor (don't cross your legs). . Elevate your arm to heart level on a table or a desk. . Use the proper sized cuff. It should fit smoothly and snugly around your bare upper arm. There should be enough room to slip a fingertip under the cuff. The bottom edge of the cuff should be 1 inch above the crease of the elbow. . Ideally, take 3 measurements at one sitting and record the average.

## 2019-12-30 ENCOUNTER — Encounter: Payer: Self-pay | Admitting: Pharmacist

## 2019-12-30 NOTE — Assessment & Plan Note (Signed)
Blood pressure remains above goal and patient has problems to identify low sodium alternatives while ordering take out or eating in a restaurant. We discussed need to decrease sodium in diet by avoiding sauces, [place] and [place] food and preserved meats whne possible. Also need to increase hydration and stay active.  Will add HCTZ 25mg  daily to current regimen and consider change to losartan/HCT if blood pressure improved during next OV.

## 2020-01-21 ENCOUNTER — Other Ambulatory Visit: Payer: Self-pay

## 2020-01-21 ENCOUNTER — Ambulatory Visit (INDEPENDENT_AMBULATORY_CARE_PROVIDER_SITE_OTHER): Payer: Medicare HMO | Admitting: Pharmacist

## 2020-01-21 DIAGNOSIS — I2699 Other pulmonary embolism without acute cor pulmonale: Secondary | ICD-10-CM | POA: Diagnosis not present

## 2020-01-21 DIAGNOSIS — Z7901 Long term (current) use of anticoagulants: Secondary | ICD-10-CM

## 2020-01-21 LAB — POCT INR: INR: 2.5 (ref 2.0–3.0)

## 2020-01-21 MED ORDER — HYDROCHLOROTHIAZIDE 12.5 MG PO CAPS: 12.5000 mg | ORAL_CAPSULE | Freq: Every day | ORAL | 1 refills | Status: DC

## 2020-01-21 MED ORDER — VALSARTAN 160 MG PO TABS: 160.0000 mg | ORAL_TABLET | Freq: Every day | ORAL | 1 refills | Status: DC

## 2020-01-29 ENCOUNTER — Other Ambulatory Visit: Payer: Self-pay | Admitting: Cardiology

## 2020-01-29 MED ORDER — FENOFIBRATE 145 MG PO TABS: 145.0000 mg | ORAL_TABLET | Freq: Every evening | ORAL | 2 refills | Status: DC

## 2020-01-29 NOTE — Telephone Encounter (Signed)
*  STAT* If patient is at the pharmacy, call can be transferred to refill team.   1. Which medications need to be refilled? (please list name of each medication and dose if known) fenofibrate (TRICOR) 145 MG tablet  2. Which pharmacy/location (including street and city if local pharmacy) is medication to be sent to? [id number] [address], [place].  3. Do they need a 30 day or 90 day supply? 90 days supply

## 2020-01-31 ENCOUNTER — Other Ambulatory Visit: Payer: Self-pay | Admitting: *Deleted

## 2020-02-12 ENCOUNTER — Other Ambulatory Visit: Payer: Self-pay | Admitting: Cardiology

## 2020-03-03 ENCOUNTER — Other Ambulatory Visit: Payer: Self-pay

## 2020-03-03 ENCOUNTER — Ambulatory Visit (INDEPENDENT_AMBULATORY_CARE_PROVIDER_SITE_OTHER): Payer: Medicare HMO | Admitting: Pharmacist Clinician (PhC)/ Clinical Pharmacy Specialist

## 2020-03-03 DIAGNOSIS — I2699 Other pulmonary embolism without acute cor pulmonale: Secondary | ICD-10-CM

## 2020-03-03 DIAGNOSIS — Z7901 Long term (current) use of anticoagulants: Secondary | ICD-10-CM | POA: Diagnosis not present

## 2020-03-03 DIAGNOSIS — I10 Essential (primary) hypertension: Secondary | ICD-10-CM

## 2020-03-03 LAB — POCT INR: INR: 3 (ref 2.0–3.0)

## 2020-03-04 ENCOUNTER — Telehealth: Payer: Self-pay | Admitting: Pharmacist Clinician (PhC)/ Clinical Pharmacy Specialist

## 2020-03-04 LAB — BASIC METABOLIC PANEL
BUN/Creatinine Ratio: 24 (ref 12–28)
BUN: 32 mg/dL — ABNORMAL HIGH (ref 8–27)
CO2: 23 mmol/L (ref 20–29)
Calcium: 10.4 mg/dL — ABNORMAL HIGH (ref 8.7–10.3)
Chloride: 104 mmol/L (ref 96–106)
Creatinine, Ser: 1.33 mg/dL — ABNORMAL HIGH (ref 0.57–1.00)
GFR calc Af Amer: 43 mL/min/{1.73_m2} — ABNORMAL LOW (ref >59)
GFR calc non Af Amer: 37 mL/min/{1.73_m2} — ABNORMAL LOW (ref >59)
Glucose: 104 mg/dL — ABNORMAL HIGH (ref 65–99)
Potassium: 5.5 mmol/L — ABNORMAL HIGH (ref 3.5–5.2)
Sodium: 140 mmol/L (ref 134–144)

## 2020-03-04 MED ORDER — AMLODIPINE BESYLATE 2.5 MG PO TABS: 2.5000 mg | ORAL_TABLET | Freq: Every day | ORAL | 3 refills | Status: DC

## 2020-03-04 NOTE — Progress Notes (Signed)
Patient brought in list of home BP readings since last INR check.  Averaging in the [id number] systolic range at home with a few excursions into the 160's.  No complaints on medications.  Sending patient to lab today for BMET due to hctz and switch from losartan to valsartan.

## 2020-03-04 NOTE — Telephone Encounter (Signed)
Spoke with patient - she understands to stop hctz and start amlodipine 2.5 mg daily.  We will plan to repeat BMET at her next INR check

## 2020-03-04 NOTE — Telephone Encounter (Signed)
-----   Message from Peter M Martinique, MD sent at 03/04/2020  7:37 AM EDT ----- The following abnormalities are noted:  renal function is worse and potassium is high since starting HCTZ.  All other values are normal, stable or within acceptable limits. Medication changes / Follow up labs / Other changes or recommendations:   Recommend stopping HCTZ. Add amlodipine 2.5 mg daily. Will cc HTN clinic  Peter Martinique, MD 03/04/2020 7:36 AM

## 2020-03-06 ENCOUNTER — Telehealth: Payer: Self-pay

## 2020-03-06 NOTE — Telephone Encounter (Signed)
Called patient left message on personal voice mail recent lab results.Advised to stop HCTZ.Continue all other medications.Advised to keep appointment already scheduled with HTN clinic 6/30 at 1:30 pm.

## 2020-03-09 NOTE — Telephone Encounter (Signed)
Spoke to patient she received message to stop HCTZ.Continue Amlodipine 2.5 mg daily.Keep appointment already scheduled with HTN Clinic.

## 2020-03-09 NOTE — Telephone Encounter (Signed)
  Patient is returning call and would like to speak to nurse.

## 2020-04-11 ENCOUNTER — Other Ambulatory Visit: Payer: Self-pay | Admitting: Cardiology

## 2020-04-15 ENCOUNTER — Other Ambulatory Visit: Payer: Self-pay

## 2020-04-15 ENCOUNTER — Ambulatory Visit (INDEPENDENT_AMBULATORY_CARE_PROVIDER_SITE_OTHER): Payer: Medicare HMO

## 2020-04-15 DIAGNOSIS — Z7901 Long term (current) use of anticoagulants: Secondary | ICD-10-CM | POA: Diagnosis not present

## 2020-04-15 DIAGNOSIS — I2699 Other pulmonary embolism without acute cor pulmonale: Secondary | ICD-10-CM

## 2020-04-15 LAB — POCT INR: INR: 2.4 (ref 2.0–3.0)

## 2020-04-15 NOTE — Patient Instructions (Signed)
Continue with 1.5 tablets daily except 1 tablet each Sunday, Tuesday and Thursday. Repeat INR in 6 weeks. Call us with any questions [phone].  

## 2020-04-17 ENCOUNTER — Other Ambulatory Visit: Payer: Self-pay | Admitting: Pharmacist Clinician (PhC)/ Clinical Pharmacy Specialist

## 2020-04-17 LAB — BASIC METABOLIC PANEL
BUN/Creatinine Ratio: 27 (ref 12–28)
BUN: 39 mg/dL — ABNORMAL HIGH (ref 8–27)
CO2: 22 mmol/L (ref 20–29)
Calcium: 10.3 mg/dL (ref 8.7–10.3)
Chloride: 106 mmol/L (ref 96–106)
Creatinine, Ser: 1.44 mg/dL — ABNORMAL HIGH (ref 0.57–1.00)
GFR calc Af Amer: 39 mL/min/{1.73_m2} — ABNORMAL LOW (ref >59)
GFR calc non Af Amer: 34 mL/min/{1.73_m2} — ABNORMAL LOW (ref >59)
Glucose: 101 mg/dL — ABNORMAL HIGH (ref 65–99)
Potassium: 5 mmol/L (ref 3.5–5.2)
Sodium: 141 mmol/L (ref 134–144)

## 2020-04-17 MED ORDER — AMLODIPINE BESYLATE 5 MG PO TABS
5.0000 mg | ORAL_TABLET | Freq: Every day | ORAL | 3 refills | Status: DC
Start: 2020-04-17 — End: 2020-08-07

## 2020-04-17 NOTE — Telephone Encounter (Signed)
See labs 

## 2020-04-23 ENCOUNTER — Telehealth: Payer: Self-pay | Admitting: Cardiology

## 2020-04-23 NOTE — Telephone Encounter (Signed)
Called patient, looked back at message from May that stated to stop HCTZ start Amlodipine- patient did remember this, will disregard the medication that was given. Patient thankful for call back, no other questions.

## 2020-04-23 NOTE — Telephone Encounter (Signed)
Pt c/o medication issue:  1. Name of Medication: hydrochlorothiazide 12.5 mg   2. How are you currently taking this medication (dosage and times per day)? Not taking yet  3. Are you having a reaction (difficulty breathing--STAT)? no  4. What is your medication issue? Patient states the pharmacy filled the prescription for the medication, but it is not in her list of medications. She would like to know if she is supposed to take it or not.

## 2020-05-17 ENCOUNTER — Other Ambulatory Visit: Payer: Self-pay | Admitting: Cardiology

## 2020-06-05 ENCOUNTER — Ambulatory Visit (INDEPENDENT_AMBULATORY_CARE_PROVIDER_SITE_OTHER): Payer: Medicare HMO

## 2020-06-05 ENCOUNTER — Other Ambulatory Visit: Payer: Self-pay

## 2020-06-05 ENCOUNTER — Other Ambulatory Visit: Payer: Self-pay | Admitting: Cardiology

## 2020-06-05 DIAGNOSIS — Z7901 Long term (current) use of anticoagulants: Secondary | ICD-10-CM | POA: Diagnosis not present

## 2020-06-05 DIAGNOSIS — I2699 Other pulmonary embolism without acute cor pulmonale: Secondary | ICD-10-CM | POA: Diagnosis not present

## 2020-06-05 LAB — POCT INR: INR: 2.2 (ref 2.0–3.0)

## 2020-06-05 NOTE — Patient Instructions (Addendum)
Continue with 1.5 tablets daily except 1 tablet each Sunday, Tuesday and Thursday. Repeat INR in 8 weeks. Call us with any questions [phone]. BP 130/70

## 2020-06-08 ENCOUNTER — Telehealth: Payer: Self-pay | Admitting: Cardiology

## 2020-06-08 NOTE — Telephone Encounter (Signed)
The patient's wife was calling to state that the husband, also a patient of Dr. Doug Sou, got tested for COVID today.  She has been advised that if it is positive then she should call the patient's PCP to make them aware.

## 2020-06-08 NOTE — Telephone Encounter (Signed)
    Pt would like to speak with a nurse, she said her husband might have covid 66 and would like to know what she can do as preventive.

## 2020-06-15 IMAGING — CT CT HEAD W/O CM
3 series · 15 of 46 positions shown, 18 images · non-contrast
Comparison: 08/15/2017

CLINICAL DATA: Head trauma and scalp hematoma. Patient hit her left
side of head.

EXAM:
CT HEAD WITHOUT CONTRAST
TECHNIQUE: Contiguous axial images were obtained from the base of the skull
through the vertex without intravenous contrast.

[Series 3: head 5.0 h37s · axial · 0.43mm/px · z∈[-149,-29]mm · 9 of 29 slices shown, 12 images]
[im 3/29  brain]
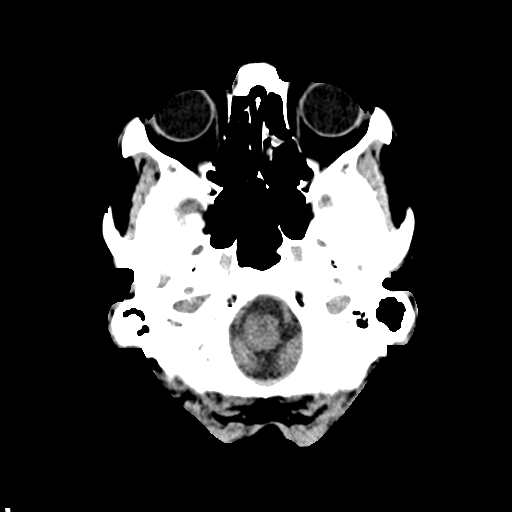
[im 3/29  bone]
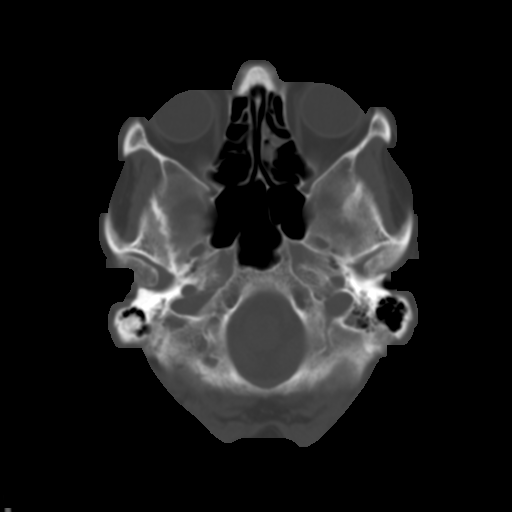
[im 6/29  brain]
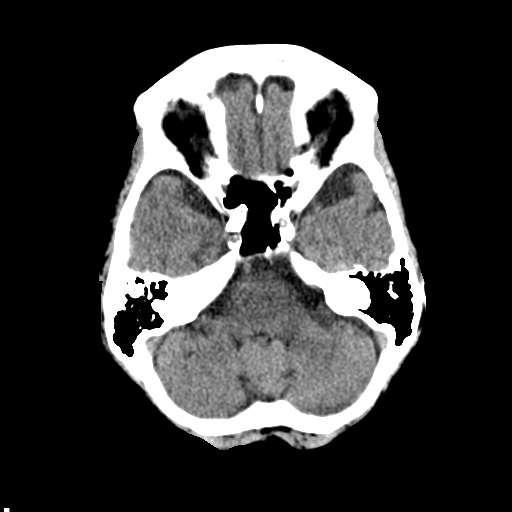
[im 9/29  brain]
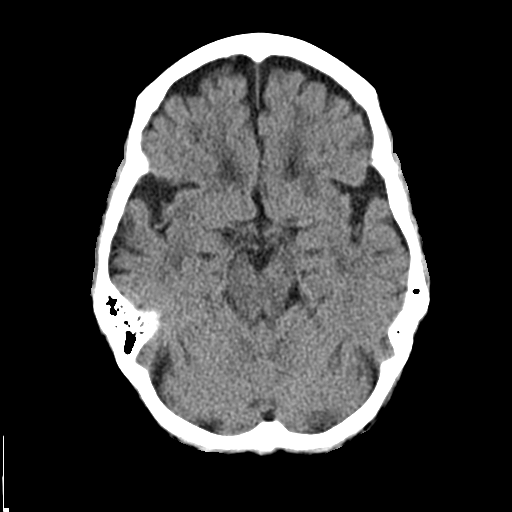
[im 12/29  brain]
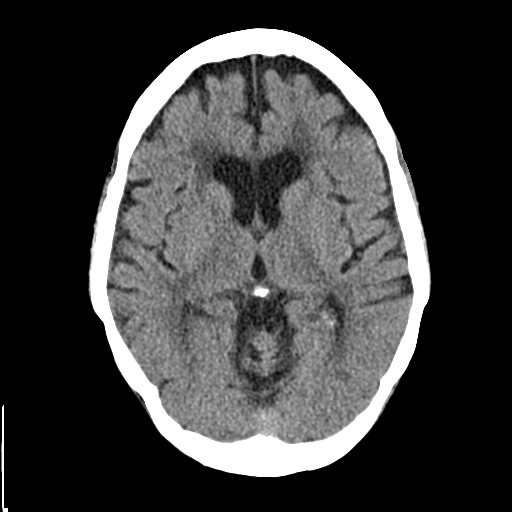
[im 15/29  brain]
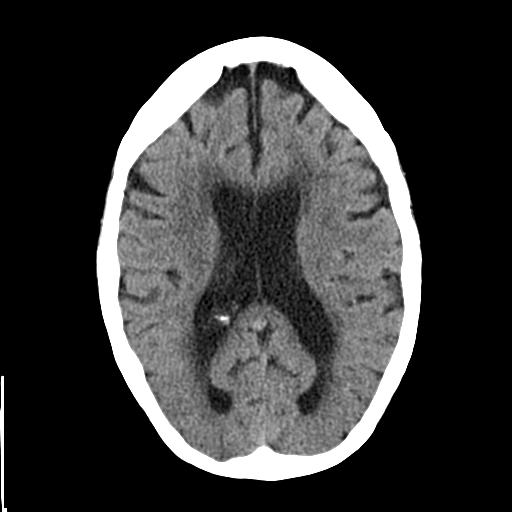
[im 15/29  bone]
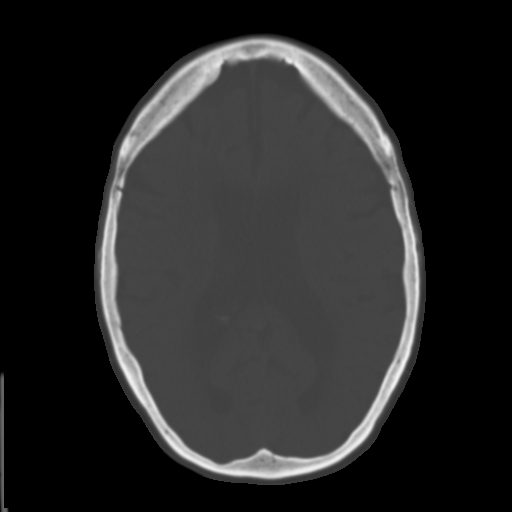
[im 18/29  brain]
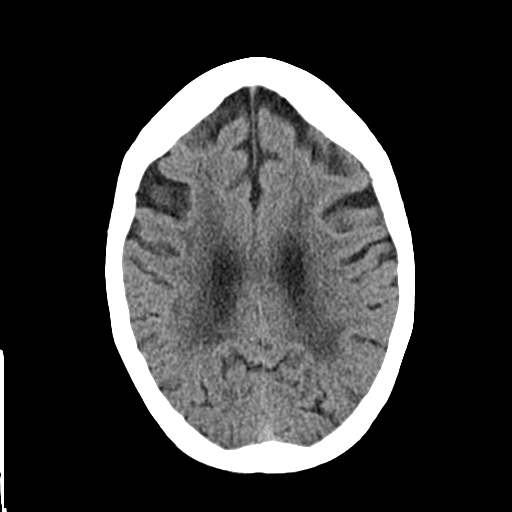
[im 21/29  brain]
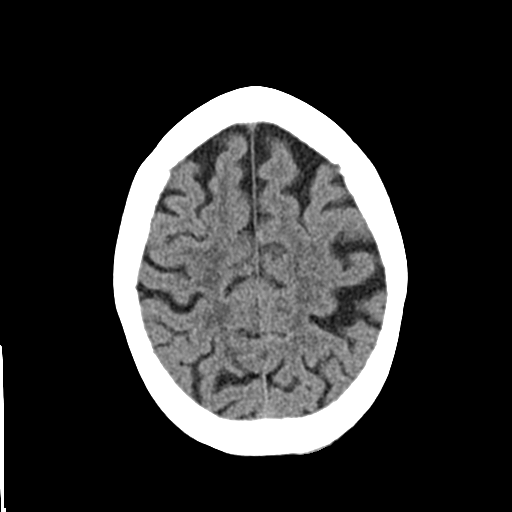
[im 24/29  brain]
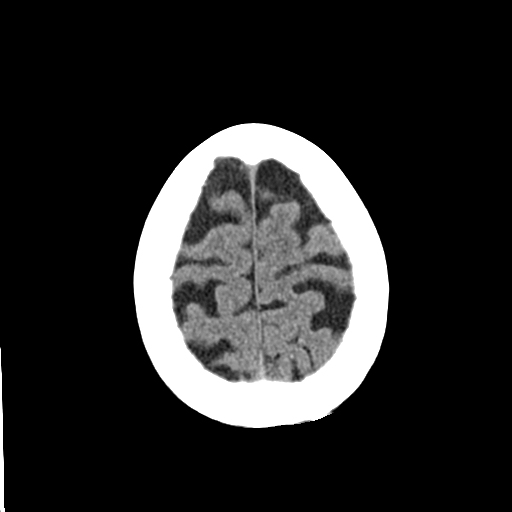
[im 27/29  brain]
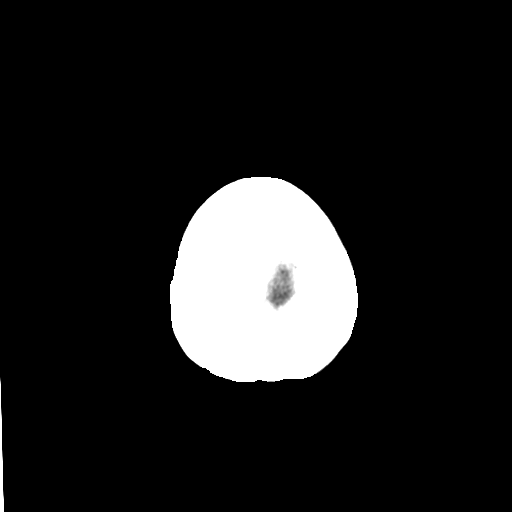
[im 27/29  bone]
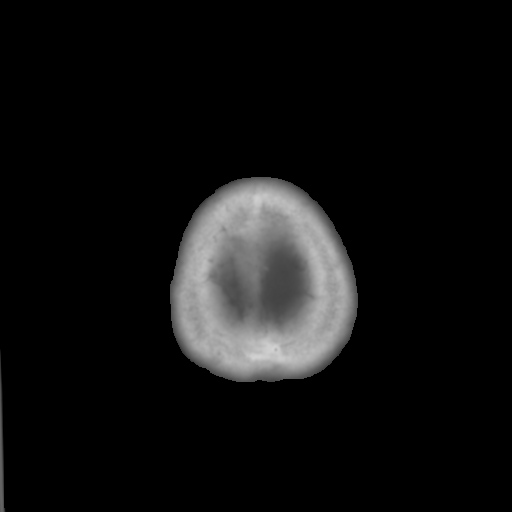

[Series 4: head 3.0 mpr coronal · coronal · 0.31mm/px · 3 of 62 slices shown]
[im 21/62  brain]
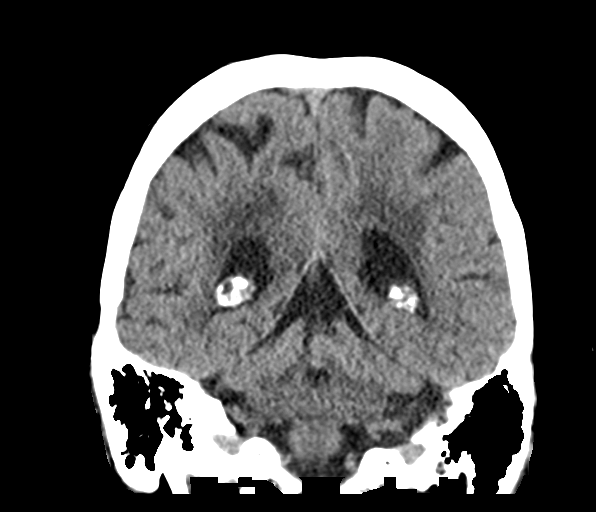
[im 28/62  brain]
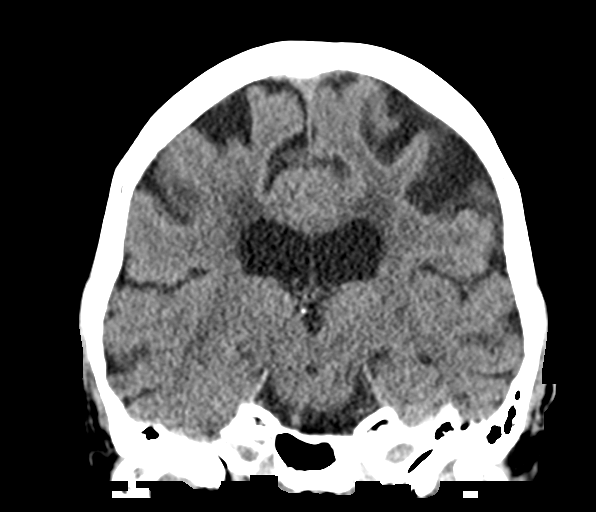
[im 34/62  brain]
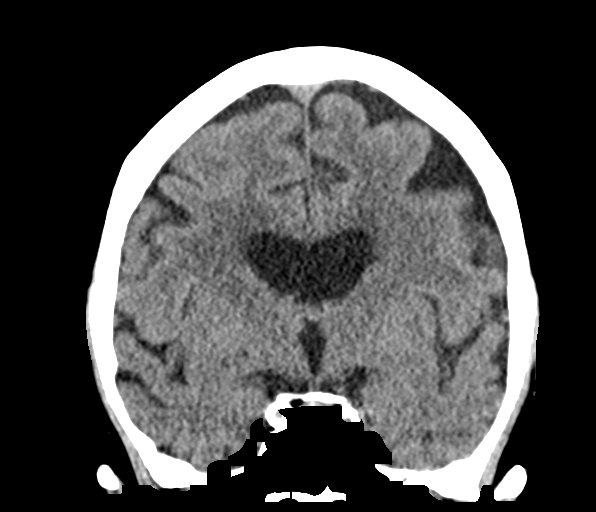

[Series 5: head 3.0 mpr sagittal · sagittal · 0.31mm/px · 3 of 62 slices shown]
[im 21/62  brain]
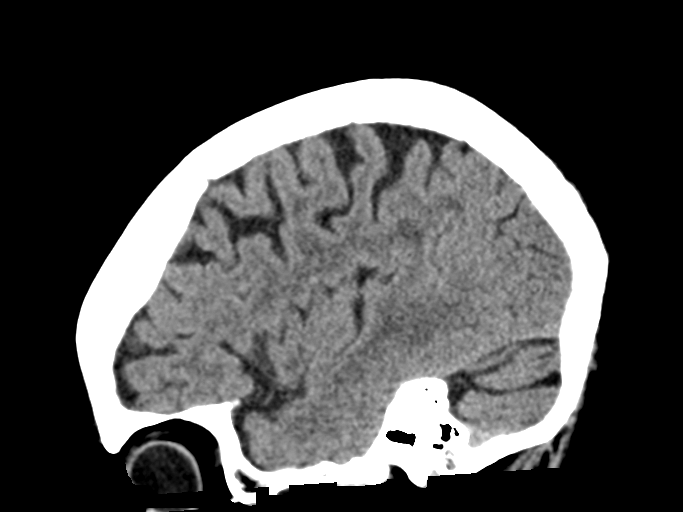
[im 31/62  brain]
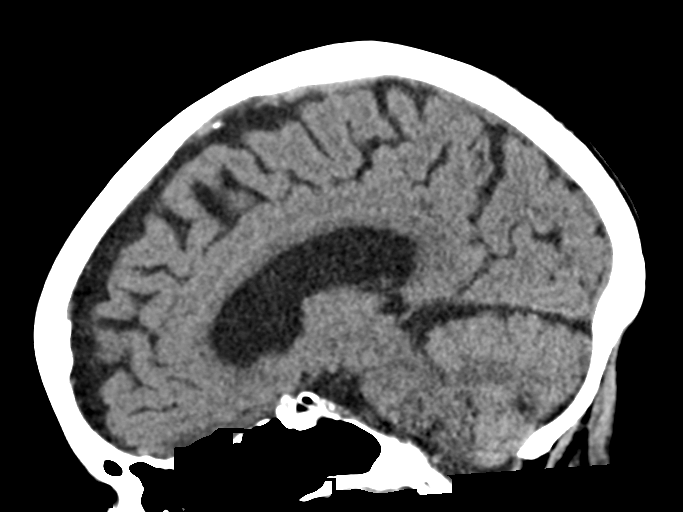
[im 41/62  brain]
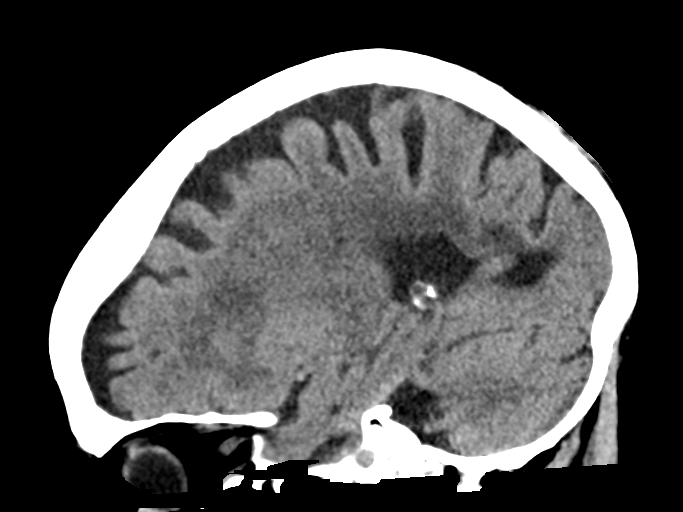

[15 of 46 positions shown; findings below may reference images not displayed]

FINDINGS: Brain: No evidence of acute infarction, hemorrhage, hydrocephalus,
extra-axial collection or mass lesion/mass effect. There is mild
diffuse low-attenuation within the subcortical and periventricular
white matter compatible with chronic microvascular disease.
Prominence of sulci and ventricles compatible with brain atrophy.

Vascular: No hyperdense vessel or unexpected calcification.

Skull: Normal. Negative for fracture or focal lesion.

Sinuses/Orbits: No acute finding.

Other: There is a small scalp hematoma overlying the left posterior
calvarium, image [DATE].
IMPRESSION: 1. No acute intracranial abnormalities.
2. Chronic small vessel ischemic disease and brain atrophy.

## 2020-06-18 ENCOUNTER — Telehealth: Payer: Self-pay | Admitting: Cardiology

## 2020-06-18 NOTE — Telephone Encounter (Signed)
Pt returning call

## 2020-06-18 NOTE — Telephone Encounter (Signed)
Called and spoke to patient. She states that she has already spoken to [facility] regarding her hip pain.

## 2020-06-18 NOTE — Telephone Encounter (Signed)
Called patient no answer.LMTC. 

## 2020-06-18 NOTE — Telephone Encounter (Signed)
Patient states she is having hip pain and would like to talk with Dr. Doug Sou nurse about it. Please advise.

## 2020-06-18 NOTE — Telephone Encounter (Signed)
Spoke to patient she stated she recently remarried and moved back to [place].Stated she has been lifting heavy boxes.She started having pain in lower back radiating down left hip and leg.She has been applying heat and taking Tylenol with no relief.Advised to see PCP.She stated he is out of office until next Tuesday.Advised to go to a urgent care.I will make Dr.Jordan aware.

## 2020-07-17 ENCOUNTER — Other Ambulatory Visit: Payer: Self-pay | Admitting: Cardiology

## 2020-07-17 ENCOUNTER — Telehealth: Payer: Self-pay

## 2020-07-17 DIAGNOSIS — M25552 Pain in left hip: Secondary | ICD-10-CM | POA: Diagnosis not present

## 2020-07-17 DIAGNOSIS — R262 Difficulty in walking, not elsewhere classified: Secondary | ICD-10-CM | POA: Diagnosis not present

## 2020-07-17 DIAGNOSIS — M6281 Muscle weakness (generalized): Secondary | ICD-10-CM | POA: Diagnosis not present

## 2020-07-17 DIAGNOSIS — M5416 Radiculopathy, lumbar region: Secondary | ICD-10-CM | POA: Diagnosis not present

## 2020-07-17 NOTE — Telephone Encounter (Signed)
Pt has appt with Dr. Martinique 08/07/20. Will get pre op assessment at that time. I will forward clearance notes to MD for upcoming appt. Will send FYI to requesting office. Will remove from the pre op call back pool.

## 2020-07-17 NOTE — Telephone Encounter (Signed)
   [facility] Pre-operative Risk Assessment    Request for surgical clearance:  1. What type of surgery is being performed? LEFT L4-5 & L5-S1 TRANSFORAMINAL EPIDURAL INJECTION   2. When is this surgery scheduled? TBD   3. What type of clearance is required (medical clearance vs. Pharmacy clearance to hold med vs. Both)? BOTH  4. Are there any medications that need to be held prior to surgery and how long?  WARFARIN-5 DAYS   5. Practice name and name of physician performing surgery? [facility] [place]  ATTN:SANDRA  6. What is the office phone number? [phone]   7.   What is the office fax number? [phone]  8.   Anesthesia type (None, local, MAC, general) ? LOCAL

## 2020-07-17 NOTE — Telephone Encounter (Signed)
Primary Cardiologist:Peter Martinique, MD  Chart reviewed as part of pre-operative protocol coverage. Because of Natara Monfort [name]'s past medical history and time since last visit, he/she will require a follow-up visit in order to better assess preoperative cardiovascular risk.  Pre-op covering staff: - Please schedule appointment and call patient to inform them. - Please contact requesting surgeon's office via preferred method (i.e, phone, fax) to inform them of need for appointment prior to surgery.  If applicable, this message will also be routed to pharmacy pool and/or primary cardiologist for input on holding anticoagulant/antiplatelet agent as requested below so that this information is available at time of patient's appointment.   Deberah Pelton, NP  07/17/2020, 12:13 PM

## 2020-08-03 NOTE — Progress Notes (Signed)
Cardiology Office Note   Date:  08/07/2020   ID:  Jennifer Warner 07/16/1934, MRN [id number]  PCP:  Merrilee Seashore, MD  Cardiologist:  Juda Lajeunesse Martinique, MD EP: None  Chief Complaint  Patient presents with  . Coronary Artery Disease      History of Present Illness: Jennifer Warner is a 86 y.o. female is see for follow up CAD. She has  a PMH of CAD s/p CABG in 2009 (LIMA to LAD, SVG to RCA), HTN, HLD, DM type 2 on insulin, PE on coumadin.  She was admitted in 09/27/2017 for submassive PE with right heart strain though not a candidate for thrombectomy/thombolysis, for which she was started on coumadin. Her aspirin was discontinued at that time to minimize bleeding risk. No other medication changes occurred at that visit. Her last ischemic evaluation was a LHC in 2017 which showed patent LIMA to LAD and SVG to OM, occluded LAD, moderate RCA disease, high grade obstruction of mLCx between 1st-3rd OM branch, and moderate PDA disease which was medically managed. Her last echo was 07/2017 which showed EF 55-60%, no RWMA, mild RV systolic dysfunction and severely increased PA pressures (in the setting of PE), and moderate TR.   She first  husband died 2018-09-27 and she was living with her sister in [place], [place]. She has since remarried 3 months ago and has moved back to [place]. She did injure her back on moving and is having a lot of back pain. Spinal injection is planned.   She feels well from a cardiac standpoint. No chest pain or dyspnea. No palpitations or edema. Planning to have lab work soon with her endocrinologist and primary care.    Past Medical History:  Diagnosis Date  . Acute thyroiditis   . CAD (coronary artery disease)   . CAP (community acquired pneumonia) 12/2012  . Essential hypertension, benign   . GERD (gastroesophageal reflux disease)   . History of blood transfusion 05/2008   "when I had my bypass"  . Hyperkalemia   . Hypertriglyceridemia   .  Other secondary thrombocytopenia   . Polycystic ovaries   . Postsurgical aortocoronary bypass status   . Pure hypercholesterolemia   . Type II diabetes mellitus ([place])   . Urinary tract infection, site not specified     Past Surgical History:  Procedure Laterality Date  . CARDIAC CATHETERIZATION  05/2008  . CARDIAC CATHETERIZATION N/A 05/03/2016   Procedure: Left Heart Cath and Coronary Angiography;  Surgeon: Belva Crome, MD;  Location: [facility];  Service: Cardiovascular;  Laterality: N/A;  . CATARACT EXTRACTION W/ INTRAOCULAR LENS  IMPLANT, BILATERAL Bilateral ~ 2000  . [place]  . CORONARY ARTERY BYPASS GRAFT  05/2008   X2, [place] to LAD, SVG to PDA  . DILATION AND CURETTAGE OF UTERUS    . LAPAROSCOPIC CHOLECYSTECTOMY  1990s  . TUBAL LIGATION       Current Outpatient Medications  Medication Sig Dispense Refill  . atorvastatin (LIPITOR) 20 MG tablet TAKE 1 TABLET BY MOUTH ONCE DAILY AT 6PM 90 tablet 1  . diphenhydramine-acetaminophen (TYLENOL PM) 25-500 MG TABS Take 2 tablets by mouth at bedtime.    . fenofibrate (TRICOR) 145 MG tablet Take 1 tablet (145 mg total) by mouth every evening. 90 tablet 2  . hydrochlorothiazide (MICROZIDE) 12.5 MG capsule TAKE 1 CAPSULE BY MOUTH DAILY 90 capsule 1  . Insulin Glargine (BASAGLAR KWIKPEN) 100 UNIT/ML Inject into the skin daily.    Marland Kitchen  LANTUS SOLOSTAR 100 UNIT/ML injection Inject 20 Units into the skin at bedtime.     . metoprolol tartrate (LOPRESSOR) 25 MG tablet Take 1 tablet (25 mg total) by mouth 2 (two) times daily. 180 tablet 3  . nitroGLYCERIN (NITROSTAT) 0.4 MG SL tablet Place 1 tablet (0.4 mg total) under the tongue every 5 (five) minutes x 3 doses as needed for chest pain. 25 tablet 2  . omeprazole (PRILOSEC) 20 MG capsule Take 1 capsule (20 mg total) by mouth daily. 90 capsule 2  . valsartan (DIOVAN) 160 MG tablet TAKE 1 TABLET BY MOUTH EVERY DAY *REPLACES LOSARTAN* 90 tablet 1  . Vitamin D, Ergocalciferol, 50  MCG (2000 UT) CAPS Take 50,000 Units by mouth daily.   1  . warfarin (COUMADIN) 1 MG tablet TAKE 1 TO 1 & 1/2 TABLETS BY MOUTH DAILY AS DIRECTED BY COUMADIN CLINIC 45 tablet 1  . amLODipine (NORVASC) 5 MG tablet Take 1 tablet (5 mg total) by mouth daily. 90 tablet 3   No current facility-administered medications for this visit.    Allergies:   Levofloxacin    Social History:  The patient  reports that she has never smoked. She has never used smokeless tobacco. She reports that she does not drink alcohol and does not use drugs.   Family History:  The patient's family history includes Cancer in her brother; Liver disease (age of onset: 81) in her mother; Stroke in her brother.    ROS:  Please see the history of present illness.   Otherwise, review of systems are positive for none.   All other systems are reviewed and negative.    PHYSICAL EXAM: VS:  BP 126/84   Pulse 69   Ht 5\' 3"  (1.6 m)   Wt 119 lb 3.2 oz (54.1 kg)   SpO2 98%   BMI 21.12 kg/m  , BMI Body mass index is 21.12 kg/m. GEN: Well nourished, well developed, in no acute distress HEENT: sclera anicteric, small scar on occiput. No significant hematoma. Neck: no JVD, carotid bruits, or masses Cardiac: RRR; no murmurs, rubs, or gallops, no edema  Respiratory:  clear to auscultation bilaterally, normal work of breathing GI: soft, nontender, nondistended, + BS MS: no deformity or atrophy Skin: warm and dry, no rash Neuro:  Strength and sensation are intact. Alert and oriented x 3.  Psych: euthymic mood, full affect   EKG:  EKG is  ordered today.NSR rate 69. Normal. I have personally reviewed and interpreted this study.    Recent Labs: 04/16/2020: BUN 39; Creatinine, Ser 1.44; Potassium 5.0; Sodium 141    Lipid Panel    Component Value Date/Time   CHOL 165 08/23/2019 1023   TRIG 228 (H) 08/23/2019 1023   HDL 38 (L) 08/23/2019 1023   CHOLHDL 4.3 08/23/2019 1023   CHOLHDL 5.1 08/15/2017 0401   VLDL 56 (H)  08/15/2017 0401   LDLCALC 89 08/23/2019 1023      Wt Readings from Last 3 Encounters:  08/07/20 119 lb 3.2 oz (54.1 kg)  12/19/19 137 lb 9.6 oz (62.4 kg)  11/07/19 137 lb (62.1 kg)      Other studies Reviewed: Additional studies/ records that were reviewed today include:   Echocardiogram 07/2017 Study Conclusions  - Left ventricle: The cavity size was normal. Wall thickness was   normal. Systolic function was normal. The estimated ejection   fraction was in the range of 55% to 60%. Wall motion was normal;   there were no regional wall  motion abnormalities. Doppler   parameters are consistent with elevated mean left atrial filling   pressure. - Right ventricle: The cavity size was moderately dilated. Systolic   function was mildly reduced. - Right atrium: The atrium was mildly dilated. - Tricuspid valve: There was moderate regurgitation. - Pulmonary arteries: Systolic pressure was severely increased. PA   peak pressure: 77 mm Hg (S).  Left heart catheterization 2017: 1. Prox RCA to Mid RCA lesion, 70% stenosed. 2. Mid Cx lesion, 80% stenosed. 3. Ost LPDA to LPDA lesion, 70% stenosed. 4. SVG was injected . 5. was injected is normal in caliber, and is anatomically normal. 6. Prox LAD to Mid LAD lesion, 100% stenosed. 7. Ost LM to LM lesion, 70% stenosed.    Prolonged procedure, caused by failed attempt at left radial approach due to radial loop and the development of a microperforation due to diagnostic catheter/guidewire injury. Hemostasis was achieved with compression and an Ace bandage.  Saphenous vein graft to the third obtuse marginal is widely patent. The procedure was prolonged because the graft was not marked and required aortography to identify the site of origin.  Widely patent LIMA to LAD.  Nondominant moderately diseased, 70% mid right coronary artery.  Total occlusion of the proximal native LAD.  High-grade obstruction of the mid circumflex between the  large first and third obtuse marginal branch. There is also 70% stenosis in the PDA.  Normal left ventricular function and hemodynamics.  Atrial fibrillation developed transiently, lasting approximally 3 minutes. Atrial fibrillation was incited by mechanical irritation during left ventriculography.   RECOMMENDATIONS:   Watch medial left arm for evidence of bleeding/hematoma.  Monitor femoral site for evidence of bleeding.  Heparin can probably be discontinued and converted to DVT prophylaxis.     ASSESSMENT AND PLAN:  1. CAD s/p CABG in 2009: not on aspirin given need for anticoagulation. No significant angina. - Continue statin and fenofibrate - Continue metoprolol  2. HTN: BP is now well controlled.  - Continue metoprolol succinate - Continue losartan 50 mg daily.   3. HLD/hypertriglyceridemia: LDL 89  - Continue atorvastatin and fenofibrate  4. DM type 2: followed by Dr Chalmers Cater   5. History of PE: unprovoked. On coumadin. - Continue coumadin per pharmacy - she may discontinue Coumadin for spinal injection without bridging. Will have pharmacy to review.     Current medicines are reviewed at length with the patient today.  The patient does not have concerns regarding medicines.  The following changes have been made:  no change  Labs/ tests ordered today include:   No orders of the defined types were placed in this encounter.    Disposition:   FU   will follow up 6 months.  Signed, Delrick Dehart Martinique, MD  08/07/2020 2:00 PM

## 2020-08-07 ENCOUNTER — Other Ambulatory Visit: Payer: Self-pay

## 2020-08-07 ENCOUNTER — Ambulatory Visit (INDEPENDENT_AMBULATORY_CARE_PROVIDER_SITE_OTHER): Payer: Medicare PPO | Admitting: Pharmacist Clinician (PhC)/ Clinical Pharmacy Specialist

## 2020-08-07 ENCOUNTER — Encounter: Payer: Self-pay | Admitting: Cardiology

## 2020-08-07 ENCOUNTER — Ambulatory Visit: Payer: Medicare PPO | Admitting: Cardiology

## 2020-08-07 VITALS — BP 126/84 | HR 69 | Ht 63.0 in | Wt 119.2 lb

## 2020-08-07 DIAGNOSIS — I25708 Atherosclerosis of coronary artery bypass graft(s), unspecified, with other forms of angina pectoris: Secondary | ICD-10-CM

## 2020-08-07 DIAGNOSIS — I2699 Other pulmonary embolism without acute cor pulmonale: Secondary | ICD-10-CM

## 2020-08-07 DIAGNOSIS — E785 Hyperlipidemia, unspecified: Secondary | ICD-10-CM

## 2020-08-07 DIAGNOSIS — Z7901 Long term (current) use of anticoagulants: Secondary | ICD-10-CM

## 2020-08-07 DIAGNOSIS — Z951 Presence of aortocoronary bypass graft: Secondary | ICD-10-CM

## 2020-08-07 DIAGNOSIS — I10 Essential (primary) hypertension: Secondary | ICD-10-CM | POA: Diagnosis not present

## 2020-08-07 LAB — POCT INR: INR: 2.9 (ref 2.0–3.0)

## 2020-08-07 MED ORDER — FENOFIBRATE 145 MG PO TABS
145.0000 mg | ORAL_TABLET | Freq: Every evening | ORAL | 2 refills | Status: DC
Start: 2020-08-07 — End: 2020-08-12

## 2020-08-07 MED ORDER — AMLODIPINE BESYLATE 5 MG PO TABS: 5.0000 mg | ORAL_TABLET | Freq: Every day | ORAL | 3 refills | Status: DC

## 2020-08-07 NOTE — Addendum Note (Signed)
Addended by: Kathyrn Lass on: 08/07/2020 02:16 PM   Modules accepted: Orders

## 2020-08-10 NOTE — Telephone Encounter (Signed)
Patient called. Lumbar injection scheduled for November/18th.

## 2020-08-11 DIAGNOSIS — E559 Vitamin D deficiency, unspecified: Secondary | ICD-10-CM | POA: Diagnosis not present

## 2020-08-11 DIAGNOSIS — I10 Essential (primary) hypertension: Secondary | ICD-10-CM | POA: Diagnosis not present

## 2020-08-11 DIAGNOSIS — E1165 Type 2 diabetes mellitus with hyperglycemia: Secondary | ICD-10-CM | POA: Diagnosis not present

## 2020-08-11 NOTE — Telephone Encounter (Signed)
   Primary Cardiologist: Peter Martinique, MD  Chart reviewed as part of pre-operative protocol coverage. Given past medical history and time since last visit, based on ACC/AHA guidelines, Jennifer Warner would be at acceptable risk for the planned procedure without further cardiovascular testing.   Per pharmacy and Dr. Martinique recommendation okay to hold warfarin 5 days prior to procedure. Patient will not need bridging with Lovenox around procedure.   The patient was advised that if she develops new symptoms prior to surgery to contact our office to arrange for a follow-up visit, and she verbalized understanding. If not bridging, patient should restart warfarin on the evening of procedure, at discretion of procedure MD  I will route this recommendation to the requesting party via [facility] fax function and remove from pre-op pool.  Please call with questions.  Jeanann Balinski Ninfa Meeker, PA-C 08/11/2020, 1:29 PM

## 2020-08-11 NOTE — Telephone Encounter (Signed)
Patient with diagnosis of pulmonary embolism on warfarin for anticoagulation.    Procedure: lumbar injection Date of procedure: Nov 3 (pt called [facility] to note appt moved from Nov 18 to Nov 3)  CrCl 28.7 Platelet count 187 (Oct 2018)  Per office protocol, patient can hold warfarin for 5 days prior to procedure.   Patient will not need bridging with Lovenox (enoxaparin) around procedure.  If not bridging, patient should restart warfarin on the evening of procedure, at discretion of procedure MD

## 2020-08-12 ENCOUNTER — Other Ambulatory Visit: Payer: Self-pay | Admitting: Pharmacist Clinician (PhC)/ Clinical Pharmacy Specialist

## 2020-08-12 MED ORDER — FENOFIBRATE 145 MG PO TABS
145.0000 mg | ORAL_TABLET | Freq: Every evening | ORAL | 2 refills | Status: DC
Start: 2020-08-12 — End: 2020-08-12

## 2020-08-12 MED ORDER — FENOFIBRATE 145 MG PO TABS: 145.0000 mg | ORAL_TABLET | Freq: Every evening | ORAL | 2 refills | Status: DC

## 2020-08-12 MED ORDER — AMLODIPINE BESYLATE 5 MG PO TABS: 5.0000 mg | ORAL_TABLET | Freq: Every day | ORAL | 3 refills | Status: DC

## 2020-08-14 DIAGNOSIS — E7800 Pure hypercholesterolemia, unspecified: Secondary | ICD-10-CM | POA: Diagnosis not present

## 2020-08-14 DIAGNOSIS — I10 Essential (primary) hypertension: Secondary | ICD-10-CM | POA: Diagnosis not present

## 2020-08-14 DIAGNOSIS — E1165 Type 2 diabetes mellitus with hyperglycemia: Secondary | ICD-10-CM | POA: Diagnosis not present

## 2020-08-14 DIAGNOSIS — N390 Urinary tract infection, site not specified: Secondary | ICD-10-CM | POA: Diagnosis not present

## 2020-08-18 DIAGNOSIS — N189 Chronic kidney disease, unspecified: Secondary | ICD-10-CM | POA: Diagnosis not present

## 2020-08-18 DIAGNOSIS — I10 Essential (primary) hypertension: Secondary | ICD-10-CM | POA: Diagnosis not present

## 2020-08-18 DIAGNOSIS — E7800 Pure hypercholesterolemia, unspecified: Secondary | ICD-10-CM | POA: Diagnosis not present

## 2020-08-18 DIAGNOSIS — E559 Vitamin D deficiency, unspecified: Secondary | ICD-10-CM | POA: Diagnosis not present

## 2020-08-18 DIAGNOSIS — N1831 Chronic kidney disease, stage 3a: Secondary | ICD-10-CM | POA: Diagnosis not present

## 2020-08-18 DIAGNOSIS — E1165 Type 2 diabetes mellitus with hyperglycemia: Secondary | ICD-10-CM | POA: Diagnosis not present

## 2020-08-19 ENCOUNTER — Other Ambulatory Visit: Payer: Self-pay

## 2020-08-19 ENCOUNTER — Ambulatory Visit (INDEPENDENT_AMBULATORY_CARE_PROVIDER_SITE_OTHER): Payer: Medicare PPO

## 2020-08-19 DIAGNOSIS — Z7901 Long term (current) use of anticoagulants: Secondary | ICD-10-CM

## 2020-08-19 DIAGNOSIS — I2699 Other pulmonary embolism without acute cor pulmonale: Secondary | ICD-10-CM | POA: Diagnosis not present

## 2020-08-19 LAB — POCT INR: INR: 1.1 — AB (ref 2.0–3.0)

## 2020-08-19 NOTE — Patient Instructions (Signed)
Continue with 1.5 tablets daily except 1 tablet each Sunday, Tuesday and Thursday. Repeat INR in 1 week. Call us with any questions [phone]. Pt having Lumbar Injection 11/4

## 2020-08-20 DIAGNOSIS — M5416 Radiculopathy, lumbar region: Secondary | ICD-10-CM | POA: Diagnosis not present

## 2020-08-26 ENCOUNTER — Other Ambulatory Visit: Payer: Self-pay

## 2020-08-26 DIAGNOSIS — Z7901 Long term (current) use of anticoagulants: Secondary | ICD-10-CM

## 2020-08-27 ENCOUNTER — Other Ambulatory Visit: Payer: Self-pay

## 2020-08-27 DIAGNOSIS — I200 Unstable angina: Secondary | ICD-10-CM

## 2020-08-27 DIAGNOSIS — Z7901 Long term (current) use of anticoagulants: Secondary | ICD-10-CM | POA: Diagnosis not present

## 2020-08-28 LAB — PROTIME-INR
INR: 1.2 (ref 0.9–1.2)
Prothrombin Time: 12.9 s — ABNORMAL HIGH (ref 9.1–12.0)

## 2020-08-31 ENCOUNTER — Ambulatory Visit (INDEPENDENT_AMBULATORY_CARE_PROVIDER_SITE_OTHER): Payer: Medicare PPO | Admitting: Internal Medicine

## 2020-08-31 DIAGNOSIS — I2699 Other pulmonary embolism without acute cor pulmonale: Secondary | ICD-10-CM | POA: Diagnosis not present

## 2020-08-31 DIAGNOSIS — Z5181 Encounter for therapeutic drug level monitoring: Secondary | ICD-10-CM | POA: Diagnosis not present

## 2020-08-31 DIAGNOSIS — Z7901 Long term (current) use of anticoagulants: Secondary | ICD-10-CM

## 2020-09-07 ENCOUNTER — Telehealth: Payer: Self-pay

## 2020-09-07 NOTE — Telephone Encounter (Signed)
I spoke to patient in regards to rescheduling Coumadin appointment from today 11/22 to 11/24 Wednesday.  She is not feeling well and will call Wednesday morning, if she cannot make appointment @ 12:30.

## 2020-09-09 ENCOUNTER — Ambulatory Visit (INDEPENDENT_AMBULATORY_CARE_PROVIDER_SITE_OTHER): Payer: Medicare PPO

## 2020-09-09 ENCOUNTER — Other Ambulatory Visit: Payer: Self-pay

## 2020-09-09 DIAGNOSIS — I2699 Other pulmonary embolism without acute cor pulmonale: Secondary | ICD-10-CM

## 2020-09-09 DIAGNOSIS — Z7901 Long term (current) use of anticoagulants: Secondary | ICD-10-CM

## 2020-09-09 LAB — POCT INR: INR: 2.7 (ref 2.0–3.0)

## 2020-09-09 NOTE — Patient Instructions (Signed)
Continue with 1.5 tablets daily except 1 tablet each Sunday, Tuesday and Thursday. Repeat INR in 6 weeks. Call us with any questions [phone].

## 2020-09-25 ENCOUNTER — Other Ambulatory Visit: Payer: Self-pay | Admitting: Cardiology

## 2020-10-07 ENCOUNTER — Telehealth: Payer: Self-pay | Admitting: Cardiology

## 2020-10-07 MED ORDER — WARFARIN SODIUM 1 MG PO TABS
ORAL_TABLET | ORAL | 1 refills | Status: DC
Start: 2020-10-07 — End: 2021-01-07

## 2020-10-07 NOTE — Telephone Encounter (Signed)
I spoke to patient and received her updated pharmacy information.  Refilled Warfarin.

## 2020-10-07 NOTE — Telephone Encounter (Signed)
Caitland is calling requesting to speak with Frederik Schmidt or a nurse in regards to her medications. She did not want to tell me anything further so I could try to assist her. Please advise.

## 2020-10-14 DIAGNOSIS — N189 Chronic kidney disease, unspecified: Secondary | ICD-10-CM | POA: Diagnosis not present

## 2020-10-14 DIAGNOSIS — E1165 Type 2 diabetes mellitus with hyperglycemia: Secondary | ICD-10-CM | POA: Diagnosis not present

## 2020-10-14 DIAGNOSIS — E7800 Pure hypercholesterolemia, unspecified: Secondary | ICD-10-CM | POA: Diagnosis not present

## 2020-10-14 DIAGNOSIS — N390 Urinary tract infection, site not specified: Secondary | ICD-10-CM | POA: Diagnosis not present

## 2020-10-30 ENCOUNTER — Ambulatory Visit (INDEPENDENT_AMBULATORY_CARE_PROVIDER_SITE_OTHER): Payer: Medicare Other | Admitting: Pharmacist Clinician (PhC)/ Clinical Pharmacy Specialist

## 2020-10-30 ENCOUNTER — Other Ambulatory Visit: Payer: Self-pay

## 2020-10-30 DIAGNOSIS — I2699 Other pulmonary embolism without acute cor pulmonale: Secondary | ICD-10-CM | POA: Diagnosis not present

## 2020-10-30 DIAGNOSIS — Z7901 Long term (current) use of anticoagulants: Secondary | ICD-10-CM

## 2020-10-30 LAB — POCT INR: INR: 2.2 (ref 2.0–3.0)

## 2020-11-21 ENCOUNTER — Other Ambulatory Visit: Payer: Self-pay | Admitting: Cardiology

## 2020-12-09 ENCOUNTER — Other Ambulatory Visit: Payer: Self-pay | Admitting: Cardiology

## 2020-12-11 ENCOUNTER — Ambulatory Visit (INDEPENDENT_AMBULATORY_CARE_PROVIDER_SITE_OTHER): Payer: Medicare Other

## 2020-12-11 ENCOUNTER — Other Ambulatory Visit: Payer: Self-pay

## 2020-12-11 DIAGNOSIS — Z7901 Long term (current) use of anticoagulants: Secondary | ICD-10-CM

## 2020-12-11 DIAGNOSIS — I2699 Other pulmonary embolism without acute cor pulmonale: Secondary | ICD-10-CM | POA: Diagnosis not present

## 2020-12-11 LAB — POCT INR: INR: 1.6 — AB (ref 2.0–3.0)

## 2020-12-11 NOTE — Patient Instructions (Signed)
Take 2.5 tablets tonight only and then Continue with 1.5 tablets daily except 1 tablet each Sunday, Tuesday and Thursday. Repeat INR in 3 weeks. Call us with any questions [phone].

## 2021-01-01 ENCOUNTER — Ambulatory Visit (INDEPENDENT_AMBULATORY_CARE_PROVIDER_SITE_OTHER): Payer: Medicare Other

## 2021-01-01 ENCOUNTER — Other Ambulatory Visit: Payer: Self-pay

## 2021-01-01 DIAGNOSIS — Z7901 Long term (current) use of anticoagulants: Secondary | ICD-10-CM | POA: Diagnosis not present

## 2021-01-01 DIAGNOSIS — I2699 Other pulmonary embolism without acute cor pulmonale: Secondary | ICD-10-CM | POA: Diagnosis not present

## 2021-01-01 LAB — POCT INR: INR: 1.2 — AB (ref 2.0–3.0)

## 2021-01-01 NOTE — Patient Instructions (Signed)
Take 3 tablets tonight only and then increase to 1.5 tablets daily except 1 tablet each Wednesday. Repeat INR in 2 weeks. Call us with any questions [phone].

## 2021-01-07 ENCOUNTER — Other Ambulatory Visit: Payer: Self-pay | Admitting: Cardiology

## 2021-01-15 ENCOUNTER — Other Ambulatory Visit: Payer: Self-pay

## 2021-01-15 ENCOUNTER — Ambulatory Visit (INDEPENDENT_AMBULATORY_CARE_PROVIDER_SITE_OTHER): Payer: Medicare Other

## 2021-01-15 DIAGNOSIS — Z7901 Long term (current) use of anticoagulants: Secondary | ICD-10-CM | POA: Diagnosis not present

## 2021-01-15 DIAGNOSIS — I2699 Other pulmonary embolism without acute cor pulmonale: Secondary | ICD-10-CM

## 2021-01-15 LAB — POCT INR: INR: 1.5 — AB (ref 2.0–3.0)

## 2021-01-15 NOTE — Patient Instructions (Signed)
Take 3 tablets tonight only and then increase to 1.5 tablets daily except 2 tablets on Wednesday. Repeat INR in 4 weeks. Call us with any questions [phone].

## 2021-02-12 ENCOUNTER — Ambulatory Visit (INDEPENDENT_AMBULATORY_CARE_PROVIDER_SITE_OTHER): Payer: Medicare Other

## 2021-02-12 ENCOUNTER — Other Ambulatory Visit: Payer: Self-pay

## 2021-02-12 DIAGNOSIS — I2699 Other pulmonary embolism without acute cor pulmonale: Secondary | ICD-10-CM

## 2021-02-12 DIAGNOSIS — Z7901 Long term (current) use of anticoagulants: Secondary | ICD-10-CM

## 2021-02-12 LAB — POCT INR: INR: 1.7 — AB (ref 2.0–3.0)

## 2021-02-12 NOTE — Patient Instructions (Signed)
Take 3 tablets tonight only and then increase to 1.5 tablets daily except 2 tablets on Monday, Wednesday and Friday. Repeat INR in 4 weeks. Call us with any questions [phone].

## 2021-02-13 NOTE — Progress Notes (Signed)
Cardiology Office Note   Date:  02/15/2021   ID:  Jennifer, Warner Apr 01, 1934, MRN [id number]  PCP:  Merrilee Seashore, MD  Cardiologist:  Letcher Schweikert Martinique, MD EP: None  Chief Complaint  Patient presents with  . Coronary Artery Disease      History of Present Illness: Jennifer Warner is a 87 y.o. female is see for follow up CAD. She has  a PMH of CAD s/p CABG in 2009 (LIMA to LAD, SVG to left posterolateral branch of the LCx), HTN, HLD, DM type 2 on insulin, PE on coumadin.  She was admitted in November 2018 for submassive PE with right heart strain though not a candidate for thrombectomy/thombolysis, for which she was started on coumadin. Her aspirin was discontinued at that time to minimize bleeding risk. No other medication changes occurred at that visit. Her last ischemic evaluation was a LHC in 2017 which showed patent LIMA to LAD and SVG to OM, occluded LAD, moderate nondominant RCA disease, high grade obstruction of mLCx between 1st-3rd OM branch, and moderate PDA disease which was medically managed. Her last echo was 07/2017 which showed EF 55-60%, no RWMA, mild RV systolic dysfunction and severely increased PA pressures (in the setting of PE), and moderate TR.   She feels well from a cardiac standpoint. No chest pain or dyspnea. No palpitations or edema. She is having problems with sciatica. A steroid injection did not help. She is now living in [name], [place].    Past Medical History:  Diagnosis Date  . Acute thyroiditis   . CAD (coronary artery disease)   . CAP (community acquired pneumonia) 12/2012  . Essential hypertension, benign   . GERD (gastroesophageal reflux disease)   . History of blood transfusion 05/2008   "when I had my bypass"  . Hyperkalemia   . Hypertriglyceridemia   . Other secondary thrombocytopenia   . Polycystic ovaries   . Postsurgical aortocoronary bypass status   . Pure hypercholesterolemia   . Type II diabetes mellitus ([place])   .  Urinary tract infection, site not specified     Past Surgical History:  Procedure Laterality Date  . CARDIAC CATHETERIZATION  05/2008  . CARDIAC CATHETERIZATION N/A 05/03/2016   Procedure: Left Heart Cath and Coronary Angiography;  Surgeon: Belva Crome, MD;  Location: [facility];  Service: Cardiovascular;  Laterality: N/A;  . CATARACT EXTRACTION W/ INTRAOCULAR LENS  IMPLANT, BILATERAL Bilateral ~ 2000  . [place]  . CORONARY ARTERY BYPASS GRAFT  05/2008   X2, [place] to LAD, SVG to PDA  . DILATION AND CURETTAGE OF UTERUS    . LAPAROSCOPIC CHOLECYSTECTOMY  1990s  . TUBAL LIGATION       Current Outpatient Medications  Medication Sig Dispense Refill  . amLODipine (NORVASC) 5 MG tablet Take 1 tablet (5 mg total) by mouth daily. 90 tablet 3  . atorvastatin (LIPITOR) 20 MG tablet TAKE 1 TABLET BY MOUTH ONCE DAILY AT  6  PM 90 tablet 3  . diphenhydramine-acetaminophen (TYLENOL PM) 25-500 MG TABS Take 2 tablets by mouth at bedtime.    . fenofibrate (TRICOR) 145 MG tablet Take 1 tablet (145 mg total) by mouth every evening. 90 tablet 2  . hydrochlorothiazide (MICROZIDE) 12.5 MG capsule TAKE 1 CAPSULE BY MOUTH DAILY 90 capsule 1  . Insulin Glargine (BASAGLAR KWIKPEN) 100 UNIT/ML Inject into the skin daily.    Marland Kitchen LANTUS SOLOSTAR 100 UNIT/ML injection Inject 20 Units into the skin at  bedtime.     . metoprolol tartrate (LOPRESSOR) 25 MG tablet Take 1 tablet by mouth twice daily 180 tablet 3  . nitroGLYCERIN (NITROSTAT) 0.4 MG SL tablet Place 1 tablet (0.4 mg total) under the tongue every 5 (five) minutes x 3 doses as needed for chest pain. 25 tablet 2  . omeprazole (PRILOSEC) 20 MG capsule Take 1 capsule (20 mg total) by mouth daily. 90 capsule 2  . valsartan (DIOVAN) 160 MG tablet TAKE 1 TABLET BY MOUTH EVERY DAY *REPLACES LOSARTAN* 90 tablet 1  . Vitamin D, Ergocalciferol, 50 MCG (2000 UT) CAPS Take 50,000 Units by mouth daily.   1  . warfarin (COUMADIN) 1 MG tablet TAKE 1 TO 2  TABLETS BY MOUTH ONCE DAILY OR AS PRESCIBED BY CLINIC 60 tablet 1   No current facility-administered medications for this visit.    Allergies:   Levofloxacin    Social History:  The patient  reports that she has never smoked. She has never used smokeless tobacco. She reports that she does not drink alcohol and does not use drugs.   Family History:  The patient's family history includes Cancer in her brother; Liver disease (age of onset: 29) in her mother; Stroke in her brother.    ROS:  Please see the history of present illness.   Otherwise, review of systems are positive for none.   All other systems are reviewed and negative.    PHYSICAL EXAM: VS:  BP (!) 146/80 (BP Location: Left Arm, Patient Position: Sitting, Cuff Size: Normal)   Pulse 68   Ht 5\' 3"  (1.6 m)   Wt 117 lb (53.1 kg)   BMI 20.73 kg/m  , BMI Body mass index is 20.73 kg/m. GEN: Well nourished, well developed, in no acute distress HEENT: sclera anicteric, small scar on occiput. No significant hematoma. Neck: no JVD, carotid bruits, or masses Cardiac: RRR; no murmurs, rubs, or gallops, no edema  Respiratory:  clear to auscultation bilaterally, normal work of breathing GI: soft, nontender, nondistended, + BS MS: no deformity or atrophy Skin: warm and dry, no rash Neuro:  Strength and sensation are intact. Alert and oriented x 3.  Psych: euthymic mood, full affect   EKG:  EKG is not  ordered today.    Recent Labs: 04/16/2020: BUN 39; Creatinine, Ser 1.44; Potassium 5.0; Sodium 141    Lipid Panel    Component Value Date/Time   CHOL 165 08/23/2019 1023   TRIG 228 (H) 08/23/2019 1023   HDL 38 (L) 08/23/2019 1023   CHOLHDL 4.3 08/23/2019 1023   CHOLHDL 5.1 08/15/2017 0401   VLDL 56 (H) 08/15/2017 0401   LDLCALC 89 08/23/2019 1023    Dated 08/11/20: normal LFTs Dated 10/14/20: cholesterol 157, triglycerides 219, HDL 44, LDL 77.  Dated 11/18/20: BUN 26, creatinine 1.44. otherwise BMET normal.   Wt Readings  from Last 3 Encounters:  02/15/21 117 lb (53.1 kg)  08/07/20 119 lb 3.2 oz (54.1 kg)  12/19/19 137 lb 9.6 oz (62.4 kg)      Other studies Reviewed: Additional studies/ records that were reviewed today include:   Echocardiogram 07/2017 Study Conclusions  - Left ventricle: The cavity size was normal. Wall thickness was   normal. Systolic function was normal. The estimated ejection   fraction was in the range of 55% to 60%. Wall motion was normal;   there were no regional wall motion abnormalities. Doppler   parameters are consistent with elevated mean left atrial filling   pressure. -  Right ventricle: The cavity size was moderately dilated. Systolic   function was mildly reduced. - Right atrium: The atrium was mildly dilated. - Tricuspid valve: There was moderate regurgitation. - Pulmonary arteries: Systolic pressure was severely increased. PA   peak pressure: 77 mm Hg (S).  Left heart catheterization 2017: 1. Prox RCA to Mid RCA lesion, 70% stenosed. 2. Mid Cx lesion, 80% stenosed. 3. Ost LPDA to LPDA lesion, 70% stenosed. 4. SVG was injected . 5. was injected is normal in caliber, and is anatomically normal. 6. Prox LAD to Mid LAD lesion, 100% stenosed. 7. Ost LM to LM lesion, 70% stenosed.    Prolonged procedure, caused by failed attempt at left radial approach due to radial loop and the development of a microperforation due to diagnostic catheter/guidewire injury. Hemostasis was achieved with compression and an Ace bandage.  Saphenous vein graft to the third obtuse marginal is widely patent. The procedure was prolonged because the graft was not marked and required aortography to identify the site of origin.  Widely patent LIMA to LAD.  Nondominant moderately diseased, 70% mid right coronary artery.  Total occlusion of the proximal native LAD.  High-grade obstruction of the mid circumflex between the large first and third obtuse marginal branch. There is also 70%  stenosis in the PDA.  Normal left ventricular function and hemodynamics.  Atrial fibrillation developed transiently, lasting approximally 3 minutes. Atrial fibrillation was incited by mechanical irritation during left ventriculography.   RECOMMENDATIONS:   Watch medial left arm for evidence of bleeding/hematoma.  Monitor femoral site for evidence of bleeding.  Heparin can probably be discontinued and converted to DVT prophylaxis.     ASSESSMENT AND PLAN:  1. CAD s/p CABG in 2009: not on aspirin given need for anticoagulation.  - cardiac cath in 2017 showed patent grafts.  - No significant angina. - Continue statin and fenofibrate - Continue metoprolol  2. HTN: BP is now well controlled.  - Continue metoprolol succinate - Continue losartan 50 mg daily.   3. HLD/hypertriglyceridemia: LDL 77 - Continue atorvastatin and fenofibrate  4. DM type 2: followed by Dr Chalmers Cater   5. History of PE: unprovoked. On coumadin. - we've had some difficulty getting INR in therapeutic range. - will discuss with pharmacy. If affordable would like to switch to NOAC.   Disposition:   FU   will follow up 6 months.  Signed, Jennet Scroggin Martinique, MD  02/15/2021 1:31 PM

## 2021-02-15 ENCOUNTER — Encounter: Payer: Self-pay | Admitting: Cardiology

## 2021-02-15 ENCOUNTER — Other Ambulatory Visit: Payer: Self-pay

## 2021-02-15 ENCOUNTER — Ambulatory Visit (INDEPENDENT_AMBULATORY_CARE_PROVIDER_SITE_OTHER): Payer: Medicare Other | Admitting: Cardiology

## 2021-02-15 VITALS — BP 146/80 | HR 68 | Ht 63.0 in | Wt 117.0 lb

## 2021-02-15 DIAGNOSIS — I2699 Other pulmonary embolism without acute cor pulmonale: Secondary | ICD-10-CM

## 2021-02-15 DIAGNOSIS — E785 Hyperlipidemia, unspecified: Secondary | ICD-10-CM | POA: Diagnosis not present

## 2021-02-15 DIAGNOSIS — Z951 Presence of aortocoronary bypass graft: Secondary | ICD-10-CM | POA: Diagnosis not present

## 2021-02-15 DIAGNOSIS — I25708 Atherosclerosis of coronary artery bypass graft(s), unspecified, with other forms of angina pectoris: Secondary | ICD-10-CM | POA: Diagnosis not present

## 2021-02-15 DIAGNOSIS — I10 Essential (primary) hypertension: Secondary | ICD-10-CM

## 2021-02-15 MED ORDER — WARFARIN SODIUM 1 MG PO TABS: ORAL_TABLET | ORAL | 1 refills | Status: DC

## 2021-02-22 ENCOUNTER — Telehealth: Payer: Self-pay

## 2021-02-22 NOTE — Telephone Encounter (Signed)
*  Patient unable to pass urine and when some passes if mainly blood, also has pain 7/10*  Patient instructed to HOLD warfarin NOW and go to nearest emergency room.  Patient stated she will get ready "right now" and go to emergency room.

## 2021-02-22 NOTE — Telephone Encounter (Signed)
Called in and stated having hematuria will route to [facility]

## 2021-02-23 ENCOUNTER — Ambulatory Visit (INDEPENDENT_AMBULATORY_CARE_PROVIDER_SITE_OTHER): Payer: Medicare Other | Admitting: Pharmacist

## 2021-02-23 DIAGNOSIS — Z7901 Long term (current) use of anticoagulants: Secondary | ICD-10-CM

## 2021-02-23 DIAGNOSIS — I2699 Other pulmonary embolism without acute cor pulmonale: Secondary | ICD-10-CM

## 2021-02-26 ENCOUNTER — Other Ambulatory Visit: Payer: Self-pay

## 2021-02-26 ENCOUNTER — Ambulatory Visit (INDEPENDENT_AMBULATORY_CARE_PROVIDER_SITE_OTHER): Payer: Medicare Other

## 2021-02-26 DIAGNOSIS — I2699 Other pulmonary embolism without acute cor pulmonale: Secondary | ICD-10-CM | POA: Diagnosis not present

## 2021-02-26 DIAGNOSIS — Z7901 Long term (current) use of anticoagulants: Secondary | ICD-10-CM | POA: Diagnosis not present

## 2021-02-26 LAB — POCT INR: INR: 1.9 — AB (ref 2.0–3.0)

## 2021-02-26 NOTE — Patient Instructions (Signed)
Take 2.5 tablets tonight only and then Continue 1.5 tablets daily except 2 tablets on Monday, Wednesday and Friday. Repeat INR in 2 weeks. Call us with any questions [phone].

## 2021-03-17 ENCOUNTER — Ambulatory Visit (INDEPENDENT_AMBULATORY_CARE_PROVIDER_SITE_OTHER): Payer: Medicare Other

## 2021-03-17 ENCOUNTER — Other Ambulatory Visit: Payer: Self-pay

## 2021-03-17 DIAGNOSIS — Z7901 Long term (current) use of anticoagulants: Secondary | ICD-10-CM

## 2021-03-17 DIAGNOSIS — I2699 Other pulmonary embolism without acute cor pulmonale: Secondary | ICD-10-CM | POA: Diagnosis not present

## 2021-03-17 LAB — POCT INR: INR: 2.4 (ref 2.0–3.0)

## 2021-03-17 NOTE — Patient Instructions (Signed)
Continue 1.5 tablets daily except 2 tablets on Monday, Wednesday and Friday. Repeat INR in 6 weeks. Call us with any questions [phone].  Patient will discuss Eliquis at next visit.

## 2021-04-28 ENCOUNTER — Ambulatory Visit (INDEPENDENT_AMBULATORY_CARE_PROVIDER_SITE_OTHER): Payer: Medicare Other

## 2021-04-28 ENCOUNTER — Other Ambulatory Visit: Payer: Self-pay

## 2021-04-28 DIAGNOSIS — Z7901 Long term (current) use of anticoagulants: Secondary | ICD-10-CM | POA: Diagnosis not present

## 2021-04-28 DIAGNOSIS — I2699 Other pulmonary embolism without acute cor pulmonale: Secondary | ICD-10-CM

## 2021-04-28 LAB — POCT INR: INR: 1.8 — AB (ref 2.0–3.0)

## 2021-04-28 MED ORDER — APIXABAN 2.5 MG PO TABS: 2.5000 mg | ORAL_TABLET | Freq: Two times a day (BID) | ORAL | 1 refills | Status: DC

## 2021-04-28 NOTE — Patient Instructions (Addendum)
Transitioning to Eliquis 2.5mg  [phone]

## 2021-05-12 ENCOUNTER — Telehealth: Payer: Self-pay

## 2021-05-12 NOTE — Telephone Encounter (Signed)
I spoke to the patient and gave her Eliquis instructions 2.5 mg bid.  She verbalized understanding

## 2021-05-13 ENCOUNTER — Telehealth: Payer: Self-pay | Admitting: Pharmacist

## 2021-05-13 DIAGNOSIS — E785 Hyperlipidemia, unspecified: Secondary | ICD-10-CM

## 2021-05-13 MED ORDER — FENOFIBRATE 145 MG PO TABS: 145.0000 mg | ORAL_TABLET | Freq: Every evening | ORAL | 2 refills | Status: DC

## 2021-05-13 NOTE — Telephone Encounter (Signed)
Patient requests refill of fenofibrate sent to [facility]

## 2021-07-05 ENCOUNTER — Other Ambulatory Visit: Payer: Self-pay | Admitting: Cardiovascular Disease

## 2021-07-05 NOTE — Telephone Encounter (Signed)
Prescription refill request for Eliquis received. Last office visit:JORDAN 02/15/21 Scr:1.15 10/14/20 Age: 89F Weight:53.1KG

## 2021-11-22 ENCOUNTER — Other Ambulatory Visit: Payer: Self-pay

## 2021-11-22 MED ORDER — ATORVASTATIN CALCIUM 20 MG PO TABS: ORAL_TABLET | ORAL | 0 refills | Status: DC

## 2021-11-22 MED ORDER — AMLODIPINE BESYLATE 5 MG PO TABS: 5.0000 mg | ORAL_TABLET | Freq: Every day | ORAL | 0 refills | Status: DC

## 2022-01-27 ENCOUNTER — Other Ambulatory Visit: Payer: Self-pay | Admitting: Cardiology

## 2022-02-16 ENCOUNTER — Telehealth: Payer: Self-pay | Admitting: Cardiology

## 2022-02-16 ENCOUNTER — Other Ambulatory Visit: Payer: Self-pay | Admitting: Cardiology

## 2022-02-16 DIAGNOSIS — I2699 Other pulmonary embolism without acute cor pulmonale: Secondary | ICD-10-CM

## 2022-02-16 NOTE — Telephone Encounter (Signed)
Returned call to patient, advised Jennifer Warner is out of office today but will return tomorrow.  ?

## 2022-02-16 NOTE — Telephone Encounter (Signed)
Patient called to speak to [facility]. She was not happy when she was told that she would have to wait until August for her appointment with Dr. Martinique.  ?

## 2022-02-16 NOTE — Telephone Encounter (Signed)
Prescription refill request for Eliquis received. ?Indication:PE ?Last office visit:needs appointment ?PYK:DXIPJ labs ?Age: 88 ?Weight:53.1 kg ? ?Prescription refilled ? ?

## 2022-02-17 NOTE — Telephone Encounter (Signed)
Spoke to patient she stated she would like a sooner appointment with Dr.Jordan.Appointment scheduled 5/26 at 11:50 am. ?

## 2022-02-21 ENCOUNTER — Other Ambulatory Visit: Payer: Self-pay

## 2022-02-21 MED ORDER — FENOFIBRATE 145 MG PO TABS: 145.0000 mg | ORAL_TABLET | Freq: Every evening | ORAL | 1 refills | Status: DC

## 2022-02-22 ENCOUNTER — Other Ambulatory Visit: Payer: Self-pay | Admitting: Cardiology

## 2022-02-22 ENCOUNTER — Telehealth: Payer: Self-pay | Admitting: Cardiology

## 2022-02-22 NOTE — Telephone Encounter (Signed)
?*  STAT* If patient is at the pharmacy, call can be transferred to refill team. ? ? ?1. Which medications need to be refilled? (please list name of each medication and dose if known) apixaban (ELIQUIS) 2.5 MG TABS tablet ? ?fenofibrate (TRICOR) 145 MG tablet ? ?2. Which pharmacy/location (including street and city if local pharmacy) is medication to be sent to? [place], [place] - [address] ? ?3. Do they need a 30 day or 90 day supply? 90 ? ? ?Patient has appt on 5/26 ? ?

## 2022-02-23 NOTE — Telephone Encounter (Signed)
Prescription refill request for Eliquis received. ?Indication:PE ?Last office visit:Upcoming ?JSU:[facility] ?Age: 88 ?Weight:53.1 kg ? ?Prescription refilled ? ?

## 2022-02-25 NOTE — Telephone Encounter (Signed)
Refills already sent to pharmacy 02/21/22 and 02/23/22.  ?

## 2022-02-28 ENCOUNTER — Telehealth: Payer: Self-pay

## 2022-02-28 MED ORDER — APIXABAN 2.5 MG PO TABS: 2.5000 mg | ORAL_TABLET | Freq: Two times a day (BID) | ORAL | 1 refills | Status: DC

## 2022-03-02 NOTE — Telephone Encounter (Signed)
done

## 2022-03-04 ENCOUNTER — Other Ambulatory Visit: Payer: Self-pay | Admitting: Cardiology

## 2022-03-04 NOTE — Progress Notes (Signed)
Cardiology Office Note   Date:  03/11/2022   ID:  Jennifer Warner, DOB 1934/03/26, MRN [id number]  PCP:  Merrilee Seashore, MD  Cardiologist:  Katrina Brosh Martinique, MD EP: None  Chief Complaint  Patient presents with   Follow-up   Coronary Artery Disease      History of Present Illness: Jennifer Warner is a 88 y.o. female is see for follow up CAD. She has  a PMH of CAD s/p CABG in 2009 (LIMA to LAD, SVG to left posterolateral branch of the LCx), HTN, HLD, DM type 2 on insulin, PE on coumadin.  She was admitted in November 2018 for submassive PE with right heart strain though not a candidate for thrombectomy/thombolysis, for which she was started on coumadin. Her aspirin was discontinued at that time to minimize bleeding risk. No other medication changes occurred at that visit. Her last ischemic evaluation was a LHC in 2017 which showed patent LIMA to LAD and SVG to OM, occluded LAD, moderate nondominant RCA disease, high grade obstruction of mLCx between 1st-3rd OM branch, and moderate PDA disease which was medically managed. Her last echo was 07/2017 which showed EF 55-60%, no RWMA, mild RV systolic dysfunction and severely increased PA pressures (in the setting of PE), and moderate TR.   She feels well from a cardiac standpoint. The only episode of chest pain she had was when her sister was in a MVA and was hospitalized. No other Chest pain or SOB.  No palpitations or edema. She is having some sciatica. She is now living in [place], [place]. Is tolerating Eliquis well.    Past Medical History:  Diagnosis Date   Acute thyroiditis    CAD (coronary artery disease)    CAP (community acquired pneumonia) 12/2012   Essential hypertension, benign    GERD (gastroesophageal reflux disease)    History of blood transfusion 05/2008   "when I had my bypass"   Hyperkalemia    Hypertriglyceridemia    Other secondary thrombocytopenia    Polycystic ovaries    Postsurgical aortocoronary bypass status     Pure hypercholesterolemia    Type II diabetes mellitus (HCC)    Urinary tract infection, site not specified     Past Surgical History:  Procedure Laterality Date   CARDIAC CATHETERIZATION  05/2008   CARDIAC CATHETERIZATION N/A 05/03/2016   Procedure: Left Heart Cath and Coronary Angiography;  Surgeon: Belva Crome, MD;  Location: [facility];  Service: Cardiovascular;  Laterality: N/A;   CATARACT EXTRACTION W/ INTRAOCULAR LENS  IMPLANT, BILATERAL Bilateral ~ [place]   CORONARY ARTERY BYPASS GRAFT  05/2008   X2, [place] to LAD, SVG to PDA   DILATION AND CURETTAGE OF UTERUS     LAPAROSCOPIC CHOLECYSTECTOMY  1990s   TUBAL LIGATION       Current Outpatient Medications  Medication Sig Dispense Refill   diphenhydramine-acetaminophen (TYLENOL PM) 25-500 MG TABS Take 2 tablets by mouth at bedtime.     Insulin Glargine (BASAGLAR KWIKPEN) 100 UNIT/ML Inject into the skin daily.     LANTUS SOLOSTAR 100 UNIT/ML injection Inject 15 Units into the skin at bedtime.     nitroGLYCERIN (NITROSTAT) 0.4 MG SL tablet Place 1 tablet (0.4 mg total) under the tongue every 5 (five) minutes x 3 doses as needed for chest pain. 25 tablet 2   pantoprazole (PROTONIX) 40 MG tablet Take 40 mg by mouth every morning.     Vitamin D, Ergocalciferol,  50 MCG (2000 UT) CAPS Take 2,000 Units by mouth daily.  1   amLODipine (NORVASC) 5 MG tablet Take 1 tablet (5 mg total) by mouth daily. 90 tablet 3   apixaban (ELIQUIS) 2.5 MG TABS tablet Take 1 tablet (2.5 mg total) by mouth 2 (two) times daily. 180 tablet 3   atorvastatin (LIPITOR) 20 MG tablet Take 1 tablet (20 mg total) by mouth daily. 90 tablet 3   fenofibrate (TRICOR) 145 MG tablet Take 1 tablet (145 mg total) by mouth every evening. 90 tablet 3   hydrochlorothiazide (MICROZIDE) 12.5 MG capsule Take 1 capsule (12.5 mg total) by mouth daily. 90 capsule 3   metoprolol tartrate (LOPRESSOR) 25 MG tablet Take 1 tablet (25 mg total) by mouth 2 (two)  times daily. 180 tablet 3   No current facility-administered medications for this visit.    Allergies:   Levofloxacin    Social History:  The patient  reports that she has never smoked. She has never used smokeless tobacco. She reports that she does not drink alcohol and does not use drugs.   Family History:  The patient's family history includes Cancer in her brother; Liver disease (age of onset: 81) in her mother; Stroke in her brother.    ROS:  Please see the history of present illness.   Otherwise, review of systems are positive for none.   All other systems are reviewed and negative.    PHYSICAL EXAM: VS:  BP 124/72 (BP Location: Left Arm, Patient Position: Sitting, Cuff Size: Normal)   Pulse 65   Ht '5\' 3"'$  (1.6 m)   Wt 119 lb (54 kg)   BMI 21.08 kg/m  , BMI Body mass index is 21.08 kg/m. GEN: Well nourished, well developed, in no acute distress HEENT: sclera anicteric, small scar on occiput. No significant hematoma. Neck: no JVD, + bilateral carotid bruits, or masses Cardiac: RRR; no murmurs, rubs, or gallops, no edema  Respiratory:  clear to auscultation bilaterally, normal work of breathing GI: soft, nontender, nondistended, + BS MS: no deformity or atrophy Skin: warm and dry, no rash Neuro:  Strength and sensation are intact. Alert and oriented x 3.  Psych: euthymic mood, full affect   EKG:  EKG is  ordered today. NSR, normal Ecg. I have personally reviewed and interpreted this study.     Recent Labs: No results found for requested labs within last 8760 hours.    Lipid Panel    Component Value Date/Time   CHOL 165 08/23/2019 1023   TRIG 228 (H) 08/23/2019 1023   HDL 38 (L) 08/23/2019 1023   CHOLHDL 4.3 08/23/2019 1023   CHOLHDL 5.1 08/15/2017 0401   VLDL 56 (H) 08/15/2017 0401   LDLCALC 89 08/23/2019 1023    Dated 08/11/20: normal LFTs Dated 10/14/20: cholesterol 157, triglycerides 219, HDL 44, LDL 77.  Dated 11/18/20: BUN 26, creatinine 1.44. otherwise  BMET normal.  Dated 08/11/21: cholesterol 140, triglycerides 230, HDL 38, nonHDL 102.   Wt Readings from Last 3 Encounters:  03/11/22 119 lb (54 kg)  02/15/21 117 lb (53.1 kg)  08/07/20 119 lb 3.2 oz (54.1 kg)      Other studies Reviewed: Additional studies/ records that were reviewed today include:   Echocardiogram 07/2017 Study Conclusions   - Left ventricle: The cavity size was normal. Wall thickness was   normal. Systolic function was normal. The estimated ejection   fraction was in the range of 55% to 60%. Wall motion was normal;  there were no regional wall motion abnormalities. Doppler   parameters are consistent with elevated mean left atrial filling   pressure. - Right ventricle: The cavity size was moderately dilated. Systolic   function was mildly reduced. - Right atrium: The atrium was mildly dilated. - Tricuspid valve: There was moderate regurgitation. - Pulmonary arteries: Systolic pressure was severely increased. PA   peak pressure: 77 mm Hg (S).  Left heart catheterization 2017: Prox RCA to Mid RCA lesion, 70% stenosed. Mid Cx lesion, 80% stenosed. Ost LPDA to LPDA lesion, 70% stenosed. SVG was injected . was injected is normal in caliber, and is anatomically normal. Prox LAD to Mid LAD lesion, 100% stenosed. Ost LM to LM lesion, 70% stenosed.   Prolonged procedure, caused by failed attempt at left radial approach due to radial loop and the development of a microperforation due to diagnostic catheter/guidewire injury. Hemostasis was achieved with compression and an Ace bandage. Saphenous vein graft to the third obtuse marginal is widely patent. The procedure was prolonged because the graft was not marked and required aortography to identify the site of origin. Widely patent LIMA to LAD. Nondominant moderately diseased, 70% mid right coronary artery. Total occlusion of the proximal native LAD. High-grade obstruction of the mid circumflex between the large  first and third obtuse marginal branch. There is also 70% stenosis in the PDA. Normal left ventricular function and hemodynamics. Atrial fibrillation developed transiently, lasting approximally 3 minutes. Atrial fibrillation was incited by mechanical irritation during left ventriculography.     RECOMMENDATIONS:   Watch medial left arm for evidence of bleeding/hematoma. Monitor femoral site for evidence of bleeding. Heparin can probably be discontinued and converted to DVT prophylaxis.      ASSESSMENT AND PLAN:  1. CAD s/p CABG in 2009: not on aspirin given need for anticoagulation.  - cardiac cath in 2017 showed patent grafts.  - No significant angina. - Continue statin and fenofibrate - Continue metoprolol  2. HTN: BP is now well controlled.  - Continue metoprolol succinate - Continue amlodipine 5 mg daily.   3. HLD/hypertriglyceridemia: nonHDL 102. Elevated triglycerides  - Continue atorvastatin and fenofibrate  4. DM type 2: followed by Dr Chalmers Cater   5. History of PE: unprovoked. On coumadin initially. Later switched to Eliquis. Tolerating well. Labs followed by PCP.  6. Bilateral carotid bruits. Will check dopplers.   Disposition:   FU   will follow up one year  Signed, Deloss Amico Martinique, MD  03/11/2022 11:04 AM

## 2022-03-11 ENCOUNTER — Ambulatory Visit: Payer: Medicare Other | Admitting: Cardiology

## 2022-03-11 ENCOUNTER — Encounter: Payer: Self-pay | Admitting: Cardiology

## 2022-03-11 VITALS — BP 124/72 | HR 65 | Ht 63.0 in | Wt 119.0 lb

## 2022-03-11 DIAGNOSIS — I2699 Other pulmonary embolism without acute cor pulmonale: Secondary | ICD-10-CM

## 2022-03-11 DIAGNOSIS — I25708 Atherosclerosis of coronary artery bypass graft(s), unspecified, with other forms of angina pectoris: Secondary | ICD-10-CM

## 2022-03-11 DIAGNOSIS — R0989 Other specified symptoms and signs involving the circulatory and respiratory systems: Secondary | ICD-10-CM

## 2022-03-11 DIAGNOSIS — Z951 Presence of aortocoronary bypass graft: Secondary | ICD-10-CM

## 2022-03-11 DIAGNOSIS — I10 Essential (primary) hypertension: Secondary | ICD-10-CM

## 2022-03-11 DIAGNOSIS — E785 Hyperlipidemia, unspecified: Secondary | ICD-10-CM

## 2022-03-11 MED ORDER — APIXABAN 2.5 MG PO TABS: 2.5000 mg | ORAL_TABLET | Freq: Two times a day (BID) | ORAL | 3 refills | Status: DC

## 2022-03-11 MED ORDER — HYDROCHLOROTHIAZIDE 12.5 MG PO CAPS: 12.5000 mg | ORAL_CAPSULE | Freq: Every day | ORAL | 3 refills | Status: DC

## 2022-03-11 MED ORDER — AMLODIPINE BESYLATE 5 MG PO TABS: 5.0000 mg | ORAL_TABLET | Freq: Every day | ORAL | 3 refills | Status: DC

## 2022-03-11 MED ORDER — ATORVASTATIN CALCIUM 20 MG PO TABS: 20.0000 mg | ORAL_TABLET | Freq: Every day | ORAL | 3 refills | Status: DC

## 2022-03-11 MED ORDER — METOPROLOL TARTRATE 25 MG PO TABS: 25.0000 mg | ORAL_TABLET | Freq: Two times a day (BID) | ORAL | 3 refills | Status: DC

## 2022-03-11 MED ORDER — FENOFIBRATE 145 MG PO TABS: 145.0000 mg | ORAL_TABLET | Freq: Every evening | ORAL | 3 refills | Status: DC

## 2022-03-11 NOTE — Patient Instructions (Signed)
Medication Instructions:  Your physician recommends that you continue on your current medications as directed. Please refer to the Current Medication list given to you today.  *If you need a refill on your cardiac medications before your next appointment, please call your pharmacy*    Testing/Procedures: Your physician has requested that you have a carotid duplex. This test is an ultrasound of the carotid arteries in your neck. It looks at blood flow through these arteries that supply the brain with blood. Allow one hour for this exam. There are no restrictions or special instructions. This procedure will be done at [place]. [address]    Follow-Up: At [facility], you and your health needs are our priority.  As part of our continuing mission to provide you with exceptional heart care, we have created designated Provider Care Teams.  These Care Teams include your primary Cardiologist (physician) and Advanced Practice Providers (APPs -  Physician Assistants and Nurse Practitioners) who all work together to provide you with the care you need, when you need it.  We recommend signing up for the patient portal called "MyChart".  Sign up information is provided on this After Visit Summary.  MyChart is used to connect with patients for Virtual Visits (Telemedicine).  Patients are able to view lab/test results, encounter notes, upcoming appointments, etc.  Non-urgent messages can be sent to your provider as well.   To learn more about what you can do with MyChart, go to [email or web address].    Your next appointment:   12 month(s)  The format for your next appointment:   In Person  Provider:   Peter Martinique, MD

## 2022-03-16 NOTE — Addendum Note (Signed)
Addended by: Venetia Maxon on: 03/16/2022 09:58 AM   Modules accepted: Orders

## 2022-03-24 ENCOUNTER — Ambulatory Visit (HOSPITAL_COMMUNITY)
Admission: RE | Admit: 2022-03-24 | Discharge: 2022-03-24 | Disposition: A | Discharge status: Home / Self Care | Payer: Medicare Other | Source: Ambulatory Visit | Attending: Cardiology | Admitting: Cardiology

## 2022-03-24 DIAGNOSIS — R0989 Other specified symptoms and signs involving the circulatory and respiratory systems: Secondary | ICD-10-CM | POA: Diagnosis not present

## 2022-03-25 ENCOUNTER — Encounter: Payer: Self-pay | Admitting: *Deleted

## 2022-04-12 ENCOUNTER — Other Ambulatory Visit: Payer: Self-pay | Admitting: Cardiology

## 2022-05-05 ENCOUNTER — Other Ambulatory Visit (HOSPITAL_COMMUNITY): Payer: Self-pay | Admitting: Cardiology

## 2022-05-05 DIAGNOSIS — I6523 Occlusion and stenosis of bilateral carotid arteries: Secondary | ICD-10-CM

## 2022-06-05 ENCOUNTER — Encounter (HOSPITAL_COMMUNITY): Payer: Self-pay

## 2022-06-05 ENCOUNTER — Emergency Department (HOSPITAL_COMMUNITY)
Admission: EM | Admit: 2022-06-05 | Discharge: 2022-06-05 | Disposition: A | Discharge status: Home / Self Care | Payer: Medicare Other | Attending: Emergency Medicine | Admitting: Emergency Medicine

## 2022-06-05 ENCOUNTER — Other Ambulatory Visit: Payer: Self-pay

## 2022-06-05 ENCOUNTER — Emergency Department (HOSPITAL_COMMUNITY): Payer: Medicare Other

## 2022-06-05 DIAGNOSIS — W19XXXA Unspecified fall, initial encounter: Secondary | ICD-10-CM

## 2022-06-05 DIAGNOSIS — W01198A Fall on same level from slipping, tripping and stumbling with subsequent striking against other object, initial encounter: Secondary | ICD-10-CM | POA: Insufficient documentation

## 2022-06-05 DIAGNOSIS — Z7901 Long term (current) use of anticoagulants: Secondary | ICD-10-CM | POA: Diagnosis not present

## 2022-06-05 DIAGNOSIS — S0083XA Contusion of other part of head, initial encounter: Secondary | ICD-10-CM | POA: Insufficient documentation

## 2022-06-05 DIAGNOSIS — E041 Nontoxic single thyroid nodule: Secondary | ICD-10-CM | POA: Insufficient documentation

## 2022-06-05 DIAGNOSIS — Z951 Presence of aortocoronary bypass graft: Secondary | ICD-10-CM | POA: Insufficient documentation

## 2022-06-05 DIAGNOSIS — S0011XA Contusion of right eyelid and periocular area, initial encounter: Secondary | ICD-10-CM | POA: Diagnosis not present

## 2022-06-05 DIAGNOSIS — I129 Hypertensive chronic kidney disease with stage 1 through stage 4 chronic kidney disease, or unspecified chronic kidney disease: Secondary | ICD-10-CM | POA: Insufficient documentation

## 2022-06-05 DIAGNOSIS — N183 Chronic kidney disease, stage 3 unspecified: Secondary | ICD-10-CM | POA: Diagnosis not present

## 2022-06-05 DIAGNOSIS — E1122 Type 2 diabetes mellitus with diabetic chronic kidney disease: Secondary | ICD-10-CM | POA: Diagnosis not present

## 2022-06-05 DIAGNOSIS — I2510 Atherosclerotic heart disease of native coronary artery without angina pectoris: Secondary | ICD-10-CM | POA: Diagnosis not present

## 2022-06-05 DIAGNOSIS — Z794 Long term (current) use of insulin: Secondary | ICD-10-CM | POA: Insufficient documentation

## 2022-06-05 DIAGNOSIS — Z79899 Other long term (current) drug therapy: Secondary | ICD-10-CM | POA: Diagnosis not present

## 2022-06-05 DIAGNOSIS — S0990XA Unspecified injury of head, initial encounter: Secondary | ICD-10-CM | POA: Diagnosis present

## 2022-06-05 DIAGNOSIS — T148XXA Other injury of unspecified body region, initial encounter: Secondary | ICD-10-CM

## 2022-06-05 LAB — BASIC METABOLIC PANEL
Anion gap: 4 — ABNORMAL LOW (ref 5–15)
BUN: 28 mg/dL — ABNORMAL HIGH (ref 8–23)
CO2: 26 mmol/L (ref 22–32)
Calcium: 10.4 mg/dL — ABNORMAL HIGH (ref 8.9–10.3)
Chloride: 110 mmol/L (ref 98–111)
Creatinine, Ser: 0.98 mg/dL (ref 0.44–1.00)
GFR, Estimated: 57 mL/min — ABNORMAL LOW (ref >60)
Glucose, Bld: 107 mg/dL — ABNORMAL HIGH (ref 70–99)
Potassium: 4.2 mmol/L (ref 3.5–5.1)
Sodium: 140 mmol/L (ref 135–145)

## 2022-06-05 LAB — CBC WITH DIFFERENTIAL/PLATELET
Abs Immature Granulocytes: 0.01 10*3/uL (ref 0.00–0.07)
Basophils Absolute: 0 10*3/uL (ref 0.0–0.1)
Basophils Relative: 1 %
Eosinophils Absolute: 0.2 10*3/uL (ref 0.0–0.5)
Eosinophils Relative: 5 %
HCT: 38.2 % (ref 36.0–46.0)
Hemoglobin: 12.4 g/dL (ref 12.0–15.0)
Immature Granulocytes: 0 %
Lymphocytes Relative: 44 %
Lymphs Abs: 1.6 10*3/uL (ref 0.7–4.0)
MCH: 31.8 pg (ref 26.0–34.0)
MCHC: 32.5 g/dL (ref 30.0–36.0)
MCV: 97.9 fL (ref 80.0–100.0)
Monocytes Absolute: 0.3 10*3/uL (ref 0.1–1.0)
Monocytes Relative: 9 %
Neutro Abs: 1.5 10*3/uL — ABNORMAL LOW (ref 1.7–7.7)
Neutrophils Relative %: 41 %
Platelets: 178 10*3/uL (ref 150–400)
RBC: 3.9 MIL/uL (ref 3.87–5.11)
RDW: 13.1 % (ref 11.5–15.5)
WBC: 3.6 10*3/uL — ABNORMAL LOW (ref 4.0–10.5)
nRBC: 0 % (ref 0.0–0.2)

## 2022-06-05 LAB — PROTIME-INR
INR: 1.1 (ref 0.8–1.2)
Prothrombin Time: 13.9 seconds (ref 11.4–15.2)

## 2022-06-05 NOTE — ED Triage Notes (Signed)
Pt reports she was at church, she was standing and turned around and accidentally fell into a wall. Pt struck the right side of her head and fell into the floor. Pt reports brief loss of consciousness and states she is on Eliquis. Pt is currently alert and oriented x4. Hematoma noted to right side of forehead.

## 2022-06-05 NOTE — ED Provider Notes (Signed)
[facility] DEPT Provider Note   CSN: [id number] Arrival date & time: 06/05/22  9767     History PMH: CAD s/p CABG x 2, HLD, HTN, PE  (2018) on Eliquis, CKD stage 3, DM Chief Complaint  Patient presents with   Fall   Head Injury    Jennifer Warner is a 88 y.o. female. Patient presents to the ED after a fall.  She states that she was turning suddenly and she lost her balance and fell face forward onto the floor.  She did not have any dizziness, chest pain, or other symptom prior to the fall. She says she hit the right side of her forehead.  She does admit to having about 40 seconds worth of loss of consciousness.  She states that she woke up feeling completely at her baseline.  She denies any memory loss of the event, denies dizziness, visual disturbance, numbness or weakness of extremities, facial droop, seizure, balance issue, neck pain, or any other injuries sustained in the fall.   She has been taking her home Eliquis and last dose was last night.   Fall Associated symptoms include headaches. Pertinent negatives include no chest pain, no abdominal pain and no shortness of breath.  Head Injury Associated symptoms: headache   Associated symptoms: no nausea, no neck pain, no numbness, no seizures and no vomiting        Home Medications Prior to Admission medications   Medication Sig Start Date End Date Taking? Authorizing Provider  amLODipine (NORVASC) 5 MG tablet Take 1 tablet (5 mg total) by mouth daily. 03/11/22 03/06/23  Martinique, Peter M, MD  apixaban (ELIQUIS) 2.5 MG TABS tablet Take 1 tablet (2.5 mg total) by mouth 2 (two) times daily. NEEDS LABS FOR ELIQUIS REFILLS.  PLEASE COME TO OFFICE. 04/12/22   Martinique, Peter M, MD  atorvastatin (LIPITOR) 20 MG tablet Take 1 tablet (20 mg total) by mouth daily. 03/11/22   Martinique, Peter M, MD  diphenhydramine-acetaminophen (TYLENOL PM) 25-500 MG TABS Take 2 tablets by mouth at bedtime.    [provider]  fenofibrate (TRICOR) 145 MG tablet Take 1 tablet (145 mg total) by mouth every evening. 03/11/22   Martinique, Peter M, MD  hydrochlorothiazide (MICROZIDE) 12.5 MG capsule Take 1 capsule (12.5 mg total) by mouth daily. 03/11/22   Martinique, Peter M, MD  Insulin Glargine [facility]) 100 UNIT/ML Inject into the skin daily.    [provider]  LANTUS SOLOSTAR 100 UNIT/ML injection Inject 15 Units into the skin at bedtime. 09/22/12   [provider]  metoprolol tartrate (LOPRESSOR) 25 MG tablet Take 1 tablet (25 mg total) by mouth 2 (two) times daily. 03/11/22   Martin[place], Peter M, MD  nitroGLYCERIN (NITROSTAT) 0.4 MG SL tablet Place 1 tablet (0.4 mg total) under the tongue every 5 (five) minutes x 3 doses as needed for chest pain. 08/07/19   Kroeger, Lorelee Cover., PA-C  pantoprazole (PROTONIX) 40 MG tablet Take 40 mg by mouth every morning.    [provider]  Vitamin D, Ergocalciferol, 50 MCG (2000 UT) CAPS Take 2,000 Units by mouth daily. 04/04/16   [provider]      Allergies    Levofloxacin    Review of Systems   Review of Systems  Constitutional:  Negative for fever.  Eyes:  Negative for pain, redness and visual disturbance.  Respiratory:  Negative for shortness of breath.   Cardiovascular:  Negative for chest pain.  Gastrointestinal:  Negative for abdominal pain, nausea and vomiting.  Musculoskeletal:  Negative for back pain and neck pain.  Skin:  Positive for color change and wound.  Neurological:  Positive for headaches. Negative for dizziness, seizures, syncope, facial asymmetry, speech difficulty, weakness, light-headedness and numbness.  Psychiatric/Behavioral:  Negative for confusion.   All other systems reviewed and are negative.   Physical Exam Updated Vital Signs BP (!) 149/72   Pulse 63   Temp 98.5 F (36.9 C) (Oral)   Resp 13   Ht '5\' 3"'$  (1.6 m)   Wt 50.8 kg   SpO2 98%   BMI 19.84 kg/m  Physical Exam Vitals and nursing note  reviewed.  Constitutional:      General: She is not in acute distress.    Appearance: Normal appearance. She is not ill-appearing, toxic-appearing or diaphoretic.  HENT:     Head: Normocephalic.     Comments: Approximately 4 cm round hematoma overlying the right lateral eyebrow. No laceration noted. No pulsations overlying site.     Nose: No nasal deformity.     Mouth/Throat:     Lips: Pink. No lesions.     Mouth: Mucous membranes are moist. No injury, lacerations, oral lesions or angioedema.     Pharynx: Oropharynx is clear. Uvula midline. No pharyngeal swelling, oropharyngeal exudate, posterior oropharyngeal erythema or uvula swelling.  Eyes:     General: Gaze aligned appropriately. No scleral icterus.       Right eye: No discharge.        Left eye: No discharge.     Extraocular Movements: Extraocular movements intact.     Conjunctiva/sclera: Conjunctivae normal.     Right eye: Right conjunctiva is not injected. No exudate or hemorrhage.    Left eye: Left conjunctiva is not injected. No exudate or hemorrhage.    Pupils: Pupils are equal, round, and reactive to light.  Cardiovascular:     Rate and Rhythm: Normal rate and regular rhythm.     Pulses: Normal pulses.          Radial pulses are 2+ on the right side and 2+ on the left side.       Dorsalis pedis pulses are 2+ on the right side and 2+ on the left side.     Heart sounds: Normal heart sounds, S1 normal and S2 normal. Heart sounds not distant. No murmur heard.    No friction rub. No gallop. No S3 or S4 sounds.  Pulmonary:     Effort: Pulmonary effort is normal. No accessory muscle usage or respiratory distress.     Breath sounds: Normal breath sounds. No stridor. No wheezing, rhonchi or rales.  Chest:     Chest wall: No tenderness.  Abdominal:     General: Abdomen is flat. There is no distension.     Palpations: Abdomen is soft. There is no mass or pulsatile mass.     Tenderness: There is no abdominal tenderness. There is  no guarding or rebound.  Musculoskeletal:     Right lower leg: No edema.     Left lower leg: No edema.     Comments: No C, T, or L spine tenderness or step offs.  No other areas of deformity, swelling, or impaired ROM  Skin:    General: Skin is warm and dry.     Coloration: Skin is not jaundiced or pale.     Findings: No bruising, erythema, lesion or rash.  Neurological:     General: No focal deficit present.  Mental Status: She is alert and oriented to person, place, and time.     GCS: GCS eye subscore is 4. GCS verbal subscore is 5. GCS motor subscore is 6.     Comments: Alert and Oriented x 3 Speech clear with no aphasia Cranial Nerve testing - PERRLA. EOM intact. No Nystagmus - Facial Sensation grossly intact - No facial asymmetry - Accessory Muscles intact Motor: - 5/5 motor strength in all four extremities.  Sensation: - Grossly intact in all four extremities.  Coordination:  - Finger to nose and heel to shin intact bilaterally   Psychiatric:        Mood and Affect: Mood normal.        Behavior: Behavior normal. Behavior is cooperative.     ED Results / Procedures / Treatments   Labs (all labs ordered are listed, but only abnormal results are displayed) Labs Reviewed  BASIC METABOLIC PANEL - Abnormal; Notable for the following components:      Result Value   Glucose, Bld 107 (*)    BUN 28 (*)    Calcium 10.4 (*)    GFR, Estimated 57 (*)    Anion gap 4 (*)    All other components within normal limits  CBC WITH DIFFERENTIAL/PLATELET - Abnormal; Notable for the following components:   WBC 3.6 (*)    Neutro Abs 1.5 (*)    All other components within normal limits  PROTIME-INR    EKG EKG Interpretation  Date/Time:  Sunday June 05 2022 10:06:15 EDT Ventricular Rate:  86 PR Interval:  129 QRS Duration: 104 QT Interval:  375 QTC Calculation: 405 R Axis:   3 Text Interpretation: Sinus rhythm Paired ventricular premature complexes Probable left  ventricular hypertrophy Minimal ST elevation, lateral leads No acute changes Confirmed by Varney Biles ([id number]) on 06/05/2022 11:07:04 AM  Radiology CT Head Wo Contrast  Result Date: 06/05/2022 CLINICAL DATA:  Fall. Hematoma above the right eye. Patient on blood thinners. EXAM: CT HEAD WITHOUT CONTRAST CT MAXILLOFACIAL WITHOUT CONTRAST CT CERVICAL SPINE WITHOUT CONTRAST TECHNIQUE: Multidetector CT imaging of the head, cervical spine, and maxillofacial structures were performed using the standard protocol without intravenous contrast. Multiplanar CT image reconstructions of the cervical spine and maxillofacial structures were also generated. RADIATION DOSE REDUCTION: This exam was performed according to the departmental dose-optimization program which includes automated exposure control, adjustment of the mA and/or kV according to patient size and/or use of iterative reconstruction technique. COMPARISON:  Head CT, 11/08/2019. FINDINGS: CT HEAD FINDINGS Brain: No evidence of acute infarction, hemorrhage, hydrocephalus, extra-axial collection or mass lesion/mass effect. Vascular: No hyperdense vessel or unexpected calcification. Skull: Normal. Negative for fracture or focal lesion. Other: Large right supraorbital forehead scalp hematoma. CT MAXILLOFACIAL FINDINGS Osseous: No fracture or mandibular dislocation. No destructive process. Orbits: Negative. No traumatic or inflammatory finding. Sinuses: Clear. Soft tissues: Large right supraorbital, forehead hematoma. Bilateral carotid artery atherosclerotic calcifications. Soft tissues otherwise unremarkable. CT CERVICAL SPINE FINDINGS Alignment: Normal. Skull base and vertebrae: No acute fracture. No primary bone lesion or focal pathologic process. Soft tissues and spinal canal: No prevertebral fluid or swelling. No visible canal hematoma. Disc levels: Moderate loss of disc height at C5-C6 with mild disc bulging and endplate spurring. Remaining cervical discs are  well preserved. Facet degenerative change, most evident on the left at C3-C4 C4-C5. No convincing disc herniation. Upper chest: No acute findings.  2.2 cm left thyroid nodule. Other: None. IMPRESSION: HEAD CT 1. No acute intracranial abnormalities. 2. Large right  supraorbital/forehead scalp hematoma. No skull fracture. MAXILLOFACIAL CT 1. No fracture. CERVICAL CT 1. No fracture or acute finding. 2. 2.2 cm left thyroid nodule. Recommend thyroid US (ref: J Am Coll Radiol. 2015 Feb;12(2): 143-50). Electronically Signed   By: Lajean Manes M.D.   On: 06/05/2022 11:02   CT Cervical Spine Wo Contrast  Result Date: 06/05/2022 CLINICAL DATA:  Fall. Hematoma above the right eye. Patient on blood thinners. EXAM: CT HEAD WITHOUT CONTRAST CT MAXILLOFACIAL WITHOUT CONTRAST CT CERVICAL SPINE WITHOUT CONTRAST TECHNIQUE: Multidetector CT imaging of the head, cervical spine, and maxillofacial structures were performed using the standard protocol without intravenous contrast. Multiplanar CT image reconstructions of the cervical spine and maxillofacial structures were also generated. RADIATION DOSE REDUCTION: This exam was performed according to the departmental dose-optimization program which includes automated exposure control, adjustment of the mA and/or kV according to patient size and/or use of iterative reconstruction technique. COMPARISON:  Head CT, 11/08/2019. FINDINGS: CT HEAD FINDINGS Brain: No evidence of acute infarction, hemorrhage, hydrocephalus, extra-axial collection or mass lesion/mass effect. Vascular: No hyperdense vessel or unexpected calcification. Skull: Normal. Negative for fracture or focal lesion. Other: Large right supraorbital forehead scalp hematoma. CT MAXILLOFACIAL FINDINGS Osseous: No fracture or mandibular dislocation. No destructive process. Orbits: Negative. No traumatic or inflammatory finding. Sinuses: Clear. Soft tissues: Large right supraorbital, forehead hematoma. Bilateral carotid artery  atherosclerotic calcifications. Soft tissues otherwise unremarkable. CT CERVICAL SPINE FINDINGS Alignment: Normal. Skull base and vertebrae: No acute fracture. No primary bone lesion or focal pathologic process. Soft tissues and spinal canal: No prevertebral fluid or swelling. No visible canal hematoma. Disc levels: Moderate loss of disc height at C5-C6 with mild disc bulging and endplate spurring. Remaining cervical discs are well preserved. Facet degenerative change, most evident on the left at C3-C4 C4-C5. No convincing disc herniation. Upper chest: No acute findings.  2.2 cm left thyroid nodule. Other: None. IMPRESSION: HEAD CT 1. No acute intracranial abnormalities. 2. Large right supraorbital/forehead scalp hematoma. No skull fracture. MAXILLOFACIAL CT 1. No fracture. CERVICAL CT 1. No fracture or acute finding. 2. 2.2 cm left thyroid nodule. Recommend thyroid US (ref: J Am Coll Radiol. 2015 Feb;12(2): 143-50). Electronically Signed   By: Lajean Manes M.D.   On: 06/05/2022 11:02   CT Maxillofacial WO CM  Result Date: 06/05/2022 CLINICAL DATA:  Fall. Hematoma above the right eye. Patient on blood thinners. EXAM: CT HEAD WITHOUT CONTRAST CT MAXILLOFACIAL WITHOUT CONTRAST CT CERVICAL SPINE WITHOUT CONTRAST TECHNIQUE: Multidetector CT imaging of the head, cervical spine, and maxillofacial structures were performed using the standard protocol without intravenous contrast. Multiplanar CT image reconstructions of the cervical spine and maxillofacial structures were also generated. RADIATION DOSE REDUCTION: This exam was performed according to the departmental dose-optimization program which includes automated exposure control, adjustment of the mA and/or kV according to patient size and/or use of iterative reconstruction technique. COMPARISON:  Head CT, 11/08/2019. FINDINGS: CT HEAD FINDINGS Brain: No evidence of acute infarction, hemorrhage, hydrocephalus, extra-axial collection or mass lesion/mass effect.  Vascular: No hyperdense vessel or unexpected calcification. Skull: Normal. Negative for fracture or focal lesion. Other: Large right supraorbital forehead scalp hematoma. CT MAXILLOFACIAL FINDINGS Osseous: No fracture or mandibular dislocation. No destructive process. Orbits: Negative. No traumatic or inflammatory finding. Sinuses: Clear. Soft tissues: Large right supraorbital, forehead hematoma. Bilateral carotid artery atherosclerotic calcifications. Soft tissues otherwise unremarkable. CT CERVICAL SPINE FINDINGS Alignment: Normal. Skull base and vertebrae: No acute fracture. No primary bone lesion or focal pathologic process. Soft tissues and spinal  canal: No prevertebral fluid or swelling. No visible canal hematoma. Disc levels: Moderate loss of disc height at C5-C6 with mild disc bulging and endplate spurring. Remaining cervical discs are well preserved. Facet degenerative change, most evident on the left at C3-C4 C4-C5. No convincing disc herniation. Upper chest: No acute findings.  2.2 cm left thyroid nodule. Other: None. IMPRESSION: HEAD CT 1. No acute intracranial abnormalities. 2. Large right supraorbital/forehead scalp hematoma. No skull fracture. MAXILLOFACIAL CT 1. No fracture. CERVICAL CT 1. No fracture or acute finding. 2. 2.2 cm left thyroid nodule. Recommend thyroid US (ref: J Am Coll Radiol. 2015 Feb;12(2): 143-50). Electronically Signed   By: Lajean Manes M.D.   On: 06/05/2022 11:02    Procedures Procedures  This patient was on telemetry or cardiac monitoring during their time in the ED.    Medications Ordered in ED Medications - No data to display  ED Course/ Medical Decision Making/ A&P                           Medical Decision Making Amount and/or Complexity of Data Reviewed Labs: ordered. Radiology: ordered.    MDM  This is a 88 y.o. female who presents to the ED with fall  Initial Impression  Well appearing and in no acute distress. Stable vitals. The fall itself  seems mechanical in etiology.  Lower suspicion for cardiac etiology. She is completely intact neurological exam.  She does note to have nonexpanding hematoma overlying her right eyebrow.  No other injuries are noted. Given that she is on blood thinners and reports a loss of consciousness at the fall, we need to obtain CT head, maxillofacial, and cervical spine.  I personally ordered, reviewed, and interpreted all laboratory work and imaging and agree with radiologist interpretation. Results interpreted below:  CBC: wbc 3.6 (stable) BMP: stable kidney function, electrolytes okay PT and INR normal Head/Maxillofacial/Cervical CT: No intracranial abnormalities, right supraorbital scalp hematoma present.  No evidence of skull fracture, facial fracture, orbital fracture, or cervical fracture.  Incidental 2.2 cm left thyroid nodule found, recommend thyroid ultrasound outpatient  Assessment/Plan:  Imaging is very reassuring. Right supraorbital scalp hematoma present. Seems stable on reassessment. Patient now has increased bruising around the eye. Recommend ice to reduce swelling. No visual impairment. Patient is stable for discharge. Recommend supportive treatment for hematoma at home. F/u with PCP. Return precautions provided.    Charting Requirements Additional history is obtained from:  Independent historian and Spouse/Significant Other External Records from outside source obtained and reviewed including: Prior cardiology note from May 2023 Social Determinants of Health:  none Pertinant PMH that complicates patient's illness: PE on Eliquis  Patient Care Problems that were addressed during this visit: - Fall: Acute illness with complication - Forehead Hematoma: Acute illness with complication This patient was maintained on a cardiac monitor/telemetry. I personally viewed and interpreted the cardiac monitor which reveals an underlying rhythm of NSR Medications given in ED: n/a Reevaluation of the  patient after these medicines showed that the patient stayed the same I have reviewed home medications and made changes accordingly.  Critical Care Interventions:  Consultations: n/a Disposition: discharge  This is a supervised visit with my attending physician, Dr. Kathrynn Humble. We have discussed this patient and they have altered the plan as needed.  Portions of this note were generated with [employer]. Dictation errors may occur despite best attempts at proofreading.   Final Clinical Impression(s) / ED Diagnoses Final diagnoses:  Fall, initial encounter  Hematoma    Rx / DC Orders ED Discharge Orders     None         Adolphus Birchwood, PA-C 06/05/22 [place], Ankit, MD 06/10/22 [id number]

## 2022-06-05 NOTE — Discharge Instructions (Signed)
Your CT imaging revealed no underlying fracture, or intracranial bleeding. You do have a soft tissue hematoma developing around your eye. Please keep ice on this to decrease swelling. Take Tylenol for pain.  Please return if you develop vision disturbance, develop dizziness, or develop new concerning symptoms.

## 2022-06-23 NOTE — Progress Notes (Unsigned)
Cardiology Office Note   Date:  06/23/2022   ID:  Jennifer Warner, DOB 1934/04/04, MRN [id number]  PCP:  Merrilee Seashore, MD  Cardiologist:  Anderson Middlebrooks Martinique, MD EP: None  No chief complaint on file.     History of Present Illness: Jennifer Warner is a 88 y.o. female is see for follow up CAD. She has  a PMH of CAD s/p CABG in 2009 (LIMA to LAD, SVG to left posterolateral branch of the LCx), HTN, HLD, DM type 2 on insulin, PE on coumadin.  She was admitted in November 2018 for submassive PE with right heart strain though not a candidate for thrombectomy/thombolysis, for which she was started on coumadin. Her aspirin was discontinued at that time to minimize bleeding risk. No other medication changes occurred at that visit. Her last ischemic evaluation was a LHC in 2017 which showed patent LIMA to LAD and SVG to OM, occluded LAD, moderate nondominant RCA disease, high grade obstruction of mLCx between 1st-3rd OM branch, and moderate PDA disease which was medically managed. Her last echo was 07/2017 which showed EF 55-60%, no RWMA, mild RV systolic dysfunction and severely increased PA pressures (in the setting of PE), and moderate TR.   She feels well from a cardiac standpoint. The only episode of chest pain she had was when her sister was in a MVA and was hospitalized. No other Chest pain or SOB.  No palpitations or edema. She is having some sciatica. She is now living in [place], [place]. Is tolerating Eliquis well.    Past Medical History:  Diagnosis Date   Acute thyroiditis    CAD (coronary artery disease)    CAP (community acquired pneumonia) 12/2012   Essential hypertension, benign    GERD (gastroesophageal reflux disease)    History of blood transfusion 05/2008   "when I had my bypass"   Hyperkalemia    Hypertriglyceridemia    Other secondary thrombocytopenia    Polycystic ovaries    Postsurgical aortocoronary bypass status    Pure hypercholesterolemia    Type II diabetes  mellitus (HCC)    Urinary tract infection, site not specified     Past Surgical History:  Procedure Laterality Date   CARDIAC CATHETERIZATION  05/2008   CARDIAC CATHETERIZATION N/A 05/03/2016   Procedure: Left Heart Cath and Coronary Angiography;  Surgeon: Belva Crome, MD;  Location: [facility];  Service: Cardiovascular;  Laterality: N/A;   CATARACT EXTRACTION W/ INTRAOCULAR LENS  IMPLANT, BILATERAL Bilateral ~ [place]   CORONARY ARTERY BYPASS GRAFT  05/2008   X2, [place] to LAD, SVG to PDA   DILATION AND CURETTAGE OF UTERUS     LAPAROSCOPIC CHOLECYSTECTOMY  1990s   TUBAL LIGATION       Current Outpatient Medications  Medication Sig Dispense Refill   amLODipine (NORVASC) 5 MG tablet Take 1 tablet (5 mg total) by mouth daily. 90 tablet 3   apixaban (ELIQUIS) 2.5 MG TABS tablet Take 1 tablet (2.5 mg total) by mouth 2 (two) times daily. NEEDS LABS FOR ELIQUIS REFILLS.  PLEASE COME TO OFFICE. 60 tablet 1   atorvastatin (LIPITOR) 20 MG tablet Take 1 tablet (20 mg total) by mouth daily. 90 tablet 3   diphenhydramine-acetaminophen (TYLENOL PM) 25-500 MG TABS Take 2 tablets by mouth at bedtime.     fenofibrate (TRICOR) 145 MG tablet Take 1 tablet (145 mg total) by mouth every evening. 90 tablet 3   hydrochlorothiazide (MICROZIDE)  12.5 MG capsule Take 1 capsule (12.5 mg total) by mouth daily. 90 capsule 3   Insulin Glargine (BASAGLAR KWIKPEN) 100 UNIT/ML Inject into the skin daily.     LANTUS SOLOSTAR 100 UNIT/ML injection Inject 15 Units into the skin at bedtime.     metoprolol tartrate (LOPRESSOR) 25 MG tablet Take 1 tablet (25 mg total) by mouth 2 (two) times daily. 180 tablet 3   nitroGLYCERIN (NITROSTAT) 0.4 MG SL tablet Place 1 tablet (0.4 mg total) under the tongue every 5 (five) minutes x 3 doses as needed for chest pain. 25 tablet 2   pantoprazole (PROTONIX) 40 MG tablet Take 40 mg by mouth every morning.     Vitamin D, Ergocalciferol, 50 MCG (2000 UT) CAPS  Take 2,000 Units by mouth daily.  1   No current facility-administered medications for this visit.    Allergies:   Levofloxacin    Social History:  The patient  reports that she has never smoked. She has never used smokeless tobacco. She reports that she does not drink alcohol and does not use drugs.   Family History:  The patient's family history includes Cancer in her brother; Liver disease (age of onset: 25) in her mother; Stroke in her brother.    ROS:  Please see the history of present illness.   Otherwise, review of systems are positive for none.   All other systems are reviewed and negative.    PHYSICAL EXAM: VS:  There were no vitals taken for this visit. , BMI There is no height or weight on file to calculate BMI. GEN: Well nourished, well developed, in no acute distress HEENT: sclera anicteric, small scar on occiput. No significant hematoma. Neck: no JVD, + bilateral carotid bruits, or masses Cardiac: RRR; no murmurs, rubs, or gallops, no edema  Respiratory:  clear to auscultation bilaterally, normal work of breathing GI: soft, nontender, nondistended, + BS MS: no deformity or atrophy Skin: warm and dry, no rash Neuro:  Strength and sensation are intact. Alert and oriented x 3.  Psych: euthymic mood, full affect   EKG:  EKG is  ordered today. NSR, normal Ecg. I have personally reviewed and interpreted this study.     Recent Labs: 06/05/2022: BUN 28; Creatinine, Ser 0.98; Hemoglobin 12.4; Platelets 178; Potassium 4.2; Sodium 140    Lipid Panel    Component Value Date/Time   CHOL 165 08/23/2019 1023   TRIG 228 (H) 08/23/2019 1023   HDL 38 (L) 08/23/2019 1023   CHOLHDL 4.3 08/23/2019 1023   CHOLHDL 5.1 08/15/2017 0401   VLDL 56 (H) 08/15/2017 0401   LDLCALC 89 08/23/2019 1023    Dated 08/11/20: normal LFTs Dated 10/14/20: cholesterol 157, triglycerides 219, HDL 44, LDL 77.  Dated 11/18/20: BUN 26, creatinine 1.44. otherwise BMET normal.  Dated 08/11/21:  cholesterol 140, triglycerides 230, HDL 38, nonHDL 102.   Wt Readings from Last 3 Encounters:  06/05/22 112 lb (50.8 kg)  03/11/22 119 lb (54 kg)  02/15/21 117 lb (53.1 kg)      Other studies Reviewed: Additional studies/ records that were reviewed today include:   Echocardiogram 07/2017 Study Conclusions   - Left ventricle: The cavity size was normal. Wall thickness was   normal. Systolic function was normal. The estimated ejection   fraction was in the range of 55% to 60%. Wall motion was normal;   there were no regional wall motion abnormalities. Doppler   parameters are consistent with elevated mean left atrial filling  pressure. - Right ventricle: The cavity size was moderately dilated. Systolic   function was mildly reduced. - Right atrium: The atrium was mildly dilated. - Tricuspid valve: There was moderate regurgitation. - Pulmonary arteries: Systolic pressure was severely increased. PA   peak pressure: 77 mm Hg (S).  Left heart catheterization 2017: Prox RCA to Mid RCA lesion, 70% stenosed. Mid Cx lesion, 80% stenosed. Ost LPDA to LPDA lesion, 70% stenosed. SVG was injected . was injected is normal in caliber, and is anatomically normal. Prox LAD to Mid LAD lesion, 100% stenosed. Ost LM to LM lesion, 70% stenosed.   Prolonged procedure, caused by failed attempt at left radial approach due to radial loop and the development of a microperforation due to diagnostic catheter/guidewire injury. Hemostasis was achieved with compression and an Ace bandage. Saphenous vein graft to the third obtuse marginal is widely patent. The procedure was prolonged because the graft was not marked and required aortography to identify the site of origin. Widely patent LIMA to LAD. Nondominant moderately diseased, 70% mid right coronary artery. Total occlusion of the proximal native LAD. High-grade obstruction of the mid circumflex between the large first and third obtuse marginal branch.  There is also 70% stenosis in the PDA. Normal left ventricular function and hemodynamics. Atrial fibrillation developed transiently, lasting approximally 3 minutes. Atrial fibrillation was incited by mechanical irritation during left ventriculography.     RECOMMENDATIONS:   Watch medial left arm for evidence of bleeding/hematoma. Monitor femoral site for evidence of bleeding. Heparin can probably be discontinued and converted to DVT prophylaxis.      ASSESSMENT AND PLAN:  1. CAD s/p CABG in 2009: not on aspirin given need for anticoagulation.  - cardiac cath in 2017 showed patent grafts.  - No significant angina. - Continue statin and fenofibrate - Continue metoprolol  2. HTN: BP is now well controlled.  - Continue metoprolol succinate - Continue amlodipine 5 mg daily.   3. HLD/hypertriglyceridemia: nonHDL 102. Elevated triglycerides  - Continue atorvastatin and fenofibrate  4. DM type 2: followed by Dr Chalmers Cater   5. History of PE: unprovoked. On coumadin initially. Later switched to Eliquis. Tolerating well. Labs followed by PCP.  6. Bilateral carotid bruits. Dopplers with nonobstructive disease.  Disposition:   FU   will follow up one year  Signed, Mariel Gaudin Martinique, MD  06/23/2022 2:36 PM

## 2022-06-27 ENCOUNTER — Encounter: Payer: Self-pay | Admitting: Cardiology

## 2022-06-27 ENCOUNTER — Ambulatory Visit: Payer: Medicare Other | Attending: Cardiology | Admitting: Cardiology

## 2022-06-27 ENCOUNTER — Telehealth: Payer: Self-pay | Admitting: *Deleted

## 2022-06-27 VITALS — BP 158/66 | HR 64 | Ht 63.0 in | Wt 117.0 lb

## 2022-06-27 DIAGNOSIS — I2699 Other pulmonary embolism without acute cor pulmonale: Secondary | ICD-10-CM | POA: Diagnosis not present

## 2022-06-27 DIAGNOSIS — I10 Essential (primary) hypertension: Secondary | ICD-10-CM

## 2022-06-27 DIAGNOSIS — Z951 Presence of aortocoronary bypass graft: Secondary | ICD-10-CM | POA: Diagnosis not present

## 2022-06-27 DIAGNOSIS — R55 Syncope and collapse: Secondary | ICD-10-CM

## 2022-06-27 DIAGNOSIS — I25708 Atherosclerosis of coronary artery bypass graft(s), unspecified, with other forms of angina pectoris: Secondary | ICD-10-CM

## 2022-06-27 NOTE — Telephone Encounter (Signed)
No answer no voice mail  

## 2022-06-29 ENCOUNTER — Telehealth: Payer: Self-pay | Admitting: *Deleted

## 2022-06-29 NOTE — Telephone Encounter (Signed)
Patient scheduled to have 30 day cardiac event monitor applied at [facility] office, Monday, 07/04/22, 11:00 AM.

## 2022-07-04 ENCOUNTER — Telehealth: Payer: Self-pay | Admitting: *Deleted

## 2022-07-04 ENCOUNTER — Ambulatory Visit: Payer: Medicare Other

## 2022-07-04 NOTE — Telephone Encounter (Signed)
Patient stated she went to the wrong location and would like to speak with [name].

## 2022-07-04 NOTE — Telephone Encounter (Signed)
Spoke to patient she stated she was unable to keep monitor appointment today due to her husband having other appointments.Stated he is scheduled to have surgery this week.She will call back to reschedule monitor appointment.

## 2022-07-04 NOTE — Telephone Encounter (Signed)
Called to assist patient. She cut me off and said she wanted to speak with Malachy Mood.

## 2022-07-04 NOTE — Telephone Encounter (Signed)
Patient had missed 07/04/22, 11:00 AM appointment to have her monitor applied at [facility] office. Patient went to [facility] office instead. Offered to have patient come in today to have monitor applied.  Patient  declined stating she was going out of town for a few weeks and she would call Malachy Mood to let her know.

## 2022-07-11 ENCOUNTER — Ambulatory Visit: Payer: Medicare Other | Attending: Cardiology

## 2022-07-11 DIAGNOSIS — Z951 Presence of aortocoronary bypass graft: Secondary | ICD-10-CM | POA: Diagnosis not present

## 2022-07-11 DIAGNOSIS — I25708 Atherosclerosis of coronary artery bypass graft(s), unspecified, with other forms of angina pectoris: Secondary | ICD-10-CM

## 2022-07-11 DIAGNOSIS — R55 Syncope and collapse: Secondary | ICD-10-CM

## 2022-07-11 DIAGNOSIS — I2699 Other pulmonary embolism without acute cor pulmonale: Secondary | ICD-10-CM | POA: Diagnosis not present

## 2022-07-11 DIAGNOSIS — I10 Essential (primary) hypertension: Secondary | ICD-10-CM | POA: Diagnosis not present

## 2022-07-11 NOTE — Progress Notes (Unsigned)
[employer] event monitor serial # [id number] from office inventory applied to patient.

## 2022-07-18 ENCOUNTER — Ambulatory Visit: Payer: Medicare Other

## 2022-07-18 ENCOUNTER — Encounter: Payer: Self-pay | Admitting: *Deleted

## 2022-07-18 NOTE — Progress Notes (Signed)
Patient ID: Jennifer Warner, female   DOB: 12-16-1933, 88 y.o.   MRN: [id number] Changed out strip and battery on [name]'s cardiac event monitor.

## 2022-07-29 ENCOUNTER — Telehealth: Payer: Self-pay | Admitting: Cardiology

## 2022-07-29 NOTE — Telephone Encounter (Signed)
Patient is calling requesting to speak with [facility] regarding redoing her heart monitor and requesting assistance to make sure it's done right. Please advise.

## 2022-08-01 NOTE — Telephone Encounter (Signed)
Patient was able to resolve the problem on her own.

## 2022-09-26 ENCOUNTER — Telehealth: Payer: Self-pay | Admitting: Cardiology

## 2022-09-26 NOTE — Telephone Encounter (Signed)
Patient calling the office for samples of medication:   1.  What medication and dosage are you requesting samples for? apixaban (ELIQUIS) 2.5 MG TABS tablet  2.  Are you currently out of this medication? Almost. Pt states she is not currently at her house. She just had knee surgery an is staying with someone who is helping care for her. She states she needs a sample instead of having a large quantity sent in.

## 2022-09-26 NOTE — Telephone Encounter (Signed)
Left detailed message on patient's cell that her eliquis samples are ready for pickup.  Eliquis 2.'5mg'$  twice daily (4 boxes = 1 month) Lot #: [id number]; exp: 4/24. Approved by M. Dapp, RN in pharmacy.

## 2022-10-18 NOTE — Progress Notes (Deleted)
Cardiology Office Note   Date:  10/18/2022   ID:  Jennifer Warner, DOB 27-Jun-1934, MRN [id number]  PCP:  Merrilee Seashore, MD  Cardiologist:  Freedom Lopezperez Martinique, MD EP: None  No chief complaint on file.    History of Present Illness: Jennifer Warner is a 89 y.o. female is see for follow up CAD. She has  a PMH of CAD s/p CABG in 2009 (LIMA to LAD, SVG to left posterolateral branch of the LCx), HTN, HLD, DM type 2 on insulin, PE on coumadin.  She was admitted in November 2018 for submassive PE with right heart strain though not a candidate for thrombectomy/thombolysis, for which she was started on coumadin. Her aspirin was discontinued at that time to minimize bleeding risk. No other medication changes occurred at that visit. Her last ischemic evaluation was a LHC in 2017 which showed patent LIMA to LAD and SVG to OM, occluded LAD, moderate nondominant RCA disease, high grade obstruction of mLCx between 1st-3rd OM branch, and moderate PDA disease which was medically managed. Her last echo was 07/2017 which showed EF 55-60%, no RWMA, mild RV systolic dysfunction and severely increased PA pressures (in the setting of PE), and moderate TR. Coumadin later switched to Eliquis.    When seen last she had a syncopal episode. States she was walking and turned around when someone called her name. She fell and hit her face against the wall. Thinks she blacked out but isn't sure. Went to the ED and CT scans and labs all OK. Ecg unchanged. States she didn't have anything to eat that morning and thought her sugar was low. Has a lot of facial bruising but no fracture. Subsequent event monitor was normal.   Past Medical History:  Diagnosis Date   Acute thyroiditis    CAD (coronary artery disease)    CAP (community acquired pneumonia) 12/2012   Essential hypertension, benign    GERD (gastroesophageal reflux disease)    History of blood transfusion 05/2008   "when I had my bypass"   Hyperkalemia     Hypertriglyceridemia    Other secondary thrombocytopenia    Polycystic ovaries    Postsurgical aortocoronary bypass status    Pure hypercholesterolemia    Type II diabetes mellitus (HCC)    Urinary tract infection, site not specified     Past Surgical History:  Procedure Laterality Date   CARDIAC CATHETERIZATION  05/2008   CARDIAC CATHETERIZATION N/A 05/03/2016   Procedure: Left Heart Cath and Coronary Angiography;  Surgeon: Belva Crome, MD;  Location: [facility];  Service: Cardiovascular;  Laterality: N/A;   CATARACT EXTRACTION W/ INTRAOCULAR LENS  IMPLANT, BILATERAL Bilateral ~ [place]   CORONARY ARTERY BYPASS GRAFT  05/2008   X2, [place] to LAD, SVG to PDA   DILATION AND CURETTAGE OF UTERUS     LAPAROSCOPIC CHOLECYSTECTOMY  1990s   TUBAL LIGATION       Current Outpatient Medications  Medication Sig Dispense Refill   amLODipine (NORVASC) 5 MG tablet Take 1 tablet (5 mg total) by mouth daily. 90 tablet 3   apixaban (ELIQUIS) 2.5 MG TABS tablet Take 1 tablet (2.5 mg total) by mouth 2 (two) times daily. NEEDS LABS FOR ELIQUIS REFILLS.  PLEASE COME TO OFFICE. 60 tablet 1   atorvastatin (LIPITOR) 20 MG tablet Take 1 tablet (20 mg total) by mouth daily. 90 tablet 3   diphenhydramine-acetaminophen (TYLENOL PM) 25-500 MG TABS Take 2 tablets by  mouth at bedtime.     fenofibrate (TRICOR) 145 MG tablet Take 1 tablet (145 mg total) by mouth every evening. 90 tablet 3   hydrochlorothiazide (MICROZIDE) 12.5 MG capsule Take 1 capsule (12.5 mg total) by mouth daily. 90 capsule 3   LANTUS SOLOSTAR 100 UNIT/ML injection Inject 15 Units into the skin at bedtime.     metoprolol tartrate (LOPRESSOR) 25 MG tablet Take 1 tablet (25 mg total) by mouth 2 (two) times daily. 180 tablet 3   nitroGLYCERIN (NITROSTAT) 0.4 MG SL tablet Place 1 tablet (0.4 mg total) under the tongue every 5 (five) minutes x 3 doses as needed for chest pain. 25 tablet 2   pantoprazole (PROTONIX) 40 MG  tablet Take 40 mg by mouth every morning.     Vitamin D, Ergocalciferol, 50 MCG (2000 UT) CAPS Take 2,000 Units by mouth daily.  1   No current facility-administered medications for this visit.    Allergies:   Levofloxacin    Social History:  The patient  reports that she has never smoked. She has never used smokeless tobacco. She reports that she does not drink alcohol and does not use drugs.   Family History:  The patient's family history includes Cancer in her brother; Liver disease (age of onset: 29) in her mother; Stroke in her brother.    ROS:  Please see the history of present illness.   Otherwise, review of systems are positive for none.   All other systems are reviewed and negative.    PHYSICAL EXAM: VS:  There were no vitals taken for this visit. , BMI There is no height or weight on file to calculate BMI. GEN: Well nourished, well developed, in no acute distress HEENT: sclera anicteric, bruising on both cheeks and small knot on right eyebrow. Neck: no JVD, + bilateral carotid bruits, or masses Cardiac: RRR; no murmurs, rubs, or gallops, no edema  Respiratory:  clear to auscultation bilaterally, normal work of breathing GI: soft, nontender, nondistended, + BS MS: no deformity or atrophy Skin: warm and dry, no rash Neuro:  Strength and sensation are intact. Alert and oriented x 3.  Psych: euthymic mood, full affect   EKG:  EKG is not  ordered today.   Labs and Xrays reviewed from recent ED visit.    Recent Labs: 06/05/2022: BUN 28; Creatinine, Ser 0.98; Hemoglobin 12.4; Platelets 178; Potassium 4.2; Sodium 140    Lipid Panel    Component Value Date/Time   CHOL 165 08/23/2019 1023   TRIG 228 (H) 08/23/2019 1023   HDL 38 (L) 08/23/2019 1023   CHOLHDL 4.3 08/23/2019 1023   CHOLHDL 5.1 08/15/2017 0401   VLDL 56 (H) 08/15/2017 0401   LDLCALC 89 08/23/2019 1023    Dated 08/11/20: normal LFTs Dated 10/14/20: cholesterol 157, triglycerides 219, HDL 44, LDL 77.   Dated 11/18/20: BUN 26, creatinine 1.44. otherwise BMET normal.  Dated 08/11/21: cholesterol 140, triglycerides 230, HDL 38, nonHDL 102.  Dated 05/30/22: A1c 5.4%. CBC normal.   Wt Readings from Last 3 Encounters:  06/27/22 117 lb (53.1 kg)  06/05/22 112 lb (50.8 kg)  03/11/22 119 lb (54 kg)      Other studies Reviewed: Additional studies/ records that were reviewed today include:   Echocardiogram 07/2017 Study Conclusions   - Left ventricle: The cavity size was normal. Wall thickness was   normal. Systolic function was normal. The estimated ejection   fraction was in the range of 55% to 60%. Wall motion was normal;  there were no regional wall motion abnormalities. Doppler   parameters are consistent with elevated mean left atrial filling   pressure. - Right ventricle: The cavity size was moderately dilated. Systolic   function was mildly reduced. - Right atrium: The atrium was mildly dilated. - Tricuspid valve: There was moderate regurgitation. - Pulmonary arteries: Systolic pressure was severely increased. PA   peak pressure: 77 mm Hg (S).  Left heart catheterization 2017: Prox RCA to Mid RCA lesion, 70% stenosed. Mid Cx lesion, 80% stenosed. Ost LPDA to LPDA lesion, 70% stenosed. SVG was injected . was injected is normal in caliber, and is anatomically normal. Prox LAD to Mid LAD lesion, 100% stenosed. Ost LM to LM lesion, 70% stenosed.   Prolonged procedure, caused by failed attempt at left radial approach due to radial loop and the development of a microperforation due to diagnostic catheter/guidewire injury. Hemostasis was achieved with compression and an Ace bandage. Saphenous vein graft to the third obtuse marginal is widely patent. The procedure was prolonged because the graft was not marked and required aortography to identify the site of origin. Widely patent LIMA to LAD. Nondominant moderately diseased, 70% mid right coronary artery. Total occlusion of the  proximal native LAD. High-grade obstruction of the mid circumflex between the large first and third obtuse marginal branch. There is also 70% stenosis in the PDA. Normal left ventricular function and hemodynamics. Atrial fibrillation developed transiently, lasting approximally 3 minutes. Atrial fibrillation was incited by mechanical irritation during left ventriculography.     RECOMMENDATIONS:   Watch medial left arm for evidence of bleeding/hematoma. Monitor femoral site for evidence of bleeding. Heparin can probably be discontinued and converted to DVT prophylaxis.    Event monitor 07/11/22: Study Highlights      Normal sinus rhythm range 45-127 bpm   No arrhythmia seen  ASSESSMENT AND PLAN:  1. CAD s/p CABG in 2009: not on aspirin given need for anticoagulation.  - cardiac cath in 2017 showed patent grafts.  - No significant angina. - Continue statin and fenofibrate - Continue metoprolol  2. HTN: BP is now well controlled.  - Continue metoprolol succinate - Continue amlodipine 5 mg daily.   3. HLD/hypertriglyceridemia: nonHDL 102. Elevated triglycerides  - Continue atorvastatin and fenofibrate  4. DM type 2: followed by Dr Chalmers Cater   5. History of PE: unprovoked. On coumadin initially. Later switched to Eliquis. Tolerating well.  6. Bilateral carotid bruits. Dopplers with nonobstructive disease.  7. Possible syncope. Will have her wear a Zio patch monitor.   Disposition:   FU   will follow up 6 months   Signed, Emmery Seiler Martinique, MD  10/18/2022 12:00 PM

## 2022-10-27 ENCOUNTER — Ambulatory Visit: Payer: Medicare Other | Attending: Cardiology | Admitting: Cardiology

## 2022-12-21 ENCOUNTER — Telehealth: Payer: Self-pay | Admitting: Cardiology

## 2022-12-21 NOTE — Telephone Encounter (Signed)
     Pre-operative Risk Assessment    Patient Name: Jennifer Warner  DOB: 12/03/1933 MRN: [id number]     Request for Surgical Clearance    Procedure:   root canal therapy   Date of Surgery:  Clearance 12/30/22                                 Surgeon:  Dr. Murrell Redden Surgeon's Group or Practice Name:  [facility]  Phone number:  [phone] Fax number:  [phone]   Type of Clearance Requested:   - Medical  - Pharmacy:  Hold Apixaban (Eliquis) defer to cards   Type of Anesthesia:  Local    Additional requests/questions:  Does this patient need antibiotics?  Signed, Selinda Orion   12/21/2022, 9:27 AM

## 2022-12-21 NOTE — Telephone Encounter (Signed)
    Primary Cardiologist: Peter Martinique, MD  Chart reviewed as part of pre-operative protocol coverage. Simple dental extractions are considered low risk procedures per guidelines and generally do not require any specific cardiac clearance. It is also generally accepted that for simple extractions and dental cleanings, there is no need to interrupt blood thinner therapy.   SBE prophylaxis is not required for the patient.  I will route this recommendation to the requesting party via Epic fax function and remove from pre-op pool.  Please call with questions.  Deberah Pelton, NP 12/21/2022, 11:52 AM

## 2022-12-21 NOTE — Telephone Encounter (Signed)
    Primary Cardiologist: Peter Martinique, MD  Chart reviewed as part of pre-operative protocol coverage.  Dental procedures involving a single tooth are considered low risk procedures per guidelines and generally do not require any specific cardiac clearance. It is also generally accepted that there is no need to interrupt blood thinner therapy.   SBE prophylaxis is not required for the patient.  I will route this recommendation to the requesting party via Epic fax function and remove from pre-op pool.  Please call with questions.  Deberah Pelton, NP 12/21/2022, 2:06 PM

## 2022-12-21 NOTE — Telephone Encounter (Signed)
Jennifer Warner from [employer] called in stating "I do not understand the clearance request, this is not a simple dental procedure, its a root canal."  She is requesting a further review and for another clearance to be faxed over.

## 2022-12-21 NOTE — Telephone Encounter (Signed)
I will forward back to pre op provider to see notes from [facility] office.

## 2022-12-26 DIAGNOSIS — I10 Essential (primary) hypertension: Secondary | ICD-10-CM | POA: Diagnosis not present

## 2022-12-26 DIAGNOSIS — R5383 Other fatigue: Secondary | ICD-10-CM | POA: Diagnosis not present

## 2022-12-26 DIAGNOSIS — E1122 Type 2 diabetes mellitus with diabetic chronic kidney disease: Secondary | ICD-10-CM | POA: Diagnosis not present

## 2022-12-26 DIAGNOSIS — E559 Vitamin D deficiency, unspecified: Secondary | ICD-10-CM | POA: Diagnosis not present

## 2022-12-27 ENCOUNTER — Telehealth: Payer: Self-pay | Admitting: Cardiology

## 2022-12-27 NOTE — Telephone Encounter (Signed)
I s/w the pt and assured her that our office did fax over the clearance on 12/21/22 @ 2:07 pm Coletta Memos, FNP. I assured the pt that I will re-fax clearance notes again today.

## 2022-12-27 NOTE — Telephone Encounter (Signed)
   Patient Name: Jennifer Warner  DOB: Feb 09, 1934 MRN: [id number]  Primary Cardiologist: Peter Martinique, MD  Chart reviewed as part of pre-operative protocol coverage. Given past medical history and time since last visit, based on ACC/AHA guidelines, Jennifer Warner is at acceptable risk for the planned procedure without further cardiovascular testing.   Patient was instructed to hold Eliquis 1 to 2 days prior to her procedure and restart postprocedure when hemostasis is achieved tomorrow evening.  I will route this recommendation to the requesting party via Epic fax function and remove from pre-op pool.  Please call with questions.  Mable Fill, Marissa Nestle, NP 12/27/2022, 3:18 PM

## 2022-12-27 NOTE — Telephone Encounter (Signed)
They reported a possibility of pulling an additional tooth and possibly having an additional root canal.  If they are having more than 1 tooth extracted with a root canal would Eliquis be held in that situation?

## 2022-12-27 NOTE — Telephone Encounter (Signed)
Yes, she could hold Eliquis for 1-2 days if there were multiple teeth involved.  She was in the lobby earlier, and noted she had the procedure moved to tomorrow and stopped her Eliquis sometime in the past 48 hours.  Said that she hadn't heard so she went ahead and stopped.  I advised that she just re-start tomorrow evening after the procedure.

## 2022-12-27 NOTE — Telephone Encounter (Signed)
Patient is following-up on her clearance paperwork and wants to collect the clearance document this afternoon for her dental appointment tomorrow.

## 2022-12-27 NOTE — Telephone Encounter (Signed)
See clearance notes  

## 2022-12-27 NOTE — Telephone Encounter (Signed)
We do not routinely hold Eliquis for single extractions or root canals.

## 2022-12-27 NOTE — Telephone Encounter (Signed)
Pharmacy please advise on holding Eliquis prior to dental extraction with root canal scheduled for 12/30/2022.  She is on Eliquis for PE but appears we are currently managing.  Thank you.

## 2023-01-02 DIAGNOSIS — M810 Age-related osteoporosis without current pathological fracture: Secondary | ICD-10-CM | POA: Diagnosis not present

## 2023-01-02 DIAGNOSIS — Z794 Long term (current) use of insulin: Secondary | ICD-10-CM | POA: Diagnosis not present

## 2023-01-02 DIAGNOSIS — I10 Essential (primary) hypertension: Secondary | ICD-10-CM | POA: Diagnosis not present

## 2023-01-02 DIAGNOSIS — Z0000 Encounter for general adult medical examination without abnormal findings: Secondary | ICD-10-CM | POA: Diagnosis not present

## 2023-01-02 DIAGNOSIS — N1831 Chronic kidney disease, stage 3a: Secondary | ICD-10-CM | POA: Diagnosis not present

## 2023-01-02 DIAGNOSIS — R319 Hematuria, unspecified: Secondary | ICD-10-CM | POA: Diagnosis not present

## 2023-01-02 DIAGNOSIS — E7800 Pure hypercholesterolemia, unspecified: Secondary | ICD-10-CM | POA: Diagnosis not present

## 2023-01-02 DIAGNOSIS — D6869 Other thrombophilia: Secondary | ICD-10-CM | POA: Diagnosis not present

## 2023-01-02 DIAGNOSIS — E1122 Type 2 diabetes mellitus with diabetic chronic kidney disease: Secondary | ICD-10-CM | POA: Diagnosis not present

## 2023-01-02 DIAGNOSIS — I2510 Atherosclerotic heart disease of native coronary artery without angina pectoris: Secondary | ICD-10-CM | POA: Diagnosis not present

## 2023-01-02 DIAGNOSIS — E210 Primary hyperparathyroidism: Secondary | ICD-10-CM | POA: Diagnosis not present

## 2023-03-15 ENCOUNTER — Other Ambulatory Visit (HOSPITAL_COMMUNITY): Payer: Self-pay | Admitting: Internal Medicine

## 2023-03-15 ENCOUNTER — Other Ambulatory Visit: Payer: Self-pay | Admitting: Cardiology

## 2023-03-15 DIAGNOSIS — E1122 Type 2 diabetes mellitus with diabetic chronic kidney disease: Secondary | ICD-10-CM | POA: Diagnosis not present

## 2023-03-15 DIAGNOSIS — S0993XA Unspecified injury of face, initial encounter: Secondary | ICD-10-CM | POA: Diagnosis not present

## 2023-03-15 DIAGNOSIS — I2699 Other pulmonary embolism without acute cor pulmonale: Secondary | ICD-10-CM | POA: Diagnosis not present

## 2023-03-15 DIAGNOSIS — E7800 Pure hypercholesterolemia, unspecified: Secondary | ICD-10-CM | POA: Diagnosis not present

## 2023-03-15 DIAGNOSIS — N1831 Chronic kidney disease, stage 3a: Secondary | ICD-10-CM | POA: Diagnosis not present

## 2023-03-15 DIAGNOSIS — M545 Low back pain, unspecified: Secondary | ICD-10-CM | POA: Diagnosis not present

## 2023-03-15 DIAGNOSIS — W19XXXA Unspecified fall, initial encounter: Secondary | ICD-10-CM | POA: Diagnosis not present

## 2023-03-16 ENCOUNTER — Ambulatory Visit (HOSPITAL_COMMUNITY)
Admission: RE | Admit: 2023-03-16 | Discharge: 2023-03-16 | Disposition: A | Discharge status: Home / Self Care | Payer: Medicare Other | Source: Ambulatory Visit | Attending: Internal Medicine | Admitting: Internal Medicine

## 2023-03-16 DIAGNOSIS — I6782 Cerebral ischemia: Secondary | ICD-10-CM | POA: Diagnosis not present

## 2023-03-16 DIAGNOSIS — S0993XA Unspecified injury of face, initial encounter: Secondary | ICD-10-CM | POA: Diagnosis not present

## 2023-03-16 DIAGNOSIS — Z9181 History of falling: Secondary | ICD-10-CM | POA: Diagnosis not present

## 2023-03-16 DIAGNOSIS — I639 Cerebral infarction, unspecified: Secondary | ICD-10-CM | POA: Diagnosis not present

## 2023-03-17 ENCOUNTER — Other Ambulatory Visit: Payer: Self-pay | Admitting: Internal Medicine

## 2023-03-17 DIAGNOSIS — R9089 Other abnormal findings on diagnostic imaging of central nervous system: Secondary | ICD-10-CM

## 2023-03-25 ENCOUNTER — Ambulatory Visit
Admission: RE | Admit: 2023-03-25 | Discharge: 2023-03-25 | Disposition: A | Discharge status: Home / Self Care | Payer: Medicare Other | Source: Ambulatory Visit | Attending: Internal Medicine | Admitting: Internal Medicine

## 2023-03-25 DIAGNOSIS — I6389 Other cerebral infarction: Secondary | ICD-10-CM | POA: Diagnosis not present

## 2023-03-25 DIAGNOSIS — R9089 Other abnormal findings on diagnostic imaging of central nervous system: Secondary | ICD-10-CM

## 2023-03-25 DIAGNOSIS — G319 Degenerative disease of nervous system, unspecified: Secondary | ICD-10-CM | POA: Diagnosis not present

## 2023-03-29 DIAGNOSIS — I2699 Other pulmonary embolism without acute cor pulmonale: Secondary | ICD-10-CM | POA: Diagnosis not present

## 2023-03-29 DIAGNOSIS — N1831 Chronic kidney disease, stage 3a: Secondary | ICD-10-CM | POA: Diagnosis not present

## 2023-03-29 DIAGNOSIS — E1122 Type 2 diabetes mellitus with diabetic chronic kidney disease: Secondary | ICD-10-CM | POA: Diagnosis not present

## 2023-03-29 DIAGNOSIS — I6359 Cerebral infarction due to unspecified occlusion or stenosis of other cerebral artery: Secondary | ICD-10-CM | POA: Diagnosis not present

## 2023-03-29 DIAGNOSIS — W19XXXA Unspecified fall, initial encounter: Secondary | ICD-10-CM | POA: Diagnosis not present

## 2023-03-29 DIAGNOSIS — S0993XA Unspecified injury of face, initial encounter: Secondary | ICD-10-CM | POA: Diagnosis not present

## 2023-03-30 ENCOUNTER — Telehealth: Payer: Self-pay | Admitting: Cardiology

## 2023-03-30 NOTE — Telephone Encounter (Signed)
Spoke to patient she stated she has been having frequent falls.She saw PCP which sent her to see neurologist.Stated head ct revealed a small stroke.She is doing ok.Neurologist wanted her to have carotid dopplers.She wanted our office to schedule.Carotid dopplers scheduled 6/19 at 11:00 am.Follow up appointment scheduled with Dr.Jordan 10/4 at 2:40 am.Advised to call sooner if needed.I will make Dr.Jordan aware.

## 2023-03-30 NOTE — Telephone Encounter (Signed)
   Pt is requesting to speak with Elnita Maxwell. She said, she wants to speak with her

## 2023-04-03 ENCOUNTER — Telehealth: Payer: Self-pay

## 2023-04-03 ENCOUNTER — Encounter: Payer: Self-pay | Admitting: Neurology

## 2023-04-03 NOTE — Telephone Encounter (Signed)
Received a call from patient.She stated she missed her carotid doppler appointment last week.Stated she fell asleep and was sorry she missed appointment.Advised she did not miss appointment.Carotid doppler scheduled 6/19 at 11:00 am.

## 2023-04-05 ENCOUNTER — Ambulatory Visit (HOSPITAL_COMMUNITY)
Admission: RE | Admit: 2023-04-05 | Discharge: 2023-04-05 | Disposition: A | Discharge status: Home / Self Care | Payer: Medicare Other | Source: Ambulatory Visit | Attending: Cardiology | Admitting: Cardiology

## 2023-04-05 DIAGNOSIS — I6523 Occlusion and stenosis of bilateral carotid arteries: Secondary | ICD-10-CM | POA: Diagnosis not present

## 2023-04-11 ENCOUNTER — Ambulatory Visit (INDEPENDENT_AMBULATORY_CARE_PROVIDER_SITE_OTHER): Payer: Medicare Other | Admitting: Neurology

## 2023-04-11 ENCOUNTER — Encounter: Payer: Self-pay | Admitting: Neurology

## 2023-04-11 ENCOUNTER — Other Ambulatory Visit: Payer: Self-pay

## 2023-04-11 VITALS — BP 132/62 | HR 115 | Ht 63.0 in | Wt 113.0 lb

## 2023-04-11 DIAGNOSIS — R55 Syncope and collapse: Secondary | ICD-10-CM

## 2023-04-11 DIAGNOSIS — I2699 Other pulmonary embolism without acute cor pulmonale: Secondary | ICD-10-CM | POA: Diagnosis not present

## 2023-04-11 DIAGNOSIS — I25708 Atherosclerosis of coronary artery bypass graft(s), unspecified, with other forms of angina pectoris: Secondary | ICD-10-CM

## 2023-04-11 NOTE — Addendum Note (Signed)
Addended by: Neoma Laming on: 04/11/2023 06:22 PM   Modules accepted: Orders

## 2023-04-11 NOTE — Progress Notes (Signed)
[facility] Clinic Note - Initial Visit   Date: 04/11/2023   Jennifer Warner MRN: [id number] DOB: August 21, 1934   Dear Dr. Nicholos Johns:  Thank you for your kind referral of Jennifer Warner for consultation of stroke. Although her history is well known to you, please allow Jennifer Warner to reiterate it for the purpose of our medical record. The patient was accompanied to the clinic by husband who also provides collateral information.     KELEE CUNNINGHAM is a 89 y.o. ambidextrous-handed female with CAD s/p CABG, hypertension, hyperlipidemia, diabetes mellitus type 2, and pulmonary embolism presenting for evaluation of pontine stroke.   IMPRESSION/PLAN: Right pontine stroke with mild left side dysmetria and ataxia.  Likely small vessel disease, however to be complete, I will obtain intracranial vessel imaging.  Jennifer Warner carotids shows mild RICA and moderate LICA stenosis.  Low suspicion for embolic event as she is already on anticoagulation therapy.  She is scheduled to see her cardiologist next week and I will request if we can get echocardiogram and cardiac event monitor to be complete. Risk factors of diabetes and hyperlipidemia are well-controlled on medication.  Her blood pressure was elevated today (166/69, repeat improved to 1632/62) and I asked her to follow at home and share readings with cardiology next week.   Return to clinic in 3 months  ------------------------------------------------------------- History of present illness:3 She began having frequent falls ~April.  PCP ordered CT head which showed finding concerned for right pontine infarct, which was confirmed by MRI brain.  She denies numbness/tingling, vision changes, or difficulty with speech/swallow.  She endorses imbalance and reduced coordination on the left side.  She has been using cane for the past month.  She will be starting physical therapy for gait training.  She is on eliquis 2.5mg  BID for PE. She also  takes lipitor 20mg  daily, insulin for diabetes, and BP medication.    Out-side paper records, electronic medical record, and images have been reviewed where available and summarized as:  MRI brain wo contrast 03/25/2023: 1. 1.3 x 0.8 cm subacute infarct within the right aspect of the pons, present on the prior head CT of 03/16/2023 but new from the prior head CT of 06/05/2022. 2. Small chronic cortical infarct within the right parietal lobe. 3. Chronic lacunar infarct within the anterior right frontal lobe white matter. 4. Background chronic small vessel ischemic disease which is advanced in the cerebral white matter and mild in the pons. 5. Mild-to-moderate generalized cerebral atrophy. 6. Mild mucosal thickening within the right maxillary sinus.   Jennifer Warner carotid 04/05/2023: RICA 1-39% LICA 40-59%   Lab Results  Component Value Date   HGBA1C 6.1 (H) 08/15/2017   Lab Results  Component Value Date   CHOL 165 08/23/2019   HDL 38 (L) 08/23/2019   LDLCALC 89 08/23/2019   TRIG 228 (H) 08/23/2019   CHOLHDL 4.3 08/23/2019     Past Medical History:  Diagnosis Date   Acute thyroiditis    CAD (coronary artery disease)    CAP (community acquired pneumonia) 12/2012   Essential hypertension, benign    GERD (gastroesophageal reflux disease)    History of blood transfusion 05/2008   "when I had my bypass"   Hyperkalemia    Hypertriglyceridemia    Other secondary thrombocytopenia    Polycystic ovaries    Postsurgical aortocoronary bypass status    Pure hypercholesterolemia    Type II diabetes mellitus (HCC)    Urinary tract infection, site not specified  Past Surgical History:  Procedure Laterality Date   CARDIAC CATHETERIZATION  05/2008   CARDIAC CATHETERIZATION N/A 05/03/2016   Procedure: Left Heart Cath and Coronary Angiography;  Surgeon: Lyn Records, MD;  Location: [facility] INVASIVE CV LAB;  Service: Cardiovascular;  Laterality: N/A;   CATARACT EXTRACTION W/ INTRAOCULAR LENS   IMPLANT, BILATERAL Bilateral ~ 2000   CESAREAN SECTION  1964   CORONARY ARTERY BYPASS GRAFT  05/2008   X2, [place] to LAD, SVG to PDA   DILATION AND CURETTAGE OF UTERUS     LAPAROSCOPIC CHOLECYSTECTOMY  1990s   TUBAL LIGATION       Medications:  Outpatient Encounter Medications as of 04/11/2023  Medication Sig   amLODipine (NORVASC) 5 MG tablet Take 1 tablet (5 mg total) by mouth daily.   apixaban (ELIQUIS) 2.5 MG TABS tablet Take 1 tablet (2.5 mg total) by mouth 2 (two) times daily. NEEDS LABS FOR ELIQUIS REFILLS.  PLEASE COME TO OFFICE.   atorvastatin (LIPITOR) 20 MG tablet Take 1 tablet by mouth once daily   diphenhydramine-acetaminophen (TYLENOL PM) 25-500 MG TABS Take 2 tablets by mouth at bedtime.   fenofibrate (TRICOR) 145 MG tablet Take 1 tablet (145 mg total) by mouth every evening.   hydrochlorothiazide (MICROZIDE) 12.5 MG capsule Take 1 capsule (12.5 mg total) by mouth daily.   LANTUS SOLOSTAR 100 UNIT/ML injection Inject 12 Units into the skin at bedtime.   metoprolol tartrate (LOPRESSOR) 25 MG tablet Take 1 tablet (25 mg total) by mouth 2 (two) times daily.   nitroGLYCERIN (NITROSTAT) 0.4 MG SL tablet Place 1 tablet (0.4 mg total) under the tongue every 5 (five) minutes x 3 doses as needed for chest pain.   pantoprazole (PROTONIX) 40 MG tablet Take 40 mg by mouth every morning.   Vitamin D, Ergocalciferol, 50 MCG (2000 UT) CAPS Take 2,000 Units by mouth daily.   No facility-administered encounter medications on file as of 04/11/2023.    Allergies:  Allergies  Allergen Reactions   Levofloxacin Nausea And Vomiting    Family History: Family History  Problem Relation Age of Onset   Liver disease Mother 49       deceased   Cancer Brother        x3   Stroke Brother    Breast cancer Neg Hx     Social History: Social History   Tobacco Use   Smoking status: Never   Smokeless tobacco: Never  Substance Use Topics   Alcohol use: No   Drug use: No   Social History    Social History Narrative   Right and Left Handed    Lives in a one story home. Lives with husband.     Vital Signs:  BP (!) 166/69   Pulse (!) 115   Ht 5\' 3"  (1.6 m)   Wt 113 lb (51.3 kg)   SpO2 92%   BMI 20.02 kg/m      Neurological Exam: MENTAL STATUS including orientation to time, place, person, recent and remote memory, attention span and concentration, language, and fund of knowledge is normal.  Speech is not dysarthric.  CRANIAL NERVES: II:  No visual field defects.     III-IV-VI: Pupils equal round and reactive to light.  Normal conjugate, extra-ocular eye movements in all directions of gaze.  No nystagmus.  No ptosis.   V:  Normal facial sensation.    VII:  Normal facial symmetry and movements.   VIII:  Normal hearing and vestibular function.   IX-X:  Normal palatal movement.   XI:  Normal shoulder shrug and head rotation.   XII:  Normal tongue strength and range of motion, no deviation or fasciculation.  MOTOR:  No atrophy, fasciculations or abnormal movements.  No pronator drift.   Upper Extremity:  Right  Left  Deltoid  5/5   5/5   Biceps  5/5   5/5   Triceps  5/5   5/5   Infraspinatus 5/5  5/5  Medial pectoralis 5/5  5/5  Wrist extensors  5/5   5/5   Wrist flexors  5/5   5/5   Finger extensors  5/5   5/5   Finger flexors  5/5   5/5   Dorsal interossei  5/5   5/5   Abductor pollicis  5/5   5/5   Tone (Ashworth scale)  0  0   Lower Extremity:  Right  Left  Hip flexors  5/5   5/5   Hip extensors  5/5   5/5   Adductor 5/5  5/5  Abductor 5/5  5/5  Knee flexors  5/5   5/5   Knee extensors  5/5   5/5   Dorsiflexors  5/5   5/5   Plantarflexors  5/5   5/5   Toe extensors  5/5   5/5   Toe flexors  5/5   5/5   Tone (Ashworth scale)  0  0   MSRs:                                           Right        Left brachioradialis 2+  2+  biceps 2+  2+  triceps 2+  2+  patellar 2+  2+  ankle jerk 2+  2+  Hoffman no  no  plantar response down  down    SENSORY:  Normal and symmetric perception of light touch, pinprick, vibration, and temperature.  Romberg's sign absent.   COORDINATION/GAIT:  Mild dysmetria with finger to nose testing and heel to shin testing on the left.  Intact rapid alternating movements bilaterally.  Able to rise from a chair without using arms.  Gait appears mildly wide-based, unsteady with a cane.   Thank you for allowing me to participate in patient's care.  If I can answer any additional questions, I would be pleased to do so.    Sincerely,    Nyle Limb K. Allena Katz, DO

## 2023-04-11 NOTE — Patient Instructions (Addendum)
CTA head  Monitor your blood pressure daily when you are at home and share with your cardiologist  Start physical therapy for balance training  Start to use a rollator  Return to clinic in 4 month

## 2023-04-14 ENCOUNTER — Other Ambulatory Visit: Payer: Medicare Other

## 2023-04-17 DIAGNOSIS — R2681 Unsteadiness on feet: Secondary | ICD-10-CM | POA: Diagnosis not present

## 2023-04-19 DIAGNOSIS — R2681 Unsteadiness on feet: Secondary | ICD-10-CM | POA: Diagnosis not present

## 2023-04-26 ENCOUNTER — Other Ambulatory Visit: Payer: Self-pay | Admitting: Cardiology

## 2023-04-26 NOTE — Telephone Encounter (Signed)
Prescription refill request for Eliquis received. Indication: PE Last office visit: 06/27/22  P [place] MD Scr: 0.99 on 12/26/22  [facility] Age: 89 Weight: 53.1kg  Based on above findings Eliquis 2.5mg  twice daily is the appropriate dose.  Refill approved.

## 2023-05-02 ENCOUNTER — Encounter: Payer: Self-pay | Admitting: Cardiovascular Disease

## 2023-05-02 ENCOUNTER — Ambulatory Visit: Payer: Medicare Other | Attending: Cardiovascular Disease | Admitting: Cardiovascular Disease

## 2023-05-02 VITALS — BP 150/76 | HR 75 | Ht 63.0 in | Wt 113.2 lb

## 2023-05-02 DIAGNOSIS — I25810 Atherosclerosis of coronary artery bypass graft(s) without angina pectoris: Secondary | ICD-10-CM

## 2023-05-02 DIAGNOSIS — I10 Essential (primary) hypertension: Secondary | ICD-10-CM

## 2023-05-02 DIAGNOSIS — E785 Hyperlipidemia, unspecified: Secondary | ICD-10-CM

## 2023-05-02 DIAGNOSIS — I771 Stricture of artery: Secondary | ICD-10-CM

## 2023-05-02 NOTE — Progress Notes (Signed)
Cardiology Office Note   Date:  05/02/2023   ID:  Jennifer Warner, DOB 1934/04/14, MRN [id number]  PCP:  Georgianne Fick, MD  Cardiologist:  Dr. [place]  No chief complaint on file.     History of Present Illness: Jennifer Warner is a 89 y.o. female who was referred by Dr. Sw[place]and for evaluation of right subclavian artery stenosis known history of coronary artery disease status post CABG in 2009 with LIMA to LAD and SVG to LPL, essential hypertension, hyperlipidemia, type 2 diabetes and pulmonary embolism on anticoagulation.  She underwent recent carotid Doppler which showed moderate left carotid stenosis.  There was likely bilateral subclavian artery stenosis with evidence of retrograde flow in the right vertebral artery with dampened flow waves in the right brachial artery and at 16 mm blood pressure difference between both arms.  In spite of presence of subclavian artery stenosis, she denies arm claudication and she has no clinical symptoms of subclavian steal syndrome.  She does complain of chronic dizziness that does not seem to be related to arm use and she has had recurrent falls recently without syncope.   Past Medical History:  Diagnosis Date   Acute thyroiditis    CAD (coronary artery disease)    CAP (community acquired pneumonia) 12/2012   Essential hypertension, benign    GERD (gastroesophageal reflux disease)    History of blood transfusion 05/2008   "when I had my bypass"   Hyperkalemia    Hypertriglyceridemia    Other secondary thrombocytopenia    Polycystic ovaries    Postsurgical aortocoronary bypass status    Pure hypercholesterolemia    Type II diabetes mellitus (HCC)    Urinary tract infection, site not specified     Past Surgical History:  Procedure Laterality Date   CARDIAC CATHETERIZATION  05/2008   CARDIAC CATHETERIZATION N/A 05/03/2016   Procedure: Left Heart Cath and Coronary Angiography;  Surgeon: Lyn Records, MD;  Location: [facility] INVASIVE CV  LAB;  Service: Cardiovascular;  Laterality: N/A;   CATARACT EXTRACTION W/ INTRAOCULAR LENS  IMPLANT, BILATERAL Bilateral ~ 2000   CESAREAN SECTION  1964   CORONARY ARTERY BYPASS GRAFT  05/2008   X2, [place] to LAD, SVG to PDA   DILATION AND CURETTAGE OF UTERUS     LAPAROSCOPIC CHOLECYSTECTOMY  1990s   TUBAL LIGATION       Current Outpatient Medications  Medication Sig Dispense Refill   apixaban (ELIQUIS) 2.5 MG TABS tablet Take 1 tablet by mouth twice daily 60 tablet 5   atorvastatin (LIPITOR) 20 MG tablet Take 1 tablet by mouth once daily 90 tablet 1   diphenhydramine-acetaminophen (TYLENOL PM) 25-500 MG TABS Take 2 tablets by mouth at bedtime.     fenofibrate (TRICOR) 145 MG tablet Take 1 tablet (145 mg total) by mouth every evening. 90 tablet 3   hydrochlorothiazide (MICROZIDE) 12.5 MG capsule Take 1 capsule (12.5 mg total) by mouth daily. 90 capsule 3   LANTUS SOLOSTAR 100 UNIT/ML injection Inject 12 Units into the skin at bedtime.     metoprolol tartrate (LOPRESSOR) 25 MG tablet Take 1 tablet (25 mg total) by mouth 2 (two) times daily. 180 tablet 3   nitroGLYCERIN (NITROSTAT) 0.4 MG SL tablet Place 1 tablet (0.4 mg total) under the tongue every 5 (five) minutes x 3 doses as needed for chest pain. 25 tablet 2   pantoprazole (PROTONIX) 40 MG tablet Take 40 mg by mouth every morning.     Vitamin D, Ergocalciferol,  50 MCG (2000 UT) CAPS Take 2,000 Units by mouth daily.  1   amLODipine (NORVASC) 5 MG tablet Take 1 tablet (5 mg total) by mouth daily. 90 tablet 3   No current facility-administered medications for this visit.    Allergies:   Levofloxacin    Social History:  The patient  reports that she has never smoked. She has never used smokeless tobacco. She reports that she does not drink alcohol and does not use drugs.   Family History:  The patient's family history includes Cancer in her brother; Liver disease (age of onset: 64) in her mother; Stroke in her brother.    ROS:   Please see the history of present illness.   Otherwise, review of systems are positive for none.   All other systems are reviewed and negative.    PHYSICAL EXAM: VS:  BP (!) 150/76 (BP Location: Left Arm, Patient Position: Sitting, Cuff Size: Normal)   Pulse 75   Ht 5\' 3"  (1.6 m)   Wt 113 lb 3.2 oz (51.3 kg)   SpO2 96%   BMI 20.05 kg/m  , BMI Body mass index is 20.05 kg/m. GEN: Well nourished, well developed, in no acute distress  HEENT: normal  Neck: no JVD  or masses.  Bilateral carotid bruits Cardiac: RRR; no murmurs, rubs, or gallops,no edema  Respiratory:  clear to auscultation bilaterally, normal work of breathing GI: soft, nontender, nondistended, + BS MS: no deformity or atrophy  Skin: warm and dry, no rash Neuro:  Strength and sensation are intact Psych: euthymic mood, full affect Bilateral upper extremity pulses are palpable but diminished.   EKG:  EKG is not ordered today.    Recent Labs: 06/05/2022: BUN 28; Creatinine, Ser 0.98; Hemoglobin 12.4; Platelets 178; Potassium 4.2; Sodium 140    Lipid Panel    Component Value Date/Time   CHOL 165 08/23/2019 1023   TRIG 228 (H) 08/23/2019 1023   HDL 38 (L) 08/23/2019 1023   CHOLHDL 4.3 08/23/2019 1023   CHOLHDL 5.1 08/15/2017 0401   VLDL 56 (H) 08/15/2017 0401   LDLCALC 89 08/23/2019 1023      Wt Readings from Last 3 Encounters:  05/02/23 113 lb 3.2 oz (51.3 kg)  04/11/23 113 lb (51.3 kg)  06/27/22 117 lb (53.1 kg)           No data to display            ASSESSMENT AND PLAN:  1.  Subclavian artery stenosis: The patient likely has some degree of bilateral subclavian artery stenosis that seems to be better on the right side with associated retrograde flow in the right vertebral artery suggestive of steal.  However, she has no clinical symptoms and denies arm claudication.  No neurologic symptoms with arm use.  Thus, there is no indication for revascularization.  Continue medical therapy and check  blood pressure and left arm.  2.  Coronary artery disease status post CABG with no angina: No anginal symptoms and she feels well overall.  Continue medical therapy.  3.  Essential hypertension: Blood pressure is mildly elevated but likely reasonable considering her age and chronic dizziness.  4.  Hyperlipidemia: On atorvastatin and fenofibrate.  5.  History of pulmonary embolism on anticoagulation.  On anticoagulation with Eliquis.    Disposition:   FU with me as needed  Signed,  Lorine Bears, MD  05/02/2023 1:26 PM    [facility]

## 2023-05-02 NOTE — Patient Instructions (Signed)
 Medication Instructions:  No changes *If you need a refill on your cardiac medications before your next appointment, please call your pharmacy*   Lab Work: None ordered If you have labs (blood work) drawn today and your tests are completely normal, you will receive your results only by: MyChart Message (if you have MyChart) OR A paper copy in the mail If you have any lab test that is abnormal or we need to change your treatment, we will call you to review the results.   Testing/Procedures: None ordered   Follow-Up: At [facility], you and your health needs are our priority.  As part of our continuing mission to provide you with exceptional heart care, we have created designated Provider Care Teams.  These Care Teams include your primary Cardiologist (physician) and Advanced Practice Providers (APPs -  Physician Assistants and Nurse Practitioners) who all work together to provide you with the care you need, when you need it.  We recommend signing up for the patient portal called "MyChart".  Sign up information is provided on this After Visit Summary.  MyChart is used to connect with patients for Virtual Visits (Telemedicine).  Patients are able to view lab/test results, encounter notes, upcoming appointments, etc.  Non-urgent messages can be sent to your provider as well.   To learn more about what you can do with MyChart, go to [email or web address].    Your next appointment:   Follow up as needed with Dr. Arida 

## 2023-05-04 ENCOUNTER — Ambulatory Visit (HOSPITAL_COMMUNITY): Payer: Medicare Other

## 2023-05-08 DIAGNOSIS — R2681 Unsteadiness on feet: Secondary | ICD-10-CM | POA: Diagnosis not present

## 2023-05-11 DIAGNOSIS — R2681 Unsteadiness on feet: Secondary | ICD-10-CM | POA: Diagnosis not present

## 2023-05-17 DIAGNOSIS — R2681 Unsteadiness on feet: Secondary | ICD-10-CM | POA: Diagnosis not present

## 2023-05-19 ENCOUNTER — Ambulatory Visit (HOSPITAL_COMMUNITY): Payer: Medicare HMO | Attending: Cardiology

## 2023-05-19 DIAGNOSIS — I25708 Atherosclerosis of coronary artery bypass graft(s), unspecified, with other forms of angina pectoris: Secondary | ICD-10-CM | POA: Diagnosis not present

## 2023-05-19 DIAGNOSIS — R55 Syncope and collapse: Secondary | ICD-10-CM | POA: Insufficient documentation

## 2023-05-31 ENCOUNTER — Other Ambulatory Visit: Payer: Self-pay | Admitting: Cardiology

## 2023-06-27 ENCOUNTER — Telehealth: Payer: Self-pay

## 2023-06-27 NOTE — Telephone Encounter (Signed)
Received a call from patient she stated she woke up yesterday with pain in back of neck.No sob.No chest pain.Stated she called PCP but has not received a call.Advised she can take Tylenol and apply warm compress.Advised to see PCP if not better.

## 2023-07-13 ENCOUNTER — Other Ambulatory Visit: Payer: Self-pay | Admitting: Cardiology

## 2023-07-13 ENCOUNTER — Other Ambulatory Visit: Payer: Self-pay

## 2023-07-13 MED ORDER — AMLODIPINE BESYLATE 5 MG PO TABS: 5.0000 mg | ORAL_TABLET | Freq: Every day | ORAL | 3 refills | Status: DC

## 2023-07-13 NOTE — Progress Notes (Signed)
Cardiology Office Note   Date:  07/21/2023   ID:  Jennifer Warner, DOB [birth year], MRN [id number]  PCP:  Georgianne Fick, MD  Cardiologist:  Jaydn Fincher [place], MD EP: None  Chief Complaint  Patient presents with   Coronary Artery Disease     History of Present Illness: Jennifer Warner is a 89 y.o. female is see for follow up CAD. She has  a PMH of CAD s/p CABG in 2009 (LIMA to LAD, SVG to left posterolateral branch of the LCx), HTN, HLD, DM type 2 on insulin, PE on coumadin.  She was admitted in November 2018 for submassive PE with right heart strain though not a candidate for thrombectomy/thombolysis, for which she was started on coumadin.    Her last ischemic evaluation was a LHC in 2017 which showed patent LIMA to LAD and SVG to OM, occluded LAD, moderate nondominant RCA disease, high grade obstruction of mLCx between 1st-3rd OM branch, and moderate PDA disease which was medically managed. Her last echo was 07/2017 which showed EF 55-60%, no RWMA, mild RV systolic dysfunction and severely increased PA pressures (in the setting of PE), and moderate TR. Coumadin later switched to Eliquis.   She underwent carotid Doppler which showed moderate left carotid stenosis. There was likely bilateral subclavian artery stenosis with evidence of retrograde flow in the right vertebral artery with dampened flow waves in the right brachial artery and at 16 mm blood pressure difference between both arms. She was seen by Dr Kirke Corin who didn't feel she had any symptoms related to claudication or steal. Echo in August 2024 looked good.   On follow up today she is doing very well. No chest pain or dyspnea. Will occasionally get weak spells and just sits down. No syncope. She is still driving.   Past Medical History:  Diagnosis Date   Acute thyroiditis    CAD (coronary artery disease)    CAP (community acquired pneumonia) 12/2012   Essential hypertension, benign    GERD (gastroesophageal reflux disease)     History of blood transfusion 05/2008   "when I had my bypass"   Hyperkalemia    Hypertriglyceridemia    Other secondary thrombocytopenia    Polycystic ovaries    Postsurgical aortocoronary bypass status    Pure hypercholesterolemia    Type II diabetes mellitus (HCC)    Urinary tract infection, site not specified     Past Surgical History:  Procedure Laterality Date   CARDIAC CATHETERIZATION  05/2008   CARDIAC CATHETERIZATION N/A 05/03/2016   Procedure: Left Heart Cath and Coronary Angiography;  Surgeon: Lyn Records, MD;  Location: [facility] INVASIVE CV LAB;  Service: Cardiovascular;  Laterality: N/A;   CATARACT EXTRACTION W/ INTRAOCULAR LENS  IMPLANT, BILATERAL Bilateral ~ 2000   CESAREAN SECTION  1964   CORONARY ARTERY BYPASS GRAFT  05/2008   X2, [place] to LAD, SVG to PDA   DILATION AND CURETTAGE OF UTERUS     LAPAROSCOPIC CHOLECYSTECTOMY  1990s   TUBAL LIGATION       Current Outpatient Medications  Medication Sig Dispense Refill   amLODipine (NORVASC) 5 MG tablet Take 1 tablet (5 mg total) by mouth daily. 90 tablet 3   apixaban (ELIQUIS) 2.5 MG TABS tablet Take 1 tablet by mouth twice daily 60 tablet 5   atorvastatin (LIPITOR) 20 MG tablet Take 1 tablet by mouth once daily 90 tablet 1   diphenhydramine-acetaminophen (TYLENOL PM) 25-500 MG TABS Take 2 tablets by mouth at bedtime.  fenofibrate (TRICOR) 145 MG tablet Take 1 tablet (145 mg total) by mouth every evening. 90 tablet 3   hydrochlorothiazide (MICROZIDE) 12.5 MG capsule Take 1 capsule (12.5 mg total) by mouth daily. 90 capsule 3   LANTUS SOLOSTAR 100 UNIT/ML injection Inject 12 Units into the skin at bedtime.     metoprolol tartrate (LOPRESSOR) 25 MG tablet Take 1 tablet by mouth twice daily 180 tablet 0   nitroGLYCERIN (NITROSTAT) 0.4 MG SL tablet Place 1 tablet (0.4 mg total) under the tongue every 5 (five) minutes x 3 doses as needed for chest pain. 25 tablet 2   pantoprazole (PROTONIX) 40 MG tablet Take 40 mg by mouth  every morning.     Vitamin D, Ergocalciferol, 50 MCG (2000 UT) CAPS Take 2,000 Units by mouth daily.  1   No current facility-administered medications for this visit.    Allergies:   Levofloxacin    Social History:  The patient  reports that she has never smoked. She has never used smokeless tobacco. She reports that she does not drink alcohol and does not use drugs.   Family History:  The patient's family history includes Cancer in her brother; Liver disease (age of onset: 19) in her mother; Stroke in her brother.    ROS:  Please see the history of present illness.   Otherwise, review of systems are positive for none.   All other systems are reviewed and negative.    PHYSICAL EXAM: VS:  BP 101/61 (BP Location: Right Arm, Patient Position: Sitting, Cuff Size: Normal)   Pulse 74   Ht 5\' 3"  (1.6 m)   Wt 113 lb (51.3 kg)   SpO2 97%   BMI 20.02 kg/m  , BMI Body mass index is 20.02 kg/m. GEN: Well nourished, well developed, in no acute distress HEENT: sclera anicteric, bruising on both cheeks and small knot on right eyebrow. Neck: no JVD, + bilateral carotid bruits, or masses Cardiac: RRR; no murmurs, rubs, or gallops, no edema  Respiratory:  clear to auscultation bilaterally, normal work of breathing GI: soft, nontender, nondistended, + BS MS: no deformity or atrophy Skin: warm and dry, no rash Neuro:  Strength and sensation are intact. Alert and oriented x 3.  Psych: euthymic mood, full affect   EKG Interpretation Date/Time:  Friday July 21 2023 15:02:20 EDT Ventricular Rate:  74 PR Interval:  132 QRS Duration:  92 QT Interval:  360 QTC Calculation: 399 R Axis:   -4  Text Interpretation: Normal sinus rhythm Normal ECG When compared with ECG of 05-Jun-2022 10:06, criteria for LVH no longer present Confirmed by [place], Chara Marquard [phone]) on 07/21/2023 3:05:09 PM    Recent Labs: No results found for requested labs within last 365 days.    Lipid Panel    Component Value  Date/Time   CHOL 165 08/23/2019 1023   TRIG 228 (H) 08/23/2019 1023   HDL 38 (L) 08/23/2019 1023   CHOLHDL 4.3 08/23/2019 1023   CHOLHDL 5.1 08/15/2017 0401   VLDL 56 (H) 08/15/2017 0401   LDLCALC 89 08/23/2019 1023    Dated 08/11/20: normal LFTs Dated 10/14/20: cholesterol 157, triglycerides 219, HDL 44, LDL 77.  Dated 11/18/20: BUN 26, creatinine 1.44. otherwise BMET normal.  Dated 08/11/21: cholesterol 140, triglycerides 230, HDL 38, nonHDL 102.  Dated 12/26/22: A1c 5.4%. cholesterol 132, triglycerides 122, HDL 45, LDL 65. GFR 55.   Wt Readings from Last 3 Encounters:  07/21/23 113 lb (51.3 kg)  05/02/23 113 lb 3.2 oz (51.3  kg)  04/11/23 113 lb (51.3 kg)      Other studies Reviewed: Additional studies/ records that were reviewed today include:   Echocardiogram 07/2017 Study Conclusions   - Left ventricle: The cavity size was normal. Wall thickness was   normal. Systolic function was normal. The estimated ejection   fraction was in the range of 55% to 60%. Wall motion was normal;   there were no regional wall motion abnormalities. Doppler   parameters are consistent with elevated mean left atrial filling   pressure. - Right ventricle: The cavity size was moderately dilated. Systolic   function was mildly reduced. - Right atrium: The atrium was mildly dilated. - Tricuspid valve: There was moderate regurgitation. - Pulmonary arteries: Systolic pressure was severely increased. PA   peak pressure: 77 mm Hg (S).  Left heart catheterization 2017: Prox RCA to Mid RCA lesion, 70% stenosed. Mid Cx lesion, 80% stenosed. Ost LPDA to LPDA lesion, 70% stenosed. SVG was injected . was injected is normal in caliber, and is anatomically normal. Prox LAD to Mid LAD lesion, 100% stenosed. Ost LM to LM lesion, 70% stenosed.   Prolonged procedure, caused by failed attempt at left radial approach due to radial loop and the development of a microperforation due to diagnostic  catheter/guidewire injury. Hemostasis was achieved with compression and an Ace bandage. Saphenous vein graft to the third obtuse marginal is widely patent. The procedure was prolonged because the graft was not marked and required aortography to identify the site of origin. Widely patent LIMA to LAD. Nondominant moderately diseased, 70% mid right coronary artery. Total occlusion of the proximal native LAD. High-grade obstruction of the mid circumflex between the large first and third obtuse marginal branch. There is also 70% stenosis in the PDA. Normal left ventricular function and hemodynamics. Atrial fibrillation developed transiently, lasting approximally 3 minutes. Atrial fibrillation was incited by mechanical irritation during left ventriculography.     RECOMMENDATIONS:   Watch medial left arm for evidence of bleeding/hematoma. Monitor femoral site for evidence of bleeding. Heparin can probably be discontinued and converted to DVT prophylaxis.   Echo 05/19/23: IMPRESSIONS     1. Left ventricular ejection fraction, by estimation, is 60 to 65%. The  left ventricle has normal function. The left ventricle has no regional  wall motion abnormalities. Left ventricular diastolic parameters are  consistent with Grade I diastolic  dysfunction (impaired relaxation).   2. Right ventricular systolic function is normal. The right ventricular  size is normal. There is mildly elevated pulmonary artery systolic  pressure. The estimated right ventricular systolic pressure is 36.9 mmHg.   3. The mitral valve is normal in structure. Mild mitral valve  regurgitation. No evidence of mitral stenosis.   4. The aortic valve is normal in structure. Aortic valve regurgitation is  not visualized. No aortic stenosis is present.   5. The inferior vena cava is normal in size with greater than 50%  respiratory variability, suggesting right atrial pressure of 3 mmHg.   Carotid doppler 04/05/23: Summary:  Right  Carotid: Velocities in the right ICA are consistent with a 1-39%  stenosis.                Non-hemodynamically significant plaque <50% noted in the  CCA. The                 ECA appears >50% stenosed.   Left Carotid: Velocities in the left ICA are consistent with a 40-59%  stenosis.  Non-hemodynamically significant plaque <50% noted in the  CCA. The                ECA appears >50% stenosed. LICA stenosis based on peak  systolic               velocities.   Vertebrals:  Left vertebral artery demonstrates antegrade flow. Right  vertebral              artery demonstrates retrograde flow.  Subclavians: Bilateral subclavian artery flow was disturbed.   Possible right subclavian steal. Retrograde flow in the right vertebral  artery. The right brachial artery is dampened and biphasic; the left is  brisk and biphasic. There is a 16 mmHg pressure difference between the  arms, the right being the lowest.   *See table(s) above for measurements and observations.  Suggest follow up study in 12 months.    Electronically signed by Nanetta Batty MD on 04/05/2023 at 2:45:54 PM.       ASSESSMENT AND PLAN:  1. CAD s/p CABG in 2009: not on aspirin given need for anticoagulation.  - cardiac cath in 2017 showed patent grafts.  - No significant angina. - Continue statin and fenofibrate - Continue metoprolol  2. HTN: BP is now well controlled.  - Continue metoprolol succinate - Continue amlodipine 5 mg daily.   3. HLD/hypertriglyceridemia: LDL 65. Triglycerides 122 - Continue atorvastatin and fenofibrate  4. DM type 2: followed by Dr Talmage Nap   5. History of PE: unprovoked. On coumadin initially. Later switched to Eliquis. Tolerating well.  6. Bilateral carotid bruits. Dopplers with nonobstructive disease. Bilateral subclavian stenosis. Evaluated by Dr Kirke Corin who felt she didn't have any symptoms related to this.   7. Possible syncope. Prior event monitor was normal. No  recurrence  Disposition:   FU will follow up one year   Signed, Calyn Rubi [place], MD  07/21/2023 3:11 PM

## 2023-07-21 ENCOUNTER — Encounter: Payer: Self-pay | Admitting: Cardiology

## 2023-07-21 ENCOUNTER — Ambulatory Visit: Payer: Medicare HMO | Attending: Cardiology | Admitting: Cardiology

## 2023-07-21 VITALS — BP 101/61 | HR 74 | Ht 63.0 in | Wt 113.0 lb

## 2023-07-21 DIAGNOSIS — I10 Essential (primary) hypertension: Secondary | ICD-10-CM

## 2023-07-21 DIAGNOSIS — I771 Stricture of artery: Secondary | ICD-10-CM | POA: Diagnosis not present

## 2023-07-21 DIAGNOSIS — E785 Hyperlipidemia, unspecified: Secondary | ICD-10-CM

## 2023-07-21 DIAGNOSIS — I2699 Other pulmonary embolism without acute cor pulmonale: Secondary | ICD-10-CM | POA: Diagnosis not present

## 2023-07-21 DIAGNOSIS — I25810 Atherosclerosis of coronary artery bypass graft(s) without angina pectoris: Secondary | ICD-10-CM

## 2023-07-21 DIAGNOSIS — Z951 Presence of aortocoronary bypass graft: Secondary | ICD-10-CM | POA: Diagnosis not present

## 2023-07-21 NOTE — Patient Instructions (Signed)
Medication Instructions:  Continue same medications *If you need a refill on your cardiac medications before your next appointment, please call your pharmacy*   Lab Work: None ordered   Testing/Procedures: None ordered   Follow-Up: At [facility], you and your health needs are our priority.  As part of our continuing mission to provide you with exceptional heart care, we have created designated Provider Care Teams.  These Care Teams include your primary Cardiologist (physician) and Advanced Practice Providers (APPs -  Physician Assistants and Nurse Practitioners) who all work together to provide you with the care you need, when you need it.  We recommend signing up for the patient portal called "MyChart".  Sign up information is provided on this After Visit Summary.  MyChart is used to connect with patients for Virtual Visits (Telemedicine).  Patients are able to view lab/test results, encounter notes, upcoming appointments, etc.  Non-urgent messages can be sent to your provider as well.   To learn more about what you can do with MyChart, go to [email or web address].    Your next appointment:  1 year   Call in June to schedule Oct appointment     Provider:  Dr.Jordan

## 2023-08-04 DIAGNOSIS — D6869 Other thrombophilia: Secondary | ICD-10-CM | POA: Diagnosis not present

## 2023-08-04 DIAGNOSIS — E1122 Type 2 diabetes mellitus with diabetic chronic kidney disease: Secondary | ICD-10-CM | POA: Diagnosis not present

## 2023-08-04 DIAGNOSIS — N1831 Chronic kidney disease, stage 3a: Secondary | ICD-10-CM | POA: Diagnosis not present

## 2023-08-04 DIAGNOSIS — E210 Primary hyperparathyroidism: Secondary | ICD-10-CM | POA: Diagnosis not present

## 2023-08-04 DIAGNOSIS — I2510 Atherosclerotic heart disease of native coronary artery without angina pectoris: Secondary | ICD-10-CM | POA: Diagnosis not present

## 2023-08-21 ENCOUNTER — Ambulatory Visit: Payer: Medicare Other | Admitting: Neurology

## 2023-08-22 ENCOUNTER — Encounter: Payer: Self-pay | Admitting: Chiropractic Medicine

## 2023-08-28 DIAGNOSIS — I2510 Atherosclerotic heart disease of native coronary artery without angina pectoris: Secondary | ICD-10-CM | POA: Diagnosis not present

## 2023-08-28 DIAGNOSIS — E1122 Type 2 diabetes mellitus with diabetic chronic kidney disease: Secondary | ICD-10-CM | POA: Diagnosis not present

## 2023-08-28 DIAGNOSIS — E210 Primary hyperparathyroidism: Secondary | ICD-10-CM | POA: Diagnosis not present

## 2023-08-28 DIAGNOSIS — N1831 Chronic kidney disease, stage 3a: Secondary | ICD-10-CM | POA: Diagnosis not present

## 2023-08-28 DIAGNOSIS — I2699 Other pulmonary embolism without acute cor pulmonale: Secondary | ICD-10-CM | POA: Diagnosis not present

## 2023-09-18 ENCOUNTER — Other Ambulatory Visit: Payer: Self-pay | Admitting: Cardiology

## 2023-10-02 DIAGNOSIS — E1122 Type 2 diabetes mellitus with diabetic chronic kidney disease: Secondary | ICD-10-CM | POA: Diagnosis not present

## 2023-10-02 DIAGNOSIS — N1831 Chronic kidney disease, stage 3a: Secondary | ICD-10-CM | POA: Diagnosis not present

## 2023-10-09 DIAGNOSIS — I2699 Other pulmonary embolism without acute cor pulmonale: Secondary | ICD-10-CM | POA: Diagnosis not present

## 2023-10-09 DIAGNOSIS — E782 Mixed hyperlipidemia: Secondary | ICD-10-CM | POA: Diagnosis not present

## 2023-10-09 DIAGNOSIS — N1831 Chronic kidney disease, stage 3a: Secondary | ICD-10-CM | POA: Diagnosis not present

## 2023-10-09 DIAGNOSIS — E1122 Type 2 diabetes mellitus with diabetic chronic kidney disease: Secondary | ICD-10-CM | POA: Diagnosis not present

## 2023-10-09 DIAGNOSIS — I2510 Atherosclerotic heart disease of native coronary artery without angina pectoris: Secondary | ICD-10-CM | POA: Diagnosis not present

## 2023-10-09 DIAGNOSIS — E210 Primary hyperparathyroidism: Secondary | ICD-10-CM | POA: Diagnosis not present

## 2023-10-15 ENCOUNTER — Other Ambulatory Visit: Payer: Self-pay | Admitting: Cardiology

## 2023-10-16 ENCOUNTER — Other Ambulatory Visit: Payer: Self-pay

## 2023-10-16 MED ORDER — METOPROLOL TARTRATE 25 MG PO TABS: 25.0000 mg | ORAL_TABLET | Freq: Two times a day (BID) | ORAL | 2 refills | Status: AC

## 2023-11-07 DIAGNOSIS — E559 Vitamin D deficiency, unspecified: Secondary | ICD-10-CM | POA: Diagnosis not present

## 2023-11-07 DIAGNOSIS — M810 Age-related osteoporosis without current pathological fracture: Secondary | ICD-10-CM | POA: Diagnosis not present

## 2023-11-07 DIAGNOSIS — E210 Primary hyperparathyroidism: Secondary | ICD-10-CM | POA: Diagnosis not present

## 2023-11-07 DIAGNOSIS — N1831 Chronic kidney disease, stage 3a: Secondary | ICD-10-CM | POA: Diagnosis not present

## 2023-11-07 DIAGNOSIS — E1122 Type 2 diabetes mellitus with diabetic chronic kidney disease: Secondary | ICD-10-CM | POA: Diagnosis not present

## 2023-11-07 DIAGNOSIS — I10 Essential (primary) hypertension: Secondary | ICD-10-CM | POA: Diagnosis not present

## 2023-12-05 DIAGNOSIS — R2681 Unsteadiness on feet: Secondary | ICD-10-CM | POA: Diagnosis not present

## 2023-12-09 ENCOUNTER — Other Ambulatory Visit: Payer: Self-pay | Admitting: Cardiology

## 2023-12-11 NOTE — Telephone Encounter (Signed)
 Prescription refill request for Eliquis received. Indication:pe Last office visit:10/24 Scr:1.0  1/25 Age: 89 Weight:51.3  kg  Prescription refilled

## 2023-12-12 ENCOUNTER — Other Ambulatory Visit: Payer: Self-pay | Admitting: Cardiology

## 2023-12-19 DIAGNOSIS — R2681 Unsteadiness on feet: Secondary | ICD-10-CM | POA: Diagnosis not present

## 2023-12-26 DIAGNOSIS — R2681 Unsteadiness on feet: Secondary | ICD-10-CM | POA: Diagnosis not present

## 2024-01-08 DIAGNOSIS — E210 Primary hyperparathyroidism: Secondary | ICD-10-CM | POA: Diagnosis not present

## 2024-01-08 DIAGNOSIS — E1122 Type 2 diabetes mellitus with diabetic chronic kidney disease: Secondary | ICD-10-CM | POA: Diagnosis not present

## 2024-01-08 DIAGNOSIS — I2510 Atherosclerotic heart disease of native coronary artery without angina pectoris: Secondary | ICD-10-CM | POA: Diagnosis not present

## 2024-01-08 DIAGNOSIS — N1831 Chronic kidney disease, stage 3a: Secondary | ICD-10-CM | POA: Diagnosis not present

## 2024-01-15 DIAGNOSIS — D6869 Other thrombophilia: Secondary | ICD-10-CM | POA: Diagnosis not present

## 2024-01-15 DIAGNOSIS — Z794 Long term (current) use of insulin: Secondary | ICD-10-CM | POA: Diagnosis not present

## 2024-01-15 DIAGNOSIS — D692 Other nonthrombocytopenic purpura: Secondary | ICD-10-CM | POA: Diagnosis not present

## 2024-01-15 DIAGNOSIS — E210 Primary hyperparathyroidism: Secondary | ICD-10-CM | POA: Diagnosis not present

## 2024-01-15 DIAGNOSIS — E1122 Type 2 diabetes mellitus with diabetic chronic kidney disease: Secondary | ICD-10-CM | POA: Diagnosis not present

## 2024-01-15 DIAGNOSIS — I2699 Other pulmonary embolism without acute cor pulmonale: Secondary | ICD-10-CM | POA: Diagnosis not present

## 2024-01-15 DIAGNOSIS — N1831 Chronic kidney disease, stage 3a: Secondary | ICD-10-CM | POA: Diagnosis not present

## 2024-01-15 DIAGNOSIS — Z0000 Encounter for general adult medical examination without abnormal findings: Secondary | ICD-10-CM | POA: Diagnosis not present

## 2024-01-15 DIAGNOSIS — I10 Essential (primary) hypertension: Secondary | ICD-10-CM | POA: Diagnosis not present

## 2024-01-15 DIAGNOSIS — E7800 Pure hypercholesterolemia, unspecified: Secondary | ICD-10-CM | POA: Diagnosis not present

## 2024-01-15 DIAGNOSIS — I2510 Atherosclerotic heart disease of native coronary artery without angina pectoris: Secondary | ICD-10-CM | POA: Diagnosis not present

## 2024-01-27 ENCOUNTER — Encounter (HOSPITAL_COMMUNITY): Payer: Self-pay | Admitting: Emergency Medicine

## 2024-01-27 ENCOUNTER — Emergency Department (HOSPITAL_COMMUNITY)

## 2024-01-27 ENCOUNTER — Emergency Department (HOSPITAL_COMMUNITY)
Admission: EM | Admit: 2024-01-27 | Discharge: 2024-01-27 | Disposition: A | Discharge status: Home / Self Care | Attending: Emergency Medicine | Admitting: Emergency Medicine

## 2024-01-27 DIAGNOSIS — N189 Chronic kidney disease, unspecified: Secondary | ICD-10-CM | POA: Insufficient documentation

## 2024-01-27 DIAGNOSIS — Z7901 Long term (current) use of anticoagulants: Secondary | ICD-10-CM | POA: Insufficient documentation

## 2024-01-27 DIAGNOSIS — M5432 Sciatica, left side: Secondary | ICD-10-CM | POA: Insufficient documentation

## 2024-01-27 DIAGNOSIS — M545 Low back pain, unspecified: Secondary | ICD-10-CM | POA: Diagnosis present

## 2024-01-27 DIAGNOSIS — I2510 Atherosclerotic heart disease of native coronary artery without angina pectoris: Secondary | ICD-10-CM | POA: Insufficient documentation

## 2024-01-27 DIAGNOSIS — M51369 Other intervertebral disc degeneration, lumbar region without mention of lumbar back pain or lower extremity pain: Secondary | ICD-10-CM | POA: Diagnosis not present

## 2024-01-27 DIAGNOSIS — M48061 Spinal stenosis, lumbar region without neurogenic claudication: Secondary | ICD-10-CM | POA: Diagnosis not present

## 2024-01-27 DIAGNOSIS — E1122 Type 2 diabetes mellitus with diabetic chronic kidney disease: Secondary | ICD-10-CM | POA: Diagnosis not present

## 2024-01-27 DIAGNOSIS — M79605 Pain in left leg: Secondary | ICD-10-CM | POA: Diagnosis not present

## 2024-01-27 DIAGNOSIS — M51379 Other intervertebral disc degeneration, lumbosacral region without mention of lumbar back pain or lower extremity pain: Secondary | ICD-10-CM | POA: Diagnosis not present

## 2024-01-27 DIAGNOSIS — Z79899 Other long term (current) drug therapy: Secondary | ICD-10-CM | POA: Insufficient documentation

## 2024-01-27 DIAGNOSIS — M5442 Lumbago with sciatica, left side: Secondary | ICD-10-CM | POA: Diagnosis not present

## 2024-01-27 DIAGNOSIS — I10 Essential (primary) hypertension: Secondary | ICD-10-CM | POA: Diagnosis not present

## 2024-01-27 DIAGNOSIS — M5126 Other intervertebral disc displacement, lumbar region: Secondary | ICD-10-CM | POA: Diagnosis not present

## 2024-01-27 LAB — COMPREHENSIVE METABOLIC PANEL WITH GFR
ALT: 13 U/L (ref 0–44)
AST: 30 U/L (ref 15–41)
Albumin: 3.6 g/dL (ref 3.5–5.0)
Alkaline Phosphatase: 38 U/L (ref 38–126)
Anion gap: 10 (ref 5–15)
BUN: 25 mg/dL — ABNORMAL HIGH (ref 8–23)
CO2: 25 mmol/L (ref 22–32)
Calcium: 10.7 mg/dL — ABNORMAL HIGH (ref 8.9–10.3)
Chloride: 103 mmol/L (ref 98–111)
Creatinine, Ser: 0.97 mg/dL (ref 0.44–1.00)
GFR, Estimated: 57 mL/min — ABNORMAL LOW (ref >60)
Glucose, Bld: 89 mg/dL (ref 70–99)
Potassium: 3.9 mmol/L (ref 3.5–5.1)
Sodium: 138 mmol/L (ref 135–145)
Total Bilirubin: 1.3 mg/dL — ABNORMAL HIGH (ref 0.0–1.2)
Total Protein: 6.3 g/dL — ABNORMAL LOW (ref 6.5–8.1)

## 2024-01-27 LAB — CBC WITH DIFFERENTIAL/PLATELET
Abs Immature Granulocytes: 0 10*3/uL (ref 0.00–0.07)
Basophils Absolute: 0 10*3/uL (ref 0.0–0.1)
Basophils Relative: 1 %
Eosinophils Absolute: 0.1 10*3/uL (ref 0.0–0.5)
Eosinophils Relative: 1 %
HCT: 33.5 % — ABNORMAL LOW (ref 36.0–46.0)
Hemoglobin: 11 g/dL — ABNORMAL LOW (ref 12.0–15.0)
Immature Granulocytes: 0 %
Lymphocytes Relative: 33 %
Lymphs Abs: 1.4 10*3/uL (ref 0.7–4.0)
MCH: 32.1 pg (ref 26.0–34.0)
MCHC: 32.8 g/dL (ref 30.0–36.0)
MCV: 97.7 fL (ref 80.0–100.0)
Monocytes Absolute: 0.4 10*3/uL (ref 0.1–1.0)
Monocytes Relative: 9 %
Neutro Abs: 2.4 10*3/uL (ref 1.7–7.7)
Neutrophils Relative %: 56 %
Platelets: 153 10*3/uL (ref 150–400)
RBC: 3.43 MIL/uL — ABNORMAL LOW (ref 3.87–5.11)
RDW: 13.2 % (ref 11.5–15.5)
WBC: 4.3 10*3/uL (ref 4.0–10.5)
nRBC: 0 % (ref 0.0–0.2)

## 2024-01-27 MED ORDER — IBUPROFEN 200 MG PO TABS
400.0000 mg | ORAL_TABLET | Freq: Once | ORAL | Status: AC
  Administered 2024-01-27: 400 mg via ORAL
  Filled 2024-01-27: qty 2

## 2024-01-27 MED ORDER — IBUPROFEN 400 MG PO TABS: 400.0000 mg | ORAL_TABLET | Freq: Four times a day (QID) | ORAL | 0 refills | Status: DC | PRN

## 2024-01-27 NOTE — ED Provider Notes (Signed)
 [place] EMERGENCY DEPARTMENT AT [facility] Provider Note   CSN: [id number] Arrival date & time: 01/27/24  1354     History Chief Complaint  Patient presents with   Leg Pain    Jennifer Warner is a 89 y.o. female.  Patient with past history significant for CKD, diabetes, coronary artery disease presents to the emergency department concerns of left pain.  She reports that she has had radiating pain from her left low back/hip into the left lower leg for about 6 days.   Leg Pain      Home Medications Prior to Admission medications   Medication Sig Start Date End Date Taking? Authorizing Provider  ibuprofen (ADVIL) 400 MG tablet Take 1 tablet (400 mg total) by mouth every 6 (six) hours as needed. 01/27/24  Yes Plummer Matich A, PA-C  amLODipine (NORVASC) 5 MG tablet Take 1 tablet (5 mg total) by mouth daily. 07/13/23   [place], Peter M, MD  apixaban Herby Lolling) 2.5 MG TABS tablet Take 1 tablet by mouth twice daily 12/12/23   Jordan, Peter M, MD  atorvastatin (LIPITOR) 20 MG tablet Take 1 tablet by mouth once daily 09/21/23   [place], Peter M, MD  diphenhydramine-acetaminophen (TYLENOL PM) 25-500 MG TABS Take 2 tablets by mouth at bedtime.    [provider]  fenofibrate (TRICOR) 145 MG tablet Take 1 tablet (145 mg total) by mouth every evening. 05/31/23   [place], Peter M, MD  hydrochlorothiazide (MICROZIDE) 12.5 MG capsule Take 1 capsule (12.5 mg total) by mouth daily. 03/11/22   [place], Peter M, MD  LANTUS SOLOSTAR 100 UNIT/ML injection Inject 12 Units into the skin at bedtime. 09/22/12   [provider]  metoprolol tartrate (LOPRESSOR) 25 MG tablet Take 1 tablet (25 mg total) by mouth 2 (two) times daily. 10/16/23   [place], Peter M, MD  nitroGLYCERIN (NITROSTAT) 0.4 MG SL tablet Place 1 tablet (0.4 mg total) under the tongue every 5 (five) minutes x 3 doses as needed for chest pain. 08/07/19   Kroeger, Krista M., PA-C  pantoprazole (PROTONIX) 40 MG tablet Take 40  mg by mouth every morning.    [provider]  Vitamin D, Ergocalciferol, 50 MCG (2000 UT) CAPS Take 2,000 Units by mouth daily. 04/04/16   [provider]      Allergies    Levofloxacin    Review of Systems   Review of Systems  Musculoskeletal:        Leg pain  All other systems reviewed and are negative.   Physical Exam Updated Vital Signs BP (!) 114/91   Pulse 84   Temp 98.3 F (36.8 C) (Oral)   Resp 16   SpO2 100%  Physical Exam Vitals and nursing note reviewed.  Constitutional:      General: She is not in acute distress.    Appearance: She is well-developed.  HENT:     Head: Normocephalic and atraumatic.  Eyes:     Conjunctiva/sclera: Conjunctivae normal.  Cardiovascular:     Rate and Rhythm: Normal rate and regular rhythm.     Heart sounds: No murmur heard. Pulmonary:     Effort: Pulmonary effort is normal. No respiratory distress.     Breath sounds: Normal breath sounds.  Abdominal:     Palpations: Abdomen is soft.     Tenderness: There is no abdominal tenderness.  Musculoskeletal:        General: Tenderness present. No swelling.       Arms:  Cervical back: Neck supple.     Comments: TTP in the left lower back/hip. Positive straight leg raise on right side with worsened pain with dorsiflexion of the foot.  Skin:    General: Skin is warm and dry.     Capillary Refill: Capillary refill takes less than 2 seconds.  Neurological:     Mental Status: She is alert.  Psychiatric:        Mood and Affect: Mood normal.     ED Results / Procedures / Treatments   Labs (all labs ordered are listed, but only abnormal results are displayed) Labs Reviewed  COMPREHENSIVE METABOLIC PANEL WITH GFR - Abnormal; Notable for the following components:      Result Value   BUN 25 (*)    Calcium 10.7 (*)    Total Protein 6.3 (*)    Total Bilirubin 1.3 (*)    GFR, Estimated 57 (*)    All other components within normal limits  CBC WITH  DIFFERENTIAL/PLATELET - Abnormal; Notable for the following components:   RBC 3.43 (*)    Hemoglobin 11.0 (*)    HCT 33.5 (*)    All other components within normal limits  CBC WITH DIFFERENTIAL/PLATELET    EKG None  Radiology CT Lumbar Spine Wo Contrast Result Date: 01/27/2024 CLINICAL DATA:  Back and left leg pain for 6 days. EXAM: CT LUMBAR SPINE WITHOUT CONTRAST TECHNIQUE: Multidetector CT imaging of the lumbar spine was performed without intravenous contrast administration. Multiplanar CT image reconstructions were also generated. RADIATION DOSE REDUCTION: This exam was performed according to the departmental dose-optimization program which includes automated exposure control, adjustment of the mA and/or kV according to patient size and/or use of iterative reconstruction technique. COMPARISON:  Lumbar spine MRI from 2021 FINDINGS: Segmentation: There are five lumbar type vertebral bodies. The last full intervertebral disc space is labeled L5-S1. Alignment: Normal overall alignment of the lumbar vertebral bodies. Slight degenerative retrolisthesis of L2 and anterolisthesis of L4. Vertebrae: Partial auto interbody fusion changes at L3-4. This could be due to advanced disc disease or possible prior trauma or infection. Mild diffuse osteoporosis is noted but no worrisome bone lesions or acute fractures. No pars defects. Paraspinal and other soft tissues: No significant paraspinal or retroperitoneal findings. There is advanced vascular disease but no aortic aneurysm. Colonic diverticulosis noted. Disc levels: L1-2: Mild bulging annulus and moderate facet disease but no significant spinal or foraminal stenosis. L2-3: Diffuse annular bulge, osteophytic ridging, facet disease and ligamentum flavum thickening contributing to mild multifactorial spinal stenosis and moderate bilateral lateral recess stenosis, right greater than left. No significant foraminal stenosis. L3-4: Severe chronic appearing  degenerative disc disease with early auto fusion changes. Findings could be due to prior trauma or infection. There is a diffuse bulging degenerated annulus and a moderate to large partially calcified central disc protrusion. This in combination with facet disease and ligamentum flavum thickening creates severe spinal and bilateral lateral recess stenosis. No foraminal stenosis. L4-5: Bulging uncovered disc, facet disease and ligamentum flavum thickening in conjunction with a central and slight left paracentral disc protrusion contributes to severe spinal and bilateral lateral recess stenosis. No foraminal stenosis. L5-S1: Bulging degenerated annulus and advanced facet disease but no significant spinal or foraminal stenosis. IMPRESSION: 1. Severe multifactorial spinal and bilateral lateral recess stenosis at L4-5 and L5-S1 as detailed above. No foraminal stenosis. 2. Mild multifactorial spinal stenosis and moderate bilateral lateral recess stenosis, right greater than left at L2-3. 3. No acute bony findings. Electronically Signed  By: Marrian Siva M.D.   On: 01/27/2024 16:47    Procedures Procedures    Medications Ordered in ED Medications  ibuprofen (ADVIL) tablet 400 mg (400 mg Oral Given 01/27/24 1553)    ED Course/ Medical Decision Making/ A&P                                 Medical Decision Making Amount and/or Complexity of Data Reviewed Labs: ordered. Radiology: ordered.  Risk OTC drugs. Prescription drug management.   This patient presents to the ED for concern of leg pain.  Differential diagnosis includes sciatica, cauda equina syndrome, DVT, muscle spasming   Lab Tests:  I Ordered, and personally interpreted labs.  The pertinent results include: CBC with notable anemia, CMP unremarkable   Imaging Studies ordered:  I ordered imaging studies including CT lumbar spine I independently visualized and interpreted imaging which showed 1. Severe multifactorial spinal and  bilateral lateral recess stenosis at L4-5 and L5-S1 as detailed above. No foraminal stenosis. 2. Mild multifactorial spinal stenosis and moderate bilateral lateral recess stenosis, right greater than left at L2-3. 3. No acute bony findings. I agree with the radiologist interpretation   Medicines ordered and prescription drug management:  I ordered medication including ibuprofen for pain Reevaluation of the patient after these medicines showed that the patient improved I have reviewed the patients home medicines and have made adjustments as needed   Problem List / ED Course:  Patient presents emergency department today with concerns of left-sided leg pain started about 1 week ago.  She reports that the pain radiates from her left low back into the toes.  She denies any feelings of weakness or numbness.  Denies any history of sciatic pain similar to this.  No saddle paresthesia, bowel or bladder incontinence.  Denies any recent injury or trauma to the area. Physical exam reveals a positive straight leg raise on the left side worsened with dorsiflexion of the left foot.  Some tenderness palpation in the left hip/low back.  Given patient's age, obtain CT imaging to rule out any possible traumatic injury that may be causing some of this pain.  Will obtain basic labs for further assessment. Labs and imaging were thankfully reassuring.  There is severe multifactorial spinal and bilateral lateral recess stenosis at the L4-L5 and L5-S1.  No traumatic injury noted.  Given reassuring findings, this does appear to likely be sciatic type pain.  Advised patient should continue use of anti-inflammatory medications at home for the next 2 weeks and gentle stretching of the area.  Return precautions discussed such as concerns for new or worsening symptoms.  Thankfully no evidence of any red flag symptoms for low back pain such as history of malignancy, IV drug use, recent steroid use, or cauda equina type syndrome.   Advise return precautions.  Patient otherwise stable and discharged home with plans for outpatient follow-up.   Final Clinical Impression(s) / ED Diagnoses Final diagnoses:  Sciatica of left side    Rx / DC Orders ED Discharge Orders          Ordered    ibuprofen (ADVIL) 400 MG tablet  Every 6 hours PRN        01/27/24 1807              Dusten Ellinwood A, PA-C 01/27/24 1954    Horton, Kristie M, DO 01/28/24 1310

## 2024-01-27 NOTE — Discharge Instructions (Addendum)
 You were seen in the ER today for concerns of left leg pain starting from the hip going through the leg. Your labs were reassuring and your CT scan did show multiple vertebral levels with some level of degeneration. With your physical exam, this is most consistent with sciatic type pain on your left side. Management typically includes use of anti-inflammatory medications such as ibuprofen for continued pain control. Please use this as advised. Follow up with your primary care provider. Return to the ER for concerns for new or worsening symptoms.

## 2024-01-27 NOTE — ED Triage Notes (Signed)
 Pt arrived via EMS from home for left leg pain onset 6 days ago, starts at hip and goes down leg. Feels like sciatica pain. Tried to contact PCP, not return calls. No recent falls. Normally walks with cane.  Vitals 156/60 80 hr 98%

## 2024-02-08 DIAGNOSIS — M48062 Spinal stenosis, lumbar region with neurogenic claudication: Secondary | ICD-10-CM | POA: Diagnosis not present

## 2024-02-21 DIAGNOSIS — M48062 Spinal stenosis, lumbar region with neurogenic claudication: Secondary | ICD-10-CM | POA: Diagnosis not present

## 2024-02-26 ENCOUNTER — Emergency Department (HOSPITAL_COMMUNITY)

## 2024-02-26 ENCOUNTER — Other Ambulatory Visit: Payer: Self-pay

## 2024-02-26 ENCOUNTER — Encounter (HOSPITAL_COMMUNITY)
Admission: EM | Disposition: A | Discharge status: Home Health | Payer: Self-pay | Source: Home / Self Care | Attending: Internal Medicine

## 2024-02-26 ENCOUNTER — Encounter (HOSPITAL_COMMUNITY): Payer: Self-pay | Admitting: Cardiology

## 2024-02-26 ENCOUNTER — Inpatient Hospital Stay (HOSPITAL_COMMUNITY)
Admission: EM | Admit: 2024-02-26 | Discharge: 2024-03-01 | DRG: 871 | Disposition: A | Discharge status: Home Health | Attending: Internal Medicine | Admitting: Internal Medicine

## 2024-02-26 DIAGNOSIS — Z951 Presence of aortocoronary bypass graft: Secondary | ICD-10-CM | POA: Diagnosis not present

## 2024-02-26 DIAGNOSIS — K219 Gastro-esophageal reflux disease without esophagitis: Secondary | ICD-10-CM | POA: Diagnosis not present

## 2024-02-26 DIAGNOSIS — Z86718 Personal history of other venous thrombosis and embolism: Secondary | ICD-10-CM | POA: Diagnosis not present

## 2024-02-26 DIAGNOSIS — K2980 Duodenitis without bleeding: Secondary | ICD-10-CM

## 2024-02-26 DIAGNOSIS — M5432 Sciatica, left side: Secondary | ICD-10-CM | POA: Diagnosis present

## 2024-02-26 DIAGNOSIS — K8689 Other specified diseases of pancreas: Secondary | ICD-10-CM | POA: Diagnosis not present

## 2024-02-26 DIAGNOSIS — R6521 Severe sepsis with septic shock: Principal | ICD-10-CM | POA: Diagnosis present

## 2024-02-26 DIAGNOSIS — E119 Type 2 diabetes mellitus without complications: Secondary | ICD-10-CM | POA: Diagnosis not present

## 2024-02-26 DIAGNOSIS — R001 Bradycardia, unspecified: Secondary | ICD-10-CM | POA: Diagnosis not present

## 2024-02-26 DIAGNOSIS — K7689 Other specified diseases of liver: Secondary | ICD-10-CM | POA: Diagnosis not present

## 2024-02-26 DIAGNOSIS — E781 Pure hyperglyceridemia: Secondary | ICD-10-CM | POA: Diagnosis not present

## 2024-02-26 DIAGNOSIS — K5750 Diverticulosis of both small and large intestine without perforation or abscess without bleeding: Secondary | ICD-10-CM | POA: Diagnosis not present

## 2024-02-26 DIAGNOSIS — E785 Hyperlipidemia, unspecified: Secondary | ICD-10-CM | POA: Diagnosis present

## 2024-02-26 DIAGNOSIS — E876 Hypokalemia: Secondary | ICD-10-CM | POA: Diagnosis not present

## 2024-02-26 DIAGNOSIS — I70212 Atherosclerosis of native arteries of extremities with intermittent claudication, left leg: Secondary | ICD-10-CM | POA: Diagnosis not present

## 2024-02-26 DIAGNOSIS — Z7901 Long term (current) use of anticoagulants: Secondary | ICD-10-CM | POA: Diagnosis not present

## 2024-02-26 DIAGNOSIS — Z794 Long term (current) use of insulin: Secondary | ICD-10-CM | POA: Diagnosis not present

## 2024-02-26 DIAGNOSIS — Z79899 Other long term (current) drug therapy: Secondary | ICD-10-CM | POA: Diagnosis not present

## 2024-02-26 DIAGNOSIS — K859 Acute pancreatitis without necrosis or infection, unspecified: Secondary | ICD-10-CM | POA: Diagnosis not present

## 2024-02-26 DIAGNOSIS — Z881 Allergy status to other antibiotic agents status: Secondary | ICD-10-CM

## 2024-02-26 DIAGNOSIS — L89321 Pressure ulcer of left buttock, stage 1: Secondary | ICD-10-CM | POA: Diagnosis not present

## 2024-02-26 DIAGNOSIS — I700 Atherosclerosis of aorta: Secondary | ICD-10-CM | POA: Diagnosis not present

## 2024-02-26 DIAGNOSIS — I2510 Atherosclerotic heart disease of native coronary artery without angina pectoris: Secondary | ICD-10-CM | POA: Diagnosis not present

## 2024-02-26 DIAGNOSIS — Z800 Family history of malignant neoplasm of digestive organs: Secondary | ICD-10-CM

## 2024-02-26 DIAGNOSIS — L8990 Pressure ulcer of unspecified site, unspecified stage: Secondary | ICD-10-CM | POA: Insufficient documentation

## 2024-02-26 DIAGNOSIS — R509 Fever, unspecified: Secondary | ICD-10-CM | POA: Diagnosis present

## 2024-02-26 DIAGNOSIS — I10 Essential (primary) hypertension: Secondary | ICD-10-CM | POA: Diagnosis present

## 2024-02-26 DIAGNOSIS — Z86711 Personal history of pulmonary embolism: Secondary | ICD-10-CM | POA: Diagnosis not present

## 2024-02-26 DIAGNOSIS — I2489 Other forms of acute ischemic heart disease: Secondary | ICD-10-CM

## 2024-02-26 DIAGNOSIS — R0689 Other abnormalities of breathing: Secondary | ICD-10-CM | POA: Diagnosis not present

## 2024-02-26 DIAGNOSIS — R933 Abnormal findings on diagnostic imaging of other parts of digestive tract: Secondary | ICD-10-CM | POA: Diagnosis not present

## 2024-02-26 DIAGNOSIS — B9620 Unspecified Escherichia coli [E. coli] as the cause of diseases classified elsewhere: Secondary | ICD-10-CM

## 2024-02-26 DIAGNOSIS — A4151 Sepsis due to Escherichia coli [E. coli]: Secondary | ICD-10-CM | POA: Diagnosis present

## 2024-02-26 DIAGNOSIS — Z1152 Encounter for screening for COVID-19: Secondary | ICD-10-CM

## 2024-02-26 DIAGNOSIS — M79605 Pain in left leg: Secondary | ICD-10-CM | POA: Diagnosis not present

## 2024-02-26 DIAGNOSIS — I2782 Chronic pulmonary embolism: Secondary | ICD-10-CM | POA: Diagnosis not present

## 2024-02-26 DIAGNOSIS — I213 ST elevation (STEMI) myocardial infarction of unspecified site: Secondary | ICD-10-CM

## 2024-02-26 DIAGNOSIS — E7800 Pure hypercholesterolemia, unspecified: Secondary | ICD-10-CM | POA: Diagnosis present

## 2024-02-26 DIAGNOSIS — I21A1 Myocardial infarction type 2: Secondary | ICD-10-CM | POA: Diagnosis not present

## 2024-02-26 DIAGNOSIS — R579 Shock, unspecified: Secondary | ICD-10-CM

## 2024-02-26 DIAGNOSIS — R9431 Abnormal electrocardiogram [ECG] [EKG]: Secondary | ICD-10-CM

## 2024-02-26 DIAGNOSIS — Z823 Family history of stroke: Secondary | ICD-10-CM

## 2024-02-26 DIAGNOSIS — A419 Sepsis, unspecified organism: Secondary | ICD-10-CM | POA: Diagnosis not present

## 2024-02-26 DIAGNOSIS — R7881 Bacteremia: Secondary | ICD-10-CM | POA: Diagnosis not present

## 2024-02-26 DIAGNOSIS — E872 Acidosis, unspecified: Secondary | ICD-10-CM | POA: Diagnosis not present

## 2024-02-26 DIAGNOSIS — R410 Disorientation, unspecified: Secondary | ICD-10-CM | POA: Diagnosis not present

## 2024-02-26 DIAGNOSIS — D709 Neutropenia, unspecified: Secondary | ICD-10-CM | POA: Diagnosis present

## 2024-02-26 DIAGNOSIS — R1010 Upper abdominal pain, unspecified: Secondary | ICD-10-CM | POA: Diagnosis not present

## 2024-02-26 DIAGNOSIS — R932 Abnormal findings on diagnostic imaging of liver and biliary tract: Secondary | ICD-10-CM | POA: Diagnosis not present

## 2024-02-26 DIAGNOSIS — N390 Urinary tract infection, site not specified: Secondary | ICD-10-CM | POA: Diagnosis present

## 2024-02-26 DIAGNOSIS — R918 Other nonspecific abnormal finding of lung field: Secondary | ICD-10-CM | POA: Diagnosis not present

## 2024-02-26 DIAGNOSIS — R600 Localized edema: Secondary | ICD-10-CM | POA: Diagnosis not present

## 2024-02-26 DIAGNOSIS — I959 Hypotension, unspecified: Secondary | ICD-10-CM | POA: Diagnosis not present

## 2024-02-26 DIAGNOSIS — Z9049 Acquired absence of other specified parts of digestive tract: Secondary | ICD-10-CM

## 2024-02-26 HISTORY — PX: LEFT HEART CATH AND CORS/GRAFTS ANGIOGRAPHY: CATH118250

## 2024-02-26 LAB — LIPID PANEL
Cholesterol: 118 mg/dL (ref 0–200)
HDL: 46 mg/dL (ref >40)
LDL Cholesterol: 52 mg/dL (ref 0–99)
Total CHOL/HDL Ratio: 2.6 ratio
Triglycerides: 100 mg/dL (ref <150)
VLDL: 20 mg/dL (ref 0–40)

## 2024-02-26 LAB — URINALYSIS, W/ REFLEX TO CULTURE (INFECTION SUSPECTED)
Bacteria, UA: NONE SEEN
Bilirubin Urine: NEGATIVE
Glucose, UA: NEGATIVE mg/dL
Hgb urine dipstick: NEGATIVE
Ketones, ur: NEGATIVE mg/dL
Leukocytes,Ua: NEGATIVE
Nitrite: NEGATIVE
Protein, ur: NEGATIVE mg/dL
Specific Gravity, Urine: 1.01 (ref 1.005–1.030)
pH: 6 (ref 5.0–8.0)

## 2024-02-26 LAB — COMPREHENSIVE METABOLIC PANEL WITH GFR
ALT: 39 U/L (ref 0–44)
AST: 160 U/L — ABNORMAL HIGH (ref 15–41)
Albumin: 3.1 g/dL — ABNORMAL LOW (ref 3.5–5.0)
Alkaline Phosphatase: 78 U/L (ref 38–126)
Anion gap: 12 (ref 5–15)
BUN: 19 mg/dL (ref 8–23)
CO2: 21 mmol/L — ABNORMAL LOW (ref 22–32)
Calcium: 10 mg/dL (ref 8.9–10.3)
Chloride: 107 mmol/L (ref 98–111)
Creatinine, Ser: 0.99 mg/dL (ref 0.44–1.00)
GFR, Estimated: 55 mL/min — ABNORMAL LOW (ref >60)
Glucose, Bld: 100 mg/dL — ABNORMAL HIGH (ref 70–99)
Potassium: 3.8 mmol/L (ref 3.5–5.1)
Sodium: 140 mmol/L (ref 135–145)
Total Bilirubin: 1.7 mg/dL — ABNORMAL HIGH (ref 0.0–1.2)
Total Protein: 5.6 g/dL — ABNORMAL LOW (ref 6.5–8.1)

## 2024-02-26 LAB — BLOOD CULTURE ID PANEL (REFLEXED) - BCID2

## 2024-02-26 LAB — PROTIME-INR
INR: 1.2 (ref 0.8–1.2)
Prothrombin Time: 14.9 s (ref 11.4–15.2)

## 2024-02-26 LAB — LIPASE, BLOOD: Lipase: >2800 U/L — ABNORMAL HIGH (ref 11–51)

## 2024-02-26 LAB — I-STAT CG4 LACTIC ACID, ED
Lactic Acid, Venous: 2.7 mmol/L (ref 0.5–1.9)
Lactic Acid, Venous: 3.7 mmol/L (ref 0.5–1.9)

## 2024-02-26 LAB — TROPONIN I (HIGH SENSITIVITY)
Troponin I (High Sensitivity): 20 ng/L — ABNORMAL HIGH (ref <18)
Troponin I (High Sensitivity): 57 ng/L — ABNORMAL HIGH (ref <18)

## 2024-02-26 LAB — CBC WITH DIFFERENTIAL/PLATELET
Abs Immature Granulocytes: 0 10*3/uL (ref 0.00–0.07)
Basophils Absolute: 0 10*3/uL (ref 0.0–0.1)
Basophils Relative: 0 %
Eosinophils Absolute: 0 10*3/uL (ref 0.0–0.5)
Eosinophils Relative: 1 %
HCT: 32.9 % — ABNORMAL LOW (ref 36.0–46.0)
Hemoglobin: 10.8 g/dL — ABNORMAL LOW (ref 12.0–15.0)
Immature Granulocytes: 0 %
Lymphocytes Relative: 8 %
Lymphs Abs: 0.2 10*3/uL — ABNORMAL LOW (ref 0.7–4.0)
MCH: 32.2 pg (ref 26.0–34.0)
MCHC: 32.8 g/dL (ref 30.0–36.0)
MCV: 98.2 fL (ref 80.0–100.0)
Monocytes Absolute: 0 10*3/uL — ABNORMAL LOW (ref 0.1–1.0)
Monocytes Relative: 1 %
Neutro Abs: 2.5 10*3/uL (ref 1.7–7.7)
Neutrophils Relative %: 90 %
Platelets: 155 10*3/uL (ref 150–400)
RBC: 3.35 MIL/uL — ABNORMAL LOW (ref 3.87–5.11)
RDW: 14.1 % (ref 11.5–15.5)
WBC: 2.8 10*3/uL — ABNORMAL LOW (ref 4.0–10.5)
nRBC: 0 % (ref 0.0–0.2)

## 2024-02-26 LAB — HEMOGLOBIN A1C
Hgb A1c MFr Bld: 5 % (ref 4.8–5.6)
Mean Plasma Glucose: 97 mg/dL

## 2024-02-26 LAB — I-STAT CHEM 8, ED
BUN: 20 mg/dL (ref 8–23)
Calcium, Ion: 1.34 mmol/L (ref 1.15–1.40)
Chloride: 106 mmol/L (ref 98–111)
Creatinine, Ser: 1 mg/dL (ref 0.44–1.00)
Glucose, Bld: 95 mg/dL (ref 70–99)
HCT: 32 % — ABNORMAL LOW (ref 36.0–46.0)
Hemoglobin: 10.9 g/dL — ABNORMAL LOW (ref 12.0–15.0)
Potassium: 3.8 mmol/L (ref 3.5–5.1)
Sodium: 140 mmol/L (ref 135–145)
TCO2: 23 mmol/L (ref 22–32)

## 2024-02-26 LAB — GLUCOSE, CAPILLARY
Glucose-Capillary: 108 mg/dL — ABNORMAL HIGH (ref 70–99)
Glucose-Capillary: 109 mg/dL — ABNORMAL HIGH (ref 70–99)
Glucose-Capillary: 111 mg/dL — ABNORMAL HIGH (ref 70–99)
Glucose-Capillary: 120 mg/dL — ABNORMAL HIGH (ref 70–99)
Glucose-Capillary: 167 mg/dL — ABNORMAL HIGH (ref 70–99)
Glucose-Capillary: 88 mg/dL (ref 70–99)

## 2024-02-26 LAB — RESP PANEL BY RT-PCR (RSV, FLU A&B, COVID)  RVPGX2
Influenza A by PCR: NEGATIVE
Influenza B by PCR: NEGATIVE
Resp Syncytial Virus by PCR: NEGATIVE
SARS Coronavirus 2 by RT PCR: NEGATIVE

## 2024-02-26 LAB — APTT
aPTT: 27 s (ref 24–36)
aPTT: 193 s (ref 24–36)
aPTT: >200 s (ref 24–36)
aPTT: >200 s (ref 24–36)

## 2024-02-26 LAB — LACTIC ACID, PLASMA
Lactic Acid, Venous: 3.1 mmol/L (ref 0.5–1.9)
Lactic Acid, Venous: 3.4 mmol/L (ref 0.5–1.9)
Lactic Acid, Venous: 1.8 mmol/L (ref 0.5–1.9)

## 2024-02-26 LAB — MRSA NEXT GEN BY PCR, NASAL: MRSA by PCR Next Gen: NOT DETECTED

## 2024-02-26 SURGERY — LEFT HEART CATH AND CORS/GRAFTS ANGIOGRAPHY
Anesthesia: LOCAL

## 2024-02-26 MED ORDER — ONDANSETRON HCL 4 MG/2ML IJ SOLN: 4.0000 mg | Freq: Four times a day (QID) | INTRAMUSCULAR | Status: DC | PRN

## 2024-02-26 MED ORDER — FENTANYL CITRATE PF 50 MCG/ML IJ SOSY
50.0000 ug | PREFILLED_SYRINGE | Freq: Once | INTRAMUSCULAR | Status: AC
  Administered 2024-02-26: 25 ug via INTRAVENOUS
  Filled 2024-02-26: qty 1

## 2024-02-26 MED ORDER — ASPIRIN 81 MG PO CHEW
324.0000 mg | CHEWABLE_TABLET | Freq: Once | ORAL | Status: AC
  Administered 2024-02-26: 324 mg via ORAL

## 2024-02-26 MED ORDER — HEPARIN SODIUM (PORCINE) 5000 UNIT/ML IJ SOLN
60.0000 [IU]/kg | Freq: Once | INTRAMUSCULAR | Status: AC
  Administered 2024-02-26: 3100 [IU] via INTRAVENOUS

## 2024-02-26 MED ORDER — FENTANYL CITRATE (PF) 100 MCG/2ML IJ SOLN
INTRAMUSCULAR | Status: AC
  Filled 2024-02-26: qty 2

## 2024-02-26 MED ORDER — SODIUM CHLORIDE 0.9 % IV SOLN
2.0000 g | Freq: Once | INTRAVENOUS | Status: AC
  Administered 2024-02-26: 2 g via INTRAVENOUS
  Filled 2024-02-26: qty 20

## 2024-02-26 MED ORDER — SODIUM CHLORIDE 0.9 % IV SOLN: INTRAVENOUS | Status: DC

## 2024-02-26 MED ORDER — ENOXAPARIN SODIUM 30 MG/0.3ML IJ SOSY: 30.0000 mg | PREFILLED_SYRINGE | INTRAMUSCULAR | Status: DC

## 2024-02-26 MED ORDER — LACTATED RINGERS IV SOLN: INTRAVENOUS | Status: AC

## 2024-02-26 MED ORDER — IOHEXOL 350 MG/ML SOLN
75.0000 mL | Freq: Once | INTRAVENOUS | Status: AC | PRN
  Administered 2024-02-26: 75 mL via INTRAVENOUS

## 2024-02-26 MED ORDER — POLYETHYLENE GLYCOL 3350 17 G PO PACK: 17.0000 g | PACK | Freq: Every day | ORAL | Status: DC | PRN

## 2024-02-26 MED ORDER — HYDRALAZINE HCL 20 MG/ML IJ SOLN: 10.0000 mg | INTRAMUSCULAR | Status: AC | PRN

## 2024-02-26 MED ORDER — NOREPINEPHRINE BITARTRATE 1 MG/ML IV SOLN
INTRAVENOUS | Status: DC | PRN
  Administered 2024-02-26: 10 ug/min via INTRAVENOUS

## 2024-02-26 MED ORDER — MIDAZOLAM HCL 2 MG/2ML IJ SOLN
INTRAMUSCULAR | Status: AC
Start: 2024-02-26 — End: ?
  Filled 2024-02-26: qty 2

## 2024-02-26 MED ORDER — CHLORHEXIDINE GLUCONATE CLOTH 2 % EX PADS
6.0000 | MEDICATED_PAD | Freq: Every day | CUTANEOUS | Status: DC
  Administered 2024-02-26 – 2024-02-27 (×2): 6 via TOPICAL

## 2024-02-26 MED ORDER — SODIUM CHLORIDE 0.9 % IV SOLN: INTRAVENOUS | Status: AC

## 2024-02-26 MED ORDER — HEPARIN (PORCINE) IN NACL 2000-0.9 UNIT/L-% IV SOLN
INTRAVENOUS | Status: DC | PRN
  Administered 2024-02-26 (×2): 1000 mL

## 2024-02-26 MED ORDER — DOCUSATE SODIUM 100 MG PO CAPS: 100.0000 mg | ORAL_CAPSULE | Freq: Two times a day (BID) | ORAL | Status: DC | PRN

## 2024-02-26 MED ORDER — ACETAMINOPHEN 325 MG PO TABS
650.0000 mg | ORAL_TABLET | ORAL | Status: DC | PRN
  Administered 2024-02-26: 650 mg via ORAL
  Filled 2024-02-26: qty 2

## 2024-02-26 MED ORDER — MIDAZOLAM HCL 2 MG/2ML IJ SOLN
INTRAMUSCULAR | Status: DC | PRN
  Administered 2024-02-26: 25 mg via INTRAVENOUS

## 2024-02-26 MED ORDER — SODIUM CHLORIDE 0.9 % IV SOLN
INTRAVENOUS | Status: DC | PRN
  Administered 2024-02-26: 10 mL/h via INTRAVENOUS

## 2024-02-26 MED ORDER — LACTATED RINGERS IV BOLUS
1000.0000 mL | Freq: Once | INTRAVENOUS | Status: AC
Start: 2024-02-26 — End: 2024-02-26
  Administered 2024-02-26: 1000 mL via INTRAVENOUS

## 2024-02-26 MED ORDER — HEPARIN (PORCINE) 25000 UT/250ML-% IV SOLN
650.0000 [IU]/h | INTRAVENOUS | Status: DC
  Administered 2024-02-27: 650 [IU]/h via INTRAVENOUS

## 2024-02-26 MED ORDER — LABETALOL HCL 5 MG/ML IV SOLN: 10.0000 mg | INTRAVENOUS | Status: AC | PRN

## 2024-02-26 MED ORDER — SODIUM CHLORIDE 0.9 % IV SOLN: 250.0000 mL | INTRAVENOUS | Status: DC

## 2024-02-26 MED ORDER — HEPARIN (PORCINE) 25000 UT/250ML-% IV SOLN
800.0000 [IU]/h | INTRAVENOUS | Status: DC
  Administered 2024-02-26: 800 [IU]/h via INTRAVENOUS
  Filled 2024-02-26: qty 250

## 2024-02-26 MED ORDER — LACTATED RINGERS IV BOLUS
1000.0000 mL | Freq: Once | INTRAVENOUS | Status: AC
  Administered 2024-02-26: 1000 mL via INTRAVENOUS

## 2024-02-26 MED ORDER — INSULIN ASPART 100 UNIT/ML IJ SOLN
0.0000 [IU] | INTRAMUSCULAR | Status: DC
Start: 2024-02-26 — End: 2024-03-01
  Administered 2024-02-26: 2 [IU] via SUBCUTANEOUS
  Administered 2024-02-29: 1 [IU] via SUBCUTANEOUS

## 2024-02-26 MED ORDER — LIDOCAINE HCL (PF) 1 % IJ SOLN
INTRAMUSCULAR | Status: DC | PRN
  Administered 2024-02-26: 15 mL

## 2024-02-26 MED ORDER — LACTATED RINGERS IV BOLUS (SEPSIS)
1000.0000 mL | Freq: Once | INTRAVENOUS | Status: AC
  Administered 2024-02-26: 1000 mL via INTRAVENOUS

## 2024-02-26 MED ORDER — FENTANYL CITRATE (PF) 100 MCG/2ML IJ SOLN
INTRAMUSCULAR | Status: DC | PRN
  Administered 2024-02-26: 12.5 ug via INTRAVENOUS

## 2024-02-26 MED ORDER — NOREPINEPHRINE 4 MG/250ML-% IV SOLN
2.0000 ug/min | INTRAVENOUS | Status: DC
  Administered 2024-02-26: 2 ug/min via INTRAVENOUS

## 2024-02-26 MED ORDER — SODIUM CHLORIDE 0.9% FLUSH: 3.0000 mL | INTRAVENOUS | Status: DC | PRN

## 2024-02-26 MED ORDER — SODIUM CHLORIDE 0.9 % IV SOLN: 250.0000 mL | INTRAVENOUS | Status: DC | PRN

## 2024-02-26 MED ORDER — SODIUM CHLORIDE 0.9 % IV SOLN
2.0000 g | INTRAVENOUS | Status: DC
  Administered 2024-02-26 – 2024-02-28 (×3): 2 g via INTRAVENOUS
  Filled 2024-02-26 (×3): qty 20

## 2024-02-26 MED ORDER — ORAL CARE MOUTH RINSE: 15.0000 mL | OROMUCOSAL | Status: DC | PRN

## 2024-02-26 MED ORDER — SODIUM CHLORIDE 0.9% FLUSH
3.0000 mL | Freq: Two times a day (BID) | INTRAVENOUS | Status: DC
  Administered 2024-02-26 – 2024-03-01 (×8): 3 mL via INTRAVENOUS

## 2024-02-26 MED ORDER — LIDOCAINE HCL (PF) 1 % IJ SOLN
INTRAMUSCULAR | Status: AC
  Filled 2024-02-26: qty 30

## 2024-02-26 MED ORDER — ASPIRIN 81 MG PO CHEW: 324.0000 mg | CHEWABLE_TABLET | Freq: Once | ORAL | Status: DC

## 2024-02-26 SURGICAL SUPPLY — 14 items
CATH INFINITI 5 FR AR1 MOD (CATHETERS) IMPLANT
CATH INFINITI 5 FR IM (CATHETERS) IMPLANT
CATH INFINITI 5FR MULTPACK ANG (CATHETERS) IMPLANT
CLOSURE MYNX CONTROL 6F/7F (Vascular Products) IMPLANT
ELECT DEFIB PAD ADLT CADENCE (PAD) IMPLANT
KIT MICROPUNCTURE NIT STIFF (SHEATH) IMPLANT
PACK CARDIAC CATHETERIZATION (CUSTOM PROCEDURE TRAY) ×1 IMPLANT
SET ATX-X65L (MISCELLANEOUS) IMPLANT
SHEATH PINNACLE 6F 10CM (SHEATH) IMPLANT
SHEATH PINNACLE 8F 10CM (SHEATH) IMPLANT
SHEATH PROBE COVER 6X72 (BAG) IMPLANT
STATION PROTECTION PRESSURIZED (MISCELLANEOUS) IMPLANT
WIRE EMERALD 3MM-J .035X150CM (WIRE) IMPLANT
WIRE EMERALD 3MM-J .035X260CM (WIRE) IMPLANT

## 2024-02-26 NOTE — ED Triage Notes (Signed)
 Pt BIB [facility] from home.  EMS was called out for CP but denies.  Pt is Altered with fever and has dark and foul odor urine.  Pt had BP of 84/56 upon Ems arrival.  EMS gave 1 liter LR and now  BP 130/90

## 2024-02-26 NOTE — ED Notes (Signed)
 Zoll pads placed; monitored on Zoll.

## 2024-02-26 NOTE — H&P (Signed)
 NAME:  Jennifer Warner, MRN:  [id number], DOB:  [birth year], LOS: 0 ADMISSION DATE:  02/26/2024, CONSULTATION DATE:  02/26/24 REFERRING MD:  cardiology, CHIEF COMPLAINT:  abdominal pain   History of Present Illness:  89 yo female presented to ed with code stemi. Pt states that her pain began yesterday evening around 1700. She states that nothing seemed to improve it. She states that it did progressively worsen to the point she needed to go to ED. She qualifies the pain that starts in her chest and migrates down to her abdomen, dull in nature. Pt also states she last ate yesterday evening before the pain started and thought at first that is was 2/2 her meal. + nausea, shortness of breath. No vomiting, diarrhea, diaphoresis. She states she is feeling much better now.   Upon presentation she was noted to be hypotensive, EKG abnormal prompting LHC which showed patent grafts.   Pt denies any change in medications. States BS have been stable, she denies any alcohol use  Pertinent  Medical History  H/o cad s/p cabg 2009 Htn Hyperlipidemia T2dm with hyperglycemia Pe on chronic a/c Chronic normocytic anemia  Significant Hospital Events: Including procedures, antibiotic start and stop dates in addition to other pertinent events   5/12 LHC and admission  Interim History / Subjective:    Objective    Blood pressure 115/75, pulse (!) 0, temperature 100.1 F (37.8 C), temperature source Rectal, resp. rate (!) 31, height 5\' 3"  (1.6 m), weight 51.3 kg, SpO2 100%.        Intake/Output Summary (Last 24 hours) at 02/26/2024 0557 Last data filed at 02/26/2024 0403 Gross per 24 hour  Intake 3100 ml  Output --  Net 3100 ml   Filed Weights   02/26/24 0149  Weight: 51.3 kg    Examination: General: laying supine in bed in nad, chatting with staff HENT: ncat eomi, perrla, mm dry and pale Lungs: ctab Cardiovascular: irreg irreg Abdomen: soft +guarding, no rebound Extremities: no c/c/e Neuro:  awake, no focal deficits GU: deferred  Resolved Hospital Problem list     Assessment & Plan:  Acute pancreatitis Elevated AST Elevated bilirubin Abdominal pain Cad s/p cabg, patent grafts on LHC Possible sepsis with uti Lactic acidosis Transient hypotension Chronic pe -Blood cultures -urine cx -cta negative for acute dissection + for inflammation and edema of the pancreas/stomach and duodenum -add IVF -triglycerides are within range -check A1c but BS within range, ssi q4 until able to take po -cont zofran  for nausea -on chronic eliquis , held with recent cath  Best Practice (right click and "Reselect all SmartList Selections" daily)   Diet/type: NPO DVT prophylaxis systemic heparin  Pressure ulcer(s): present on admission  GI prophylaxis: H2B Lines: N/A Foley:  Yes, and it is still needed Code Status:  full code Last date of multidisciplinary goals of care discussion [5/12 with pt]  Labs   CBC: Recent Labs  Lab 02/26/24 0212 02/26/24 0219  WBC 2.8*  --   NEUTROABS 2.5  --   HGB 10.8* 10.9*  HCT 32.9* 32.0*  MCV 98.2  --   PLT 155  --     Basic Metabolic Panel: Recent Labs  Lab 02/26/24 0212 02/26/24 0219  NA 140 140  K 3.8 3.8  CL 107 106  CO2 21*  --   GLUCOSE 100* 95  BUN 19 20  CREATININE 0.99 1.00  CALCIUM  10.0  --    GFR: Estimated Creatinine Clearance: 32.1 mL/min (by C-G formula based on  SCr of 1 mg/dL). Recent Labs  Lab 02/26/24 0212 02/26/24 0220 02/26/24 0401 02/26/24 0426  WBC 2.8*  --   --   --   LATICACIDVEN 3.1* 2.7* 3.4* 3.7*    Liver Function Tests: Recent Labs  Lab 02/26/24 0212  AST 160*  ALT 39  ALKPHOS 78  BILITOT 1.7*  PROT 5.6*  ALBUMIN  3.1*   Recent Labs  Lab 02/26/24 0212  LIPASE >2,800*   No results for input(s): "AMMONIA" in the last 168 hours.  ABG    Component Value Date/Time   TCO2 23 02/26/2024 0219     Coagulation Profile: Recent Labs  Lab 02/26/24 0212  INR 1.2    Cardiac  Enzymes: No results for input(s): "CKTOTAL", "CKMB", "CKMBINDEX", "TROPONINI" in the last 168 hours.  HbA1C: Hgb A1c MFr Bld  Date/Time Value Ref Range Status  08/15/2017 04:01 AM 6.1 (H) 4.8 - 5.6 % Final    Comment:    (NOTE) Pre diabetes:          5.7%-6.4% Diabetes:              >6.4% Glycemic control for   <7.0% adults with diabetes   05/02/2016 07:23 PM 6.6 (H) 4.8 - 5.6 % Final    Comment:    (NOTE)         Pre-diabetes: 5.7 - 6.4         Diabetes: >6.4         Glycemic control for adults with diabetes: <7.0     CBG: Recent Labs  Lab 02/26/24 0541  GLUCAP 108*    Review of Systems:   As per HPI  Past Medical History:  She,  has a past medical history of Acute thyroiditis, CAD (coronary artery disease), CAP (community acquired pneumonia) (12/2012), Essential hypertension, benign, GERD (gastroesophageal reflux disease), History of blood transfusion (05/2008), Hyperkalemia, Hypertriglyceridemia, Other secondary thrombocytopenia, Polycystic ovaries, Postsurgical aortocoronary bypass status, Pure hypercholesterolemia, Type II diabetes mellitus (HCC), and Urinary tract infection, site not specified.   Surgical History:   Past Surgical History:  Procedure Laterality Date   CARDIAC CATHETERIZATION  05/2008   CARDIAC CATHETERIZATION N/A 05/03/2016   Procedure: Left Heart Cath and Coronary Angiography;  Surgeon: Arty Binning, MD;  Location: [facility] INVASIVE CV LAB;  Service: Cardiovascular;  Laterality: N/A;   CATARACT EXTRACTION W/ INTRAOCULAR LENS  IMPLANT, BILATERAL Bilateral ~ 2000   CESAREAN SECTION  1964   CORONARY ARTERY BYPASS GRAFT  05/2008   X2, [place] to LAD, SVG to PDA   DILATION AND CURETTAGE OF UTERUS     LAPAROSCOPIC CHOLECYSTECTOMY  1990s   TUBAL LIGATION       Social History:   reports that she has never smoked. She has never used smokeless tobacco. She reports that she does not drink alcohol and does not use drugs.   Family History:  Her family history  includes Cancer in her brother; Liver disease (age of onset: 65) in her mother; Stroke in her brother. There is no history of Breast cancer.   Allergies Allergies  Allergen Reactions   Levofloxacin  Nausea And Vomiting     Home Medications  Prior to Admission medications   Medication Sig Start Date End Date Taking? Authorizing Provider  amLODipine  (NORVASC ) 5 MG tablet Take 1 tablet (5 mg total) by mouth daily. 07/13/23   [place], Peter M, MD  apixaban  (ELIQUIS ) 2.5 MG TABS tablet Take 1 tablet by mouth twice daily 12/12/23   [place], Peter M, MD  atorvastatin  (LIPITOR) 20 MG tablet Take 1 tablet by mouth once daily 09/21/23   [place], Peter M, MD  diphenhydramine -acetaminophen  (TYLENOL  PM) 25-500 MG TABS Take 2 tablets by mouth at bedtime.    [provider]  fenofibrate  (TRICOR ) 145 MG tablet Take 1 tablet (145 mg total) by mouth every evening. 05/31/23   [place], Peter M, MD  hydrochlorothiazide  (MICROZIDE ) 12.5 MG capsule Take 1 capsule (12.5 mg total) by mouth daily. 03/11/22   [place], Peter M, MD  ibuprofen  (ADVIL ) 400 MG tablet Take 1 tablet (400 mg total) by mouth every 6 (six) hours as needed. 01/27/24   Zelaya, Oscar A, PA-C  LANTUS  SOLOSTAR 100 UNIT/ML injection Inject 12 Units into the skin at bedtime. 09/22/12   [provider]  metoprolol  tartrate (LOPRESSOR ) 25 MG tablet Take 1 tablet (25 mg total) by mouth 2 (two) times daily. 10/16/23   [place], Peter M, MD  nitroGLYCERIN  (NITROSTAT ) 0.4 MG SL tablet Place 1 tablet (0.4 mg total) under the tongue every 5 (five) minutes x 3 doses as needed for chest pain. 08/07/19   Kroeger, Krista M., PA-C  pantoprazole  (PROTONIX ) 40 MG tablet Take 40 mg by mouth every morning.    [provider]  Vitamin D, Ergocalciferol, 50 MCG (2000 UT) CAPS Take 2,000 Units by mouth daily. 04/04/16   [provider]     Critical care time: 19

## 2024-02-26 NOTE — ED Notes (Signed)
 Patient transported to CT monitored by RN; CODE medical placed.

## 2024-02-26 NOTE — ED Provider Notes (Signed)
 [place] EMERGENCY DEPARTMENT AT [facility] Provider Note   CSN: [id number] Arrival date & time: 02/26/24  0145     History  Chief Complaint  Patient presents with   Fever    LACONYA FUELL is a 89 y.o. female.  89 y.o. female is see for follow up CAD. She has  a PMH of CAD s/p CABG in 2009 (LIMA to LAD, SVG to left posterolateral branch of the LCx), HTN, HLD, DM type 2 on insulin , PE on eliquis .  Presents by EMS with chest pain.  States he developed central chest pain about 5 PM that radiates to her bilateral shoulders and abdomen.  This pain is fairly constant but waxes and wanes in severity.  EMS found her to have a blood pressure of 84 systolic and thought she could be septic as she was confused and reportedly had a fever.  On arrival however she is afebrile and she is oriented x 3.  EMS gave her 1 L of fluid and blood pressure has improved to 130/90.  She complains of pain in her chest that radiates to her abdomen and bilateral shoulders.  Some associated shortness of breath.  Nausea but no vomiting.  No diaphoresis. No pain with urination or blood in the urine.  She has had ongoing left-sided sciatica pain for several weeks which is unchanged today.  She complains of severe pain to her left hip that radiates down her leg.  Some weakness and numbness in the leg with difficulty walking.  Denies any bowel or bladder incontinence.  Denies any fever or vomiting.  Patient reports her left-sided sciatica pain has been progressively worsening.  She saw the spine specialist who placed her on gabapentin but her walking has been getting worse and she is needing to use her walker.  Denies any bowel or bladder incontinence.  Denies any fever but has a temp of 100.1 on arrival.  Denies any pain with urination or blood in the urine.  States she called EMS to because the pain in her leg was very severe and she been having constant chest pain that radiates to her abdomen on and off since  about 5 PM.  This does not feel like any, pain she has had in the past.  The history is provided by the patient and the EMS personnel.  Fever Associated symptoms: chest pain and myalgias   Associated symptoms: no congestion, no dysuria, no headaches, no nausea, no rash and no vomiting        Home Medications Prior to Admission medications   Medication Sig Start Date End Date Taking? Authorizing Provider  amLODipine  (NORVASC ) 5 MG tablet Take 1 tablet (5 mg total) by mouth daily. 07/13/23   [place], Peter M, MD  apixaban  (ELIQUIS ) 2.5 MG TABS tablet Take 1 tablet by mouth twice daily 12/12/23   [place], Peter M, MD  atorvastatin  (LIPITOR) 20 MG tablet Take 1 tablet by mouth once daily 09/21/23   [place], Peter M, MD  diphenhydramine -acetaminophen  (TYLENOL  PM) 25-500 MG TABS Take 2 tablets by mouth at bedtime.    [provider]  fenofibrate  (TRICOR ) 145 MG tablet Take 1 tablet (145 mg total) by mouth every evening. 05/31/23   [place], Peter M, MD  hydrochlorothiazide  (MICROZIDE ) 12.5 MG capsule Take 1 capsule (12.5 mg total) by mouth daily. 03/11/22   [place], Peter M, MD  ibuprofen  (ADVIL ) 400 MG tablet Take 1 tablet (400 mg total) by mouth every 6 (six) hours as needed. 01/27/24  Zelaya, Oscar A, PA-C  LANTUS  SOLOSTAR 100 UNIT/ML injection Inject 12 Units into the skin at bedtime. 09/22/12   [provider]  metoprolol  tartrate (LOPRESSOR ) 25 MG tablet Take 1 tablet (25 mg total) by mouth 2 (two) times daily. 10/16/23   [place], Peter M, MD  nitroGLYCERIN  (NITROSTAT ) 0.4 MG SL tablet Place 1 tablet (0.4 mg total) under the tongue every 5 (five) minutes x 3 doses as needed for chest pain. 08/07/19   Kroeger, Krista M., PA-C  pantoprazole  (PROTONIX ) 40 MG tablet Take 40 mg by mouth every morning.    [provider]  Vitamin D, Ergocalciferol, 50 MCG (2000 UT) CAPS Take 2,000 Units by mouth daily. 04/04/16   [provider]      Allergies    Levofloxacin     Review  of Systems   Review of Systems  Constitutional:  Positive for fever. Negative for activity change and appetite change.  HENT:  Negative for congestion.   Respiratory:  Positive for chest tightness and shortness of breath.   Cardiovascular:  Positive for chest pain.  Gastrointestinal:  Positive for abdominal pain. Negative for nausea and vomiting.  Genitourinary:  Negative for dysuria and hematuria.  Musculoskeletal:  Positive for arthralgias, back pain and myalgias.  Skin:  Negative for rash.  Neurological:  Positive for weakness. Negative for dizziness and headaches.   all other systems are negative except as noted in the HPI and PMH.    Physical Exam Updated Vital Signs BP 92/71 (BP Location: Left Arm)   Pulse (!) 110   Temp 98.8 F (37.1 C) (Axillary)   Resp 16   Ht 5\' 3"  (1.6 m)   Wt 51.3 kg   SpO2 94%   BMI 20.03 kg/m  Physical Exam Vitals and nursing note reviewed.  Constitutional:      General: She is not in acute distress.    Appearance: She is well-developed.  HENT:     Head: Normocephalic and atraumatic.     Mouth/Throat:     Pharynx: No oropharyngeal exudate.  Eyes:     Conjunctiva/sclera: Conjunctivae normal.     Pupils: Pupils are equal, round, and reactive to light.  Neck:     Comments: No meningismus. Cardiovascular:     Rate and Rhythm: Normal rate and regular rhythm.     Heart sounds: Normal heart sounds. No murmur heard. Pulmonary:     Effort: Pulmonary effort is normal. No respiratory distress.     Breath sounds: Normal breath sounds.  Abdominal:     Palpations: Abdomen is soft.     Tenderness: There is abdominal tenderness. There is no guarding or rebound.     Comments: Diffuse tenderness No guarding or rebound  Musculoskeletal:        General: Tenderness present. Normal range of motion.     Cervical back: Normal range of motion and neck supple.     Comments: 5/5 strength of lower extremity on the right.  Pain with any attempted range of  motion of left lower extremity.  Able to wiggle toes Intact DP pulse.  Skin:    General: Skin is warm.  Neurological:     Mental Status: She is alert and oriented to person, place, and time.     Cranial Nerves: No cranial nerve deficit.     Motor: No abnormal muscle tone.     Coordination: Coordination normal.     Comments:  5/5 strength throughout. CN 2-12 intact.Equal grip strength.   Psychiatric:  Behavior: Behavior normal.     ED Results / Procedures / Treatments   Labs (all labs ordered are listed, but only abnormal results are displayed) Labs Reviewed  COMPREHENSIVE METABOLIC PANEL WITH GFR - Abnormal; Notable for the following components:      Result Value   CO2 21 (*)    Glucose, Bld 100 (*)    Total Protein 5.6 (*)    Albumin  3.1 (*)    AST 160 (*)    Total Bilirubin 1.7 (*)    GFR, Estimated 55 (*)    All other components within normal limits  CBC WITH DIFFERENTIAL/PLATELET - Abnormal; Notable for the following components:   WBC 2.8 (*)    RBC 3.35 (*)    Hemoglobin 10.8 (*)    HCT 32.9 (*)    Lymphs Abs 0.2 (*)    Monocytes Absolute 0.0 (*)    All other components within normal limits  LACTIC ACID, PLASMA - Abnormal; Notable for the following components:   Lactic Acid, Venous 3.1 (*)    All other components within normal limits  LACTIC ACID, PLASMA - Abnormal; Notable for the following components:   Lactic Acid, Venous 3.4 (*)    All other components within normal limits  LIPASE, BLOOD - Abnormal; Notable for the following components:   Lipase >2,800 (*)    All other components within normal limits  GLUCOSE, CAPILLARY - Abnormal; Notable for the following components:   Glucose-Capillary 108 (*)    All other components within normal limits  I-STAT CG4 LACTIC ACID, ED - Abnormal; Notable for the following components:   Lactic Acid, Venous 2.7 (*)    All other components within normal limits  I-STAT CHEM 8, ED - Abnormal; Notable for the following  components:   Hemoglobin 10.9 (*)    HCT 32.0 (*)    All other components within normal limits  I-STAT CG4 LACTIC ACID, ED - Abnormal; Notable for the following components:   Lactic Acid, Venous 3.7 (*)    All other components within normal limits  TROPONIN I (HIGH SENSITIVITY) - Abnormal; Notable for the following components:   Troponin I (High Sensitivity) 20 (*)    All other components within normal limits  TROPONIN I (HIGH SENSITIVITY) - Abnormal; Notable for the following components:   Troponin I (High Sensitivity) 57 (*)    All other components within normal limits  RESP PANEL BY RT-PCR (RSV, FLU A&B, COVID)  RVPGX2  CULTURE, BLOOD (ROUTINE X 2)  CULTURE, BLOOD (ROUTINE X 2)  MRSA NEXT GEN BY PCR, NASAL  PROTIME-INR  URINALYSIS, W/ REFLEX TO CULTURE (INFECTION SUSPECTED)  APTT  LIPID PANEL  HEMOGLOBIN A1C  APTT  URINALYSIS, W/ REFLEX TO CULTURE (INFECTION SUSPECTED)    EKG EKG Interpretation Date/Time:  Monday Feb 26 2024 02:05:25 EDT Ventricular Rate:  88 PR Interval:  132 QRS Duration:  100 QT Interval:  356 QTC Calculation: 431 R Axis:   69  Text Interpretation: Sinus rhythm No significant change was found Confirmed by Earma Gloss [phone]) on 02/26/2024 2:17:59 AM  Radiology CARDIAC CATHETERIZATION Addendum Date: 02/26/2024 Coronary and bypass graft angiography 02/26/2024: LM: Normal LAD: Prox 100% occlusion Lcx: Dominant vessel. 100% mid vessel occlusion RCA: Small nondominant vessel with moderate disease LIMA-LAD: Patent, no significant disease SVG-OM: Patent, no significant disease LVEDP could not be calculated No acute cardiac cause to explain circulatory shock BP much better in the cath lab, therefore norepinephrine turned off Will need medical admission for management of  likely pancreatitis. Last dose of Eliquis  was 5/11 AM. Will need anticoagulation after 7:30 (2 hrs since Mynx placement) Will place order for heparin . Cody Das, MD   Result Date:  02/26/2024 Coronary and bypass graft angiography 02/26/2024: LM: Normal LAD: Prox 100% occlusion Lcx: Dominant vessel. 100% mid vessel occlusion RCA: Small nondominant vessel with moderate disease LIMA-LAD: Patent, no significant disease SVG-OM: Patent, no significant disease LVEDP could not be calculated No acute cardiac cause to explain circulatory shock BP much better in the cath lab, therefore norepinephrine turned off Will need medical admission for management of likely pancreatitis  CT Angio Aortobifemoral W and/or Wo Contrast Result Date: 02/26/2024 CLINICAL DATA:  Claudication or leg ischemia. EXAM: CT ANGIOGRAPHY OF THORACOABDOMINAL AORTA WITH ILIOFEMORAL RUNOFF TECHNIQUE: Multidetector CT imaging of the chest, abdomen, pelvis and lower extremities was performed using the standard protocol during bolus administration of intravenous contrast. Multiplanar CT image reconstructions and MIPs were obtained to evaluate the vascular anatomy. RADIATION DOSE REDUCTION: This exam was performed according to the departmental dose-optimization program which includes automated exposure control, adjustment of the mA and/or kV according to patient size and/or use of iterative reconstruction technique. CONTRAST:  75mL OMNIPAQUE  IOHEXOL  350 MG/ML SOLN COMPARISON:  None Available. FINDINGS: CHEST No cardiac enlargement or pericardial effusion. Extensive atheromatous calcification of the aorta and coronaries with CABG. LIMA and a venous graft are enhancing. No aortic dilatation, dissection, or intramural hematoma. Small sliding hiatal hernia.  No mass, adenopathy, or hematoma. There is no edema, consolidation, effusion, or pneumothorax. No acute or aggressive osseous finding VASCULAR Aorta: Confluent atheromatous plaque, primarily calcified Celiac: Prominent plaque at the bifurcation with over 50% stenosis by sagittal reformats. No emergent finding SMA: Plaque at the ostium without significant stenosis. No beading, branch  occlusion, or ulcer. Renals: Atheromatous calcification at the renal artery ostia. There could be hemodynamically significant stenosis on the right. No beading or aneurysm. IMA: Patent RIGHT Lower Extremity Inflow: Atheromatous plaque without dissection, aneurysm, or flow reducing stenosis. Outflow: Generalized atheromatous plaque in the SFA without focal and flow reducing stenosis. Bulky calcified plaque at the above the knee popliteal artery with high-grade stenosis. Runoff: Confluent calcification which limits visualization of luminal enhancement. As well, the scan likely out paces the bolus when comparing to the right. There is patency at least to the tibioperoneal trunk. LEFT Lower Extremity Inflow: Bulky calcified plaque at the proximal common iliac artery causes advanced stenosis, patent lumen difficult to even measure on coronal reformats. No dissection or aneurysm. Outflow: Notable plaque in the SFA just beyond the common femoral bifurcation which is likely at least 50% based on coronal reformats as marked on series 11. SFA atheromatous calcification is widespread with accentuated calcified plaque at the popliteal artery where there is an additional flow reducing stenosis on 9:243 Runoff: Limited by bolus timing and heavily calcified arteries, there is at least patency to the tibioperoneal trunk. No filling of the anterior tibial artery throughout the leg. Veins: Negative Review of the MIP images confirms the above findings. NON-VASCULAR Hepatobiliary: No focal liver abnormality.Cholecystectomy. No biliary dilatation. Pancreas: Expanded head with regional edema. No ductal dilatation or visible mass. Spleen: Unremarkable. Adrenals/Urinary Tract: Negative adrenals. No hydronephrosis or stone. Unremarkable bladder. Stomach/Bowel: Low-density thickening of the distal stomach and duodenum wall with regional edema. Mid duodenal diverticula. Innumerable colonic diverticula. Lymphatic: No mass or adenopathy.  Reproductive:Hysterectomy Other: No ascites or pneumoperitoneum. Tiny fatty left groin hernia. Musculoskeletal: No acute abnormalities. Lumbar spine degeneration focally severe at L3-4. Non  worrisome enchondroma in the left distal femur. IMPRESSION: Negative for acute aortic syndrome. No emergent arterial finding in the lower extremities, limited below the knees due to degree of calcified peripheral vascular disease and bolus timing. Chronic flow reducing atheromatous stenoses in the left common iliac, left SFA, and bilateral popliteal arteries. Acute edematous pancreatitis and/or duodenitis. Electronically Signed   By: Ronnette Coke M.D.   On: 02/26/2024 04:55   CT ANGIO CHEST AORTA W/CM &/OR WO/CM Result Date: 02/26/2024 CLINICAL DATA:  Claudication or leg ischemia. EXAM: CT ANGIOGRAPHY OF THORACOABDOMINAL AORTA WITH ILIOFEMORAL RUNOFF TECHNIQUE: Multidetector CT imaging of the chest, abdomen, pelvis and lower extremities was performed using the standard protocol during bolus administration of intravenous contrast. Multiplanar CT image reconstructions and MIPs were obtained to evaluate the vascular anatomy. RADIATION DOSE REDUCTION: This exam was performed according to the departmental dose-optimization program which includes automated exposure control, adjustment of the mA and/or kV according to patient size and/or use of iterative reconstruction technique. CONTRAST:  75mL OMNIPAQUE  IOHEXOL  350 MG/ML SOLN COMPARISON:  None Available. FINDINGS: CHEST No cardiac enlargement or pericardial effusion. Extensive atheromatous calcification of the aorta and coronaries with CABG. LIMA and a venous graft are enhancing. No aortic dilatation, dissection, or intramural hematoma. Small sliding hiatal hernia.  No mass, adenopathy, or hematoma. There is no edema, consolidation, effusion, or pneumothorax. No acute or aggressive osseous finding VASCULAR Aorta: Confluent atheromatous plaque, primarily calcified Celiac:  Prominent plaque at the bifurcation with over 50% stenosis by sagittal reformats. No emergent finding SMA: Plaque at the ostium without significant stenosis. No beading, branch occlusion, or ulcer. Renals: Atheromatous calcification at the renal artery ostia. There could be hemodynamically significant stenosis on the right. No beading or aneurysm. IMA: Patent RIGHT Lower Extremity Inflow: Atheromatous plaque without dissection, aneurysm, or flow reducing stenosis. Outflow: Generalized atheromatous plaque in the SFA without focal and flow reducing stenosis. Bulky calcified plaque at the above the knee popliteal artery with high-grade stenosis. Runoff: Confluent calcification which limits visualization of luminal enhancement. As well, the scan likely out paces the bolus when comparing to the right. There is patency at least to the tibioperoneal trunk. LEFT Lower Extremity Inflow: Bulky calcified plaque at the proximal common iliac artery causes advanced stenosis, patent lumen difficult to even measure on coronal reformats. No dissection or aneurysm. Outflow: Notable plaque in the SFA just beyond the common femoral bifurcation which is likely at least 50% based on coronal reformats as marked on series 11. SFA atheromatous calcification is widespread with accentuated calcified plaque at the popliteal artery where there is an additional flow reducing stenosis on 9:243 Runoff: Limited by bolus timing and heavily calcified arteries, there is at least patency to the tibioperoneal trunk. No filling of the anterior tibial artery throughout the leg. Veins: Negative Review of the MIP images confirms the above findings. NON-VASCULAR Hepatobiliary: No focal liver abnormality.Cholecystectomy. No biliary dilatation. Pancreas: Expanded head with regional edema. No ductal dilatation or visible mass. Spleen: Unremarkable. Adrenals/Urinary Tract: Negative adrenals. No hydronephrosis or stone. Unremarkable bladder. Stomach/Bowel:  Low-density thickening of the distal stomach and duodenum wall with regional edema. Mid duodenal diverticula. Innumerable colonic diverticula. Lymphatic: No mass or adenopathy. Reproductive:Hysterectomy Other: No ascites or pneumoperitoneum. Tiny fatty left groin hernia. Musculoskeletal: No acute abnormalities. Lumbar spine degeneration focally severe at L3-4. Non worrisome enchondroma in the left distal femur. IMPRESSION: Negative for acute aortic syndrome. No emergent arterial finding in the lower extremities, limited below the knees due to degree of calcified peripheral  vascular disease and bolus timing. Chronic flow reducing atheromatous stenoses in the left common iliac, left SFA, and bilateral popliteal arteries. Acute edematous pancreatitis and/or duodenitis. Electronically Signed   By: Ronnette Coke M.D.   On: 02/26/2024 04:55   DG Chest Portable 1 View Result Date: 02/26/2024 CLINICAL DATA:  Initial evaluation for acute sepsis. EXAM: PORTABLE CHEST 1 VIEW COMPARISON:  Prior study from 08/14/2017. FINDINGS: Median sternotomy wires with underlying surgical clips. Transverse heart size within normal limits. Mediastinal silhouette normal. Aortic atherosclerosis. Lungs are hypoinflated. Streaky opacities within the mid and right upper lung, likely atelectasis and/or scarring. No consolidative airspace disease. No pulmonary edema or pleural effusion. No pneumothorax. Visualized osseous structures demonstrate no acute finding. Osteopenia noted. IMPRESSION: 1. Streaky opacities within the mid and right upper lung, likely atelectasis and/or scarring. 2. No other active cardiopulmonary disease. Electronically Signed   By: Virgia Griffins M.D.   On: 02/26/2024 04:22    Procedures .Critical Care  Performed by: Earma Gloss, MD Authorized by: Earma Gloss, MD   Critical care provider statement:    Critical care time (minutes):  90   Critical care time was exclusive of:  Separately billable  procedures and treating other patients   Critical care was necessary to treat or prevent imminent or life-threatening deterioration of the following conditions:  Sepsis and shock   Critical care was time spent personally by me on the following activities:  Development of treatment plan with patient or surrogate, discussions with consultants, evaluation of patient's response to treatment, examination of patient, ordering and review of laboratory studies, ordering and review of radiographic studies, ordering and performing treatments and interventions, pulse oximetry, re-evaluation of patient's condition, review of old charts, blood draw for specimens and obtaining history from patient or surrogate   I assumed direction of critical care for this patient from another provider in my specialty: no     Care discussed with: admitting provider       Medications Ordered in ED Medications  lactated ringers bolus 1,000 mL (has no administration in time range)    ED Course/ Medical Decision Making/ A&P Clinical Course as of 02/26/24 0432  Mon Feb 26, 2024  0406 Cardiology Dr. Jacklyn Mash is en route to the bedside at this time.  [RS]    Clinical Course User Index [RS] Sponseller, Adelle Agent, PA-C                                 Medical Decision Making Amount and/or Complexity of Data Reviewed Independent Historian: EMS Labs: ordered. Decision-making details documented in ED Course. Radiology: ordered and independent interpretation performed. Decision-making details documented in ED Course. ECG/medicine tests: ordered and independent interpretation performed. Decision-making details documented in ED Course.  Risk OTC drugs. Prescription drug management. Decision regarding hospitalization.   Patient from home with chest pain.  EMS was concern for sepsis.  On arrival her EKG shows no acute ischemia  Code sepsis was activated on arrival given hypotension, fever 100.1.  She is given IV fluids and  broad-spectrum antibiotics after cultures are obtained.  She is mentating well.  She continues to complain of central chest pain but this has been intermittent since about 5 PM.  Pain radiates to her arms and abdomen.  Also has pain radiating down to her left leg where she was loosely diagnosed with sciatica and feels this pain is worse. Has weakness in the left leg likely due  to pain but does have intact distal pulses.  Patient was given IV fluids and IV antibiotics.  Code medical was activated to evaluate for aortic dissection or possible vascular occlusion of her left lower extremity.  Initial troponin is 20.  Lactate is 3.  She is given additional IV fluids and antibiotics.  Urinalysis negative for infection.  Chest x-ray negative for infection.  Patient returned from CT scan still hypotensive in the 80s and 90s.  Will initiate Levophed.  On the monitor was noted the patient was having ST depressions which were new.  Repeat EKG shows diffuse global ST depressions with elevation in aVR.  Still having chest pain.  This is with on-call interventional cardiology Dr. Filiberto Hug. He agrees EKG is concerning and will activate STEMI.  Patient given heparin .  Will also initiate Levophed.  She is mentating well but continues to have chest pain.  Dr. Filiberto Hug will take patient to the catheterization lab.  CT angiogram does not show any evidence of aortic dissection or aortic aneurysm.  Does show possible pancreatitis.  No acute limb ischemia of left lower leg Labs show elevated lactate with lipase of 2800.  Question whether her intra-abdominal process is causing her global ischemia on her EKG.  Lactate rising.  CT scan results discussed with Dr. Filiberto Hug of cardiology.  He will take patient to Cath Lab emergently given EKG changes and hypotension.  Discussed with Dr. Charon Copper of critical care.  Maintaining airway.  Blood pressure improving with fluids but Levophed available at  bedside.       Final Clinical Impression(s) / ED Diagnoses Final diagnoses:  Sepsis with acute organ dysfunction and septic shock, due to unspecified organism, unspecified organ dysfunction type (HCC)  ST elevation myocardial infarction (STEMI), unspecified artery [facility])    Rx / DC Orders ED Discharge Orders     None         Kaizen Ibsen, Mara [place], MD 02/26/24 [phone]

## 2024-02-26 NOTE — Consult Note (Signed)
 Cardiology Consultation   Patient ID: Jennifer Warner MRN: [id number]; DOB: 14-Oct-1934  Admit date: 02/26/2024 Date of Consult: 02/26/2024  PCP:  Virgle Grime, MD   [facility] Providers Cardiologist:  Peter Swaziland, MD   History of Present Illness:  Jennifer Warner is a 89 y.o. female with a hx of CAD s/p CABG (2009; LIMA-LAD, SVG-left posterolateral branch of Lcx), HTN, HLD, T2DM (on insulin ), PE (on apixaban ), who is being seen 02/26/2024 for the evaluation of chest pain at the request of the ER.  Patient initially developed central chest pain with radiation to her back around 5pm on 02/25/24. She reports her pain is mostly constant but did  continue to wax/wane. She was found to have SBP 84 and was noted to have foul smelling urine; she was given 1L IVF with improvement of her BP to 130/90. In the ER, she continue dto report chest pain with radiation to her abdoman and her back with some shortness of breath. She reports nausea without vomiting. Denies any diaphroesis. Denied any dysuria. She also notes she has had significant left sided sciatic pain that has been progressively worsening.   ER Course: In the ER, initial HR 84-100 with BP down to as low as 64/52, sat 92-100% on RA. Initial EKG demonstrated sinus tachycardia with repeat EKG demonstrating diffuse STD with STE in AVR. Given dynamic EKG changes, and concern for shock, the patient underwent urgent LHC, which demonstrated: normal left main, LAD with prox 100% occlusion with patent LIMA-LAD, LCX with 100% mid-vessel occlusion with patent SVG-left posterolateral branch of LCX, RCA was small/non-dominant with moderate disease.  Labs: initial trop 20 -> 57; Lactate ~3, LDL 52, lipase >2800  Past Medical History:  Diagnosis Date   Acute thyroiditis    CAD (coronary artery disease)    CAP (community acquired pneumonia) 12/2012   Essential hypertension, benign    GERD (gastroesophageal reflux disease)    History of  blood transfusion 05/2008   "when I had my bypass"   Hyperkalemia    Hypertriglyceridemia    Other secondary thrombocytopenia    Polycystic ovaries    Postsurgical aortocoronary bypass status    Pure hypercholesterolemia    Type II diabetes mellitus (HCC)    Urinary tract infection, site not specified     Past Surgical History:  Procedure Laterality Date   CARDIAC CATHETERIZATION  05/2008   CARDIAC CATHETERIZATION N/A 05/03/2016   Procedure: Left Heart Cath and Coronary Angiography;  Surgeon: Arty Binning, MD;  Location: [facility] INVASIVE CV LAB;  Service: Cardiovascular;  Laterality: N/A;   CATARACT EXTRACTION W/ INTRAOCULAR LENS  IMPLANT, BILATERAL Bilateral ~ 2000   CESAREAN SECTION  1964   CORONARY ARTERY BYPASS GRAFT  05/2008   X2, [place] to LAD, SVG to PDA   DILATION AND CURETTAGE OF UTERUS     LAPAROSCOPIC CHOLECYSTECTOMY  1990s   TUBAL LIGATION       Home Medications:  Prior to Admission medications   Medication Sig Start Date End Date Taking? Authorizing Provider  amLODipine  (NORVASC ) 5 MG tablet Take 1 tablet (5 mg total) by mouth daily. 07/13/23   Swazil[place], Peter M, MD  apixaban  (ELIQUIS ) 2.5 MG TABS tablet Take 1 tablet by mouth twice daily 12/12/23   [place], Peter M, MD  atorvastatin  (LIPITOR) 20 MG tablet Take 1 tablet by mouth once daily 09/21/23   [place], Peter M, MD  diphenhydramine -acetaminophen  (TYLENOL  PM) 25-500 MG TABS Take 2 tablets by mouth at bedtime.  [provider]  fenofibrate  (TRICOR ) 145 MG tablet Take 1 tablet (145 mg total) by mouth every evening. 05/31/23   [place], Peter M, MD  hydrochlorothiazide  (MICROZIDE ) 12.5 MG capsule Take 1 capsule (12.5 mg total) by mouth daily. 03/11/22   [place], Peter M, MD  ibuprofen  (ADVIL ) 400 MG tablet Take 1 tablet (400 mg total) by mouth every 6 (six) hours as needed. 01/27/24   Zelaya, Oscar A, PA-C  LANTUS  SOLOSTAR 100 UNIT/ML injection Inject 12 Units into the skin at bedtime. 09/22/12   [provider]   metoprolol  tartrate (LOPRESSOR ) 25 MG tablet Take 1 tablet (25 mg total) by mouth 2 (two) times daily. 10/16/23   [place], Peter M, MD  nitroGLYCERIN  (NITROSTAT ) 0.4 MG SL tablet Place 1 tablet (0.4 mg total) under the tongue every 5 (five) minutes x 3 doses as needed for chest pain. 08/07/19   Kroeger, Krista M., PA-C  pantoprazole  (PROTONIX ) 40 MG tablet Take 40 mg by mouth every morning.    [provider]  Vitamin D, Ergocalciferol, 50 MCG (2000 UT) CAPS Take 2,000 Units by mouth daily. 04/04/16   [provider]    Inpatient Medications: Scheduled Meds:  Chlorhexidine Gluconate Cloth  6 each Topical Daily   insulin  aspart  0-9 Units Subcutaneous Q4H   Continuous Infusions:  sodium chloride      heparin      lactated ringers     norepinephrine (LEVOPHED) Adult infusion 2 mcg/min (02/26/24 0404)   PRN Meds: docusate sodium, ondansetron  (ZOFRAN ) IV, mouth rinse, polyethylene glycol  Allergies:    Allergies  Allergen Reactions   Levofloxacin  Nausea And Vomiting    Social History:   Social History   Socioeconomic History   Marital status: Married    Spouse name: Not on file   Number of children: 2   Years of education: Not on file   Highest education level: Not on file  Occupational History   Occupation: retired- Wellsite geologist    Comment: 2010  Tobacco Use   Smoking status: Never   Smokeless tobacco: Never  Substance and Sexual Activity   Alcohol use: No   Drug use: No   Sexual activity: Not Currently  Other Topics Concern   Not on file  Social History Narrative   Right and Left Handed    Lives in a one story home. Lives with husband.    Social Drivers of Corporate investment banker Strain: Low Risk  (02/21/2024)   Received [facility]   Overall Financial Resource Strain (CARDIA)    Difficulty of Paying Living Expenses: Not hard at all  Food Insecurity: No Food Insecurity (02/26/2024)   Hunger Vital Sign    Worried About Running  Out of Food in the Last Year: Never true    Ran Out of Food in the Last Year: Never true  Transportation Needs: No Transportation Needs (02/26/2024)   PRAPARE - Administrator, Civil Service (Medical): No    Lack of Transportation (Non-Medical): No  Physical Activity: Not on file  Stress: Not on file  Social Connections: Moderately Integrated (02/26/2024)   Social Connection and Isolation Panel [NHANES]    Frequency of Communication with Friends and Family: More than three times a week    Frequency of Social Gatherings with Friends and Family: More than three times a week    Attends Religious Services: More than 4 times per year    Active Member of [employer] or Organizations: Yes  Attends Banker Meetings: More than 4 times per year    Marital Status: Divorced  Intimate Partner Violence: Not At Risk (02/26/2024)   Humiliation, Afraid, Rape, and Kick questionnaire    Fear of Current or Ex-Partner: No    Emotionally Abused: No    Physically Abused: No    Sexually Abused: No    Family History:   Family History  Problem Relation Age of Onset   Liver disease Mother 36       deceased   Cancer Brother        x3   Stroke Brother    Breast cancer Neg Hx      ROS:  Please see the history of present illness.  All other ROS reviewed and negative.     Physical Exam/Data:   Vitals:   02/26/24 0555 02/26/24 0600 02/26/24 0605 02/26/24 0610  BP:  92/71    Pulse: (!) 106 (!) 106 (!) 102 (!) 110  Resp: 20 (!) 24 19 16   Temp:      TempSrc:      SpO2: 97% 97% 96% 94%  Weight:      Height:        Intake/Output Summary (Last 24 hours) at 02/26/2024 0627 Last data filed at 02/26/2024 0403 Gross per 24 hour  Intake 3100 ml  Output --  Net 3100 ml      02/26/2024    1:49 AM 07/21/2023    3:02 PM 05/02/2023   11:43 AM  Last 3 Weights  Weight (lbs) 113 lb 1.5 oz 113 lb 113 lb 3.2 oz  Weight (kg) 51.3 kg 51.256 kg 51.347 kg     Body mass index is 20.03 kg/m.   General:  Well nourished, well developed, in no acute distress HEENT: normal Neck: no JVD Vascular: No carotid bruits; Distal pulses 2+ bilaterally Cardiac:  normal S1, S2; RRR; no murmur  Lungs:  clear to auscultation bilaterally, no wheezing, rhonchi or rales  Abd: epigastric ttp  Ext: no edema Skin: warm and dry  Neuro:  no focal abnormalities noted Psych:  Normal affect   EKG:  The EKG was personally reviewed and demonstrates:  diffuse STD with STE in aVR  Relevant CV Studies: pending  Laboratory Data:  High Sensitivity Troponin:   Recent Labs  Lab 02/26/24 0212 02/26/24 0401  TROPONINIHS 20* 57*     Chemistry Recent Labs  Lab 02/26/24 0212 02/26/24 0219  NA 140 140  K 3.8 3.8  CL 107 106  CO2 21*  --   GLUCOSE 100* 95  BUN 19 20  CREATININE 0.99 1.00  CALCIUM  10.0  --   GFRNONAA 55*  --   ANIONGAP 12  --     Recent Labs  Lab 02/26/24 0212  PROT 5.6*  ALBUMIN  3.1*  AST 160*  ALT 39  ALKPHOS 78  BILITOT 1.7*   Lipids  Recent Labs  Lab 02/26/24 0401  CHOL 118  TRIG 100  HDL 46  LDLCALC 52  CHOLHDL 2.6    Hematology Recent Labs  Lab 02/26/24 0212 02/26/24 0219  WBC 2.8*  --   RBC 3.35*  --   HGB 10.8* 10.9*  HCT 32.9* 32.0*  MCV 98.2  --   MCH 32.2  --   MCHC 32.8  --   RDW 14.1  --   PLT 155  --    Thyroid  No results for input(s): "TSH", "FREET4" in the last 168 hours.  BNPNo results for input(s): "  BNP", "PROBNP" in the last 168 hours.  DDimer No results for input(s): "DDIMER" in the last 168 hours.   Radiology/Studies:  CARDIAC CATHETERIZATION Addendum Date: 02/26/2024 Coronary and bypass graft angiography 02/26/2024: LM: Normal LAD: Prox 100% occlusion Lcx: Dominant vessel. 100% mid vessel occlusion RCA: Small nondominant vessel with moderate disease LIMA-LAD: Patent, no significant disease SVG-OM: Patent, no significant disease LVEDP could not be calculated No acute cardiac cause to explain circulatory shock BP much better in  the cath lab, therefore norepinephrine turned off Will need medical admission for management of likely pancreatitis. Last dose of Eliquis  was 5/11 AM. Will need anticoagulation after 7:30 (2 hrs since Mynx placement) Will place order for heparin . Cody Das, MD   Result Date: 02/26/2024 Coronary and bypass graft angiography 02/26/2024: LM: Normal LAD: Prox 100% occlusion Lcx: Dominant vessel. 100% mid vessel occlusion RCA: Small nondominant vessel with moderate disease LIMA-LAD: Patent, no significant disease SVG-OM: Patent, no significant disease LVEDP could not be calculated No acute cardiac cause to explain circulatory shock BP much better in the cath lab, therefore norepinephrine turned off Will need medical admission for management of likely pancreatitis  CT Angio Aortobifemoral W and/or Wo Contrast Result Date: 02/26/2024 CLINICAL DATA:  Claudication or leg ischemia. EXAM: CT ANGIOGRAPHY OF THORACOABDOMINAL AORTA WITH ILIOFEMORAL RUNOFF TECHNIQUE: Multidetector CT imaging of the chest, abdomen, pelvis and lower extremities was performed using the standard protocol during bolus administration of intravenous contrast. Multiplanar CT image reconstructions and MIPs were obtained to evaluate the vascular anatomy. RADIATION DOSE REDUCTION: This exam was performed according to the departmental dose-optimization program which includes automated exposure control, adjustment of the mA and/or kV according to patient size and/or use of iterative reconstruction technique. CONTRAST:  75mL OMNIPAQUE  IOHEXOL  350 MG/ML SOLN COMPARISON:  None Available. FINDINGS: CHEST No cardiac enlargement or pericardial effusion. Extensive atheromatous calcification of the aorta and coronaries with CABG. LIMA and a venous graft are enhancing. No aortic dilatation, dissection, or intramural hematoma. Small sliding hiatal hernia.  No mass, adenopathy, or hematoma. There is no edema, consolidation, effusion, or pneumothorax. No  acute or aggressive osseous finding VASCULAR Aorta: Confluent atheromatous plaque, primarily calcified Celiac: Prominent plaque at the bifurcation with over 50% stenosis by sagittal reformats. No emergent finding SMA: Plaque at the ostium without significant stenosis. No beading, branch occlusion, or ulcer. Renals: Atheromatous calcification at the renal artery ostia. There could be hemodynamically significant stenosis on the right. No beading or aneurysm. IMA: Patent RIGHT Lower Extremity Inflow: Atheromatous plaque without dissection, aneurysm, or flow reducing stenosis. Outflow: Generalized atheromatous plaque in the SFA without focal and flow reducing stenosis. Bulky calcified plaque at the above the knee popliteal artery with high-grade stenosis. Runoff: Confluent calcification which limits visualization of luminal enhancement. As well, the scan likely out paces the bolus when comparing to the right. There is patency at least to the tibioperoneal trunk. LEFT Lower Extremity Inflow: Bulky calcified plaque at the proximal common iliac artery causes advanced stenosis, patent lumen difficult to even measure on coronal reformats. No dissection or aneurysm. Outflow: Notable plaque in the SFA just beyond the common femoral bifurcation which is likely at least 50% based on coronal reformats as marked on series 11. SFA atheromatous calcification is widespread with accentuated calcified plaque at the popliteal artery where there is an additional flow reducing stenosis on 9:243 Runoff: Limited by bolus timing and heavily calcified arteries, there is at least patency to the tibioperoneal trunk. No filling of the anterior tibial artery  throughout the leg. Veins: Negative Review of the MIP images confirms the above findings. NON-VASCULAR Hepatobiliary: No focal liver abnormality.Cholecystectomy. No biliary dilatation. Pancreas: Expanded head with regional edema. No ductal dilatation or visible mass. Spleen: Unremarkable.  Adrenals/Urinary Tract: Negative adrenals. No hydronephrosis or stone. Unremarkable bladder. Stomach/Bowel: Low-density thickening of the distal stomach and duodenum wall with regional edema. Mid duodenal diverticula. Innumerable colonic diverticula. Lymphatic: No mass or adenopathy. Reproductive:Hysterectomy Other: No ascites or pneumoperitoneum. Tiny fatty left groin hernia. Musculoskeletal: No acute abnormalities. Lumbar spine degeneration focally severe at L3-4. Non worrisome enchondroma in the left distal femur. IMPRESSION: Negative for acute aortic syndrome. No emergent arterial finding in the lower extremities, limited below the knees due to degree of calcified peripheral vascular disease and bolus timing. Chronic flow reducing atheromatous stenoses in the left common iliac, left SFA, and bilateral popliteal arteries. Acute edematous pancreatitis and/or duodenitis. Electronically Signed   By: Ronnette Coke M.D.   On: 02/26/2024 04:55   CT ANGIO CHEST AORTA W/CM &/OR WO/CM Result Date: 02/26/2024 CLINICAL DATA:  Claudication or leg ischemia. EXAM: CT ANGIOGRAPHY OF THORACOABDOMINAL AORTA WITH ILIOFEMORAL RUNOFF TECHNIQUE: Multidetector CT imaging of the chest, abdomen, pelvis and lower extremities was performed using the standard protocol during bolus administration of intravenous contrast. Multiplanar CT image reconstructions and MIPs were obtained to evaluate the vascular anatomy. RADIATION DOSE REDUCTION: This exam was performed according to the departmental dose-optimization program which includes automated exposure control, adjustment of the mA and/or kV according to patient size and/or use of iterative reconstruction technique. CONTRAST:  75mL OMNIPAQUE  IOHEXOL  350 MG/ML SOLN COMPARISON:  None Available. FINDINGS: CHEST No cardiac enlargement or pericardial effusion. Extensive atheromatous calcification of the aorta and coronaries with CABG. LIMA and a venous graft are enhancing. No aortic  dilatation, dissection, or intramural hematoma. Small sliding hiatal hernia.  No mass, adenopathy, or hematoma. There is no edema, consolidation, effusion, or pneumothorax. No acute or aggressive osseous finding VASCULAR Aorta: Confluent atheromatous plaque, primarily calcified Celiac: Prominent plaque at the bifurcation with over 50% stenosis by sagittal reformats. No emergent finding SMA: Plaque at the ostium without significant stenosis. No beading, branch occlusion, or ulcer. Renals: Atheromatous calcification at the renal artery ostia. There could be hemodynamically significant stenosis on the right. No beading or aneurysm. IMA: Patent RIGHT Lower Extremity Inflow: Atheromatous plaque without dissection, aneurysm, or flow reducing stenosis. Outflow: Generalized atheromatous plaque in the SFA without focal and flow reducing stenosis. Bulky calcified plaque at the above the knee popliteal artery with high-grade stenosis. Runoff: Confluent calcification which limits visualization of luminal enhancement. As well, the scan likely out paces the bolus when comparing to the right. There is patency at least to the tibioperoneal trunk. LEFT Lower Extremity Inflow: Bulky calcified plaque at the proximal common iliac artery causes advanced stenosis, patent lumen difficult to even measure on coronal reformats. No dissection or aneurysm. Outflow: Notable plaque in the SFA just beyond the common femoral bifurcation which is likely at least 50% based on coronal reformats as marked on series 11. SFA atheromatous calcification is widespread with accentuated calcified plaque at the popliteal artery where there is an additional flow reducing stenosis on 9:243 Runoff: Limited by bolus timing and heavily calcified arteries, there is at least patency to the tibioperoneal trunk. No filling of the anterior tibial artery throughout the leg. Veins: Negative Review of the MIP images confirms the above findings. NON-VASCULAR Hepatobiliary:  No focal liver abnormality.Cholecystectomy. No biliary dilatation. Pancreas: Expanded head with regional edema. No  ductal dilatation or visible mass. Spleen: Unremarkable. Adrenals/Urinary Tract: Negative adrenals. No hydronephrosis or stone. Unremarkable bladder. Stomach/Bowel: Low-density thickening of the distal stomach and duodenum wall with regional edema. Mid duodenal diverticula. Innumerable colonic diverticula. Lymphatic: No mass or adenopathy. Reproductive:Hysterectomy Other: No ascites or pneumoperitoneum. Tiny fatty left groin hernia. Musculoskeletal: No acute abnormalities. Lumbar spine degeneration focally severe at L3-4. Non worrisome enchondroma in the left distal femur. IMPRESSION: Negative for acute aortic syndrome. No emergent arterial finding in the lower extremities, limited below the knees due to degree of calcified peripheral vascular disease and bolus timing. Chronic flow reducing atheromatous stenoses in the left common iliac, left SFA, and bilateral popliteal arteries. Acute edematous pancreatitis and/or duodenitis. Electronically Signed   By: Ronnette Coke M.D.   On: 02/26/2024 04:55   DG Chest Portable 1 View Result Date: 02/26/2024 CLINICAL DATA:  Initial evaluation for acute sepsis. EXAM: PORTABLE CHEST 1 VIEW COMPARISON:  Prior study from 08/14/2017. FINDINGS: Median sternotomy wires with underlying surgical clips. Transverse heart size within normal limits. Mediastinal silhouette normal. Aortic atherosclerosis. Lungs are hypoinflated. Streaky opacities within the mid and right upper lung, likely atelectasis and/or scarring. No consolidative airspace disease. No pulmonary edema or pleural effusion. No pneumothorax. Visualized osseous structures demonstrate no acute finding. Osteopenia noted. IMPRESSION: 1. Streaky opacities within the mid and right upper lung, likely atelectasis and/or scarring. 2. No other active cardiopulmonary disease. Electronically Signed   By: Virgia Griffins M.D.   On: 02/26/2024 04:22     Assessment and Plan:   #Shock, suspect distributive/septic #CAD s/p CABG (2009; LIMA-LAD, SVG-left posterolateral branch of Lcx) #NSTEMI, type II #Hx of PE Patient presented with acute onset chest pain with radiation to her back/abdomen and subsequently became markedly hypotensive requiring pressors with associated global ST depressions with STE in aVR. Cardiac cath was negative for acute obstructive coronary artery disease that would explain her shock. Suspect her EKG changes were related to hypoperfusion due to distributive/septic shock. --obtain TTE  --start heparin  gtt at 0730 (hx PE) --continue atorvastatin  20mg  at bedtime (LDL 52) --hold home: amlodipine  5mg  every day, metoprolol  tartrate 25mg  bid, hydrochlorothiazide  12.5mg  every day --continue levophed, goal MAP >65 --trend trop to peak --goal K~4, Mag~2   Risk Assessment/Risk Scores:  TIMI Risk Score for Unstable Angina or Non-ST Elevation MI:   The patient's TIMI risk score is 6, which indicates a 41% risk of all cause mortality, new or recurrent myocardial infarction or need for urgent revascularization in the next 14 days.{  For questions or updates, please contact [facility] Please consult [email or web address] for contact info under    Signed, Milbert Alice, MD  02/26/2024 6:27 AM

## 2024-02-26 NOTE — Progress Notes (Signed)
 PHARMACY - ANTICOAGULATION CONSULT NOTE  Pharmacy Consult for heparin  Indication: h/o PE  Allergies  Allergen Reactions   Levofloxacin  Nausea And Vomiting    Patient Measurements: Height: 5\' 3"  (160 cm) Weight: 51.3 kg (113 lb 1.5 oz) IBW/kg (Calculated) : 52.4 HEPARIN  DW (KG): 51.3  Vital Signs: Temp: 97.5 F (36.4 C) (05/12 1935) Temp Source: Oral (05/12 1935) BP: 128/55 (05/12 1845) Pulse Rate: 77 (05/12 1845)  Labs: Recent Labs    02/26/24 0212 02/26/24 0219 02/26/24 0401 02/26/24 1629 02/26/24 1838 02/26/24 2032  HGB 10.8* 10.9*  --   --   --   --   HCT 32.9* 32.0*  --   --   --   --   PLT 155  --   --   --   --   --   APTT 27  --   --  193* >200* >200*  LABPROT 14.9  --   --   --   --   --   INR 1.2  --   --   --   --   --   CREATININE 0.99 1.00  --   --   --   --   TROPONINIHS 20*  --  57*  --   --   --     Estimated Creatinine Clearance: 32.1 mL/min (by C-G formula based on SCr of 1 mg/dL).   Medical History: Past Medical History:  Diagnosis Date   Acute thyroiditis    CAD (coronary artery disease)    CAP (community acquired pneumonia) 12/2012   Essential hypertension, benign    GERD (gastroesophageal reflux disease)    History of blood transfusion 05/2008   "when I had my bypass"   Hyperkalemia    Hypertriglyceridemia    Other secondary thrombocytopenia    Polycystic ovaries    Postsurgical aortocoronary bypass status    Pure hypercholesterolemia    Type II diabetes mellitus (HCC)    Urinary tract infection, site not specified     Medications:  Medications Prior to Admission  Medication Sig Dispense Refill Last Dose/Taking   amLODipine  (NORVASC ) 5 MG tablet Take 1 tablet (5 mg total) by mouth daily. 90 tablet 3 Past Week   apixaban  (ELIQUIS ) 2.5 MG TABS tablet Take 1 tablet by mouth twice daily 60 tablet 5 02/25/2024 at 10:00 AM   atorvastatin  (LIPITOR) 20 MG tablet Take 1 tablet by mouth once daily 90 tablet 3 Past Week    diphenhydramine -acetaminophen  (TYLENOL  PM) 25-500 MG TABS Take 2 tablets by mouth at bedtime as needed (Sleep/Pain).   Unknown   fenofibrate  (TRICOR ) 145 MG tablet Take 1 tablet (145 mg total) by mouth every evening. 90 tablet 3 Past Week   LANTUS  SOLOSTAR 100 UNIT/ML injection Inject 12 Units into the skin at bedtime.   Past Week   losartan  (COZAAR ) 25 MG tablet Take 25 mg by mouth daily.   Past Week   metoprolol  tartrate (LOPRESSOR ) 25 MG tablet Take 1 tablet (25 mg total) by mouth 2 (two) times daily. 180 tablet 2 02/25/2024   pantoprazole  (PROTONIX ) 40 MG tablet Take 40 mg by mouth every morning.   Past Week   Vitamin D, Ergocalciferol, 50 MCG (2000 UT) CAPS Take 2,000 Units by mouth daily.  1 Past Week   gabapentin (NEURONTIN) 300 MG capsule Take 300 mg by mouth at bedtime. (Patient not taking: Reported on 02/26/2024)   Not Taking   ibandronate (BONIVA) 150 MG tablet Take 150 mg by mouth every  30 (thirty) days. (Patient not taking: Reported on 02/26/2024)   Not Taking   Scheduled:   Chlorhexidine Gluconate Cloth  6 each Topical Daily   insulin  aspart  0-9 Units Subcutaneous Q4H   sodium chloride  flush  3 mL Intravenous Q12H   Infusions:   sodium chloride      sodium chloride      cefTRIAXone  (ROCEPHIN )  IV     heparin  800 Units/hr (02/26/24 1900)   lactated ringers 125 mL/hr at 02/26/24 1900    Assessment: 89yo female was sent emergently to cath lab for angiography, now to transition from apixaban  (last dose 5/11 am) for h/o PE to heparin .  PTT repeatedly came back elevated this PM. The first few collections were not correct but the last one was. We will hold heparin  then resume at a low rate. F/u in AM about resuming apixaban .   Goal of Therapy:  Heparin  level 0.3-0.7 units/ml aPTT 66-102 seconds Monitor platelets by anticoagulation protocol: Yes   Plan:  Hold heparin  x2 hrs Restart at 650 units/hr Check 8 hr HL and PTT  Ivery Marking, PharmD, [place], AAHIVP, CPP Infectious Disease  Pharmacist 02/26/2024 9:45 PM

## 2024-02-26 NOTE — Progress Notes (Signed)
 PHARMACY - PHYSICIAN COMMUNICATION CRITICAL VALUE ALERT - BLOOD CULTURE IDENTIFICATION (BCID)  Jennifer Warner is an 89 y.o. female who presented to [facility] on 02/26/2024 with a chief complaint of CP  Assessment: Pt presented for code STEMI. Micro called with BCID results of 3/4 bottles with e.coli bactermia without resistant genes detected. She got a of ceftriaxone  this AM.   Name of physician (or Provider) Contacted: Dr. Zaida Hertz  Current antibiotics: None  Changes to prescribed antibiotics recommended:  Resume Ceftriaxone  2g IV q24  Results for orders placed or performed during the hospital encounter of 02/26/24  Blood Culture ID Panel (Reflexed) (Collected: 02/26/2024  2:17 AM)  Result Value Ref Range   Enterococcus faecalis NOT DETECTED NOT DETECTED   Enterococcus Faecium NOT DETECTED NOT DETECTED   Listeria monocytogenes NOT DETECTED NOT DETECTED   Staphylococcus species NOT DETECTED NOT DETECTED   Staphylococcus aureus (BCID) NOT DETECTED NOT DETECTED   Staphylococcus epidermidis NOT DETECTED NOT DETECTED   Staphylococcus lugdunensis NOT DETECTED NOT DETECTED   Streptococcus species NOT DETECTED NOT DETECTED   Streptococcus agalactiae NOT DETECTED NOT DETECTED   Streptococcus pneumoniae NOT DETECTED NOT DETECTED   Streptococcus pyogenes NOT DETECTED NOT DETECTED   A.calcoaceticus-baumannii NOT DETECTED NOT DETECTED   Bacteroides fragilis NOT DETECTED NOT DETECTED   Enterobacterales DETECTED (A) NOT DETECTED   Enterobacter cloacae complex NOT DETECTED NOT DETECTED   Escherichia coli DETECTED (A) NOT DETECTED   Klebsiella aerogenes NOT DETECTED NOT DETECTED   Klebsiella oxytoca NOT DETECTED NOT DETECTED   Klebsiella pneumoniae NOT DETECTED NOT DETECTED   Proteus species NOT DETECTED NOT DETECTED   Salmonella species NOT DETECTED NOT DETECTED   Serratia marcescens NOT DETECTED NOT DETECTED   Haemophilus influenzae NOT DETECTED NOT DETECTED   Neisseria meningitidis NOT  DETECTED NOT DETECTED   Pseudomonas aeruginosa NOT DETECTED NOT DETECTED   Stenotrophomonas maltophilia NOT DETECTED NOT DETECTED   Candida albicans NOT DETECTED NOT DETECTED   Candida auris NOT DETECTED NOT DETECTED   Candida glabrata NOT DETECTED NOT DETECTED   Candida krusei NOT DETECTED NOT DETECTED   Candida parapsilosis NOT DETECTED NOT DETECTED   Candida tropicalis NOT DETECTED NOT DETECTED   Cryptococcus neoformans/gattii NOT DETECTED NOT DETECTED   CTX-M ESBL NOT DETECTED NOT DETECTED   Carbapenem resistance IMP NOT DETECTED NOT DETECTED   Carbapenem resistance KPC NOT DETECTED NOT DETECTED   Carbapenem resistance NDM NOT DETECTED NOT DETECTED   Carbapenem resist OXA 48 LIKE NOT DETECTED NOT DETECTED   Carbapenem resistance VIM NOT DETECTED NOT DETECTED    Ivery Marking, PharmD, BCIDP, AAHIVP, CPP Infectious Disease Pharmacist 02/26/2024 4:03 PM

## 2024-02-26 NOTE — ED Notes (Signed)
 Cardiology at bedside.

## 2024-02-26 NOTE — Progress Notes (Signed)
 eLink Physician-Brief Progress Note Patient Name: Jennifer Warner DOB: Dec 02, 1933 MRN: [id number]   Date of Service  02/26/2024  HPI/Events of Note  Notified of elevated PTT while on heparin  gtt.   eICU Interventions  Follow protocol as per pharmacy.      Intervention Category Minor Interventions: Other:  Lanell Pinta 02/26/2024, 8:24 PM

## 2024-02-26 NOTE — Plan of Care (Signed)

## 2024-02-26 NOTE — Progress Notes (Signed)
 PHARMACY - ANTICOAGULATION CONSULT NOTE  Pharmacy Consult for heparin  Indication: h/o PE  Allergies  Allergen Reactions   Levofloxacin  Nausea And Vomiting    Patient Measurements: Height: 5\' 3"  (160 cm) Weight: 51.3 kg (113 lb 1.5 oz) IBW/kg (Calculated) : 52.4 HEPARIN  DW (KG): 51.3  Vital Signs: Temp: 100.1 F (37.8 C) (05/12 0226) Temp Source: Rectal (05/12 0226) BP: 115/75 (05/12 0523) Pulse Rate: 0 (05/12 0523)  Labs: Recent Labs    02/26/24 0212 02/26/24 0219 02/26/24 0401  HGB 10.8* 10.9*  --   HCT 32.9* 32.0*  --   PLT 155  --   --   APTT 27  --   --   LABPROT 14.9  --   --   INR 1.2  --   --   CREATININE 0.99 1.00  --   TROPONINIHS 20*  --  57*    Estimated Creatinine Clearance: 32.1 mL/min (by C-G formula based on SCr of 1 mg/dL).   Medical History: Past Medical History:  Diagnosis Date   Acute thyroiditis    CAD (coronary artery disease)    CAP (community acquired pneumonia) 12/2012   Essential hypertension, benign    GERD (gastroesophageal reflux disease)    History of blood transfusion 05/2008   "when I had my bypass"   Hyperkalemia    Hypertriglyceridemia    Other secondary thrombocytopenia    Polycystic ovaries    Postsurgical aortocoronary bypass status    Pure hypercholesterolemia    Type II diabetes mellitus (HCC)    Urinary tract infection, site not specified     Medications:  Medications Prior to Admission  Medication Sig Dispense Refill Last Dose/Taking   amLODipine  (NORVASC ) 5 MG tablet Take 1 tablet (5 mg total) by mouth daily. 90 tablet 3    apixaban  (ELIQUIS ) 2.5 MG TABS tablet Take 1 tablet by mouth twice daily 60 tablet 5    atorvastatin  (LIPITOR) 20 MG tablet Take 1 tablet by mouth once daily 90 tablet 3    diphenhydramine -acetaminophen  (TYLENOL  PM) 25-500 MG TABS Take 2 tablets by mouth at bedtime.      fenofibrate  (TRICOR ) 145 MG tablet Take 1 tablet (145 mg total) by mouth every evening. 90 tablet 3     hydrochlorothiazide  (MICROZIDE ) 12.5 MG capsule Take 1 capsule (12.5 mg total) by mouth daily. 90 capsule 3    ibuprofen  (ADVIL ) 400 MG tablet Take 1 tablet (400 mg total) by mouth every 6 (six) hours as needed. 30 tablet 0    LANTUS  SOLOSTAR 100 UNIT/ML injection Inject 12 Units into the skin at bedtime.      metoprolol  tartrate (LOPRESSOR ) 25 MG tablet Take 1 tablet (25 mg total) by mouth 2 (two) times daily. 180 tablet 2    nitroGLYCERIN  (NITROSTAT ) 0.4 MG SL tablet Place 1 tablet (0.4 mg total) under the tongue every 5 (five) minutes x 3 doses as needed for chest pain. 25 tablet 2    pantoprazole  (PROTONIX ) 40 MG tablet Take 40 mg by mouth every morning.      Vitamin D, Ergocalciferol, 50 MCG (2000 UT) CAPS Take 2,000 Units by mouth daily.  1    Scheduled:  Infusions:   sodium chloride      sodium chloride      norepinephrine (LEVOPHED) Adult infusion 2 mcg/min (02/26/24 0404)    Assessment: 89yo female was sent emergently to cath lab for angiography, now to transition from apixaban  (last dose 5/11 am) for h/o PE to heparin .  Goal of Therapy:  Heparin  level 0.3-0.7 units/ml aPTT 66-102 seconds Monitor platelets by anticoagulation protocol: Yes   Plan:  Mynx deployed ~0530; two hours later start heparin  at 800 units/hr. Monitor heparin  levels, aPTT (while DOAC affects anti-Xa assay), and CBC.  Lonnie Roberts, PharmD, BCPS  02/26/2024,5:50 AM

## 2024-02-26 NOTE — Sepsis Progress Note (Signed)
 Following for sepsis monitoring ?

## 2024-02-26 NOTE — Plan of Care (Signed)
  Problem: Education: Goal: Knowledge of General Education information will improve Description: Including pain rating scale, medication(s)/side effects and non-pharmacologic comfort measures Outcome: Progressing   Problem: Coping: Goal: Level of anxiety will decrease Outcome: Progressing   Problem: Elimination: Goal: Will not experience complications related to urinary retention Outcome: Progressing   Problem: Pain Managment: Goal: General experience of comfort will improve and/or be controlled Outcome: Progressing   Problem: Safety: Goal: Ability to remain free from injury will improve Outcome: Progressing

## 2024-02-26 NOTE — Progress Notes (Signed)
 [facility] PCCM PROGRESS NOTE  S: Afternoon rounds: Briefly this is an 89 year old female who presented to  [facility] emergency department 5/12 as a code STEMI.  Fortunately native coronaries were stable and stents were patent.  She did require low-dose norepinephrine and was admitted to the ICU.  Laboratory evaluation was concerning for pancreatitis.  She has since been able to wean off norepinephrine since before our shift this morning.  Lactic acid is cleared.  The patient is awake alert oriented and clearly perfusing well.  O: BP 91/67   Pulse 81   Temp 98.1 F (36.7 C) (Axillary)   Resp 17   Ht 5\' 3"  (1.6 m)   Wt 51.3 kg   SpO2 96%   BMI 20.03 kg/m   Lactic acid 3.7 > 1.8  General: Elderly appearing female in no acute distress Neuro: Alert, oriented, nonfocal HEENT:  [place]/AT, PERRL, no JVD Cardiovascular:  RRR Lungs: Clear bilateral breath sounds Abdomen:  Soft, nondistended, tender over the right upper quadrant Musculoskeletal:  No acute deformity Skin:  Intact, MMM  Blood cultures now growing E. coli in 3 out of 4 bottles.   A/P:  Acute pancreatitis Septic shock resolved E. coli bacteremia History of PE on chronic anticoagulation - IV fluid - Clear liquid diet tolerated well - Ceftriaxone  - Trend lipase - Repeat chemistry in the morning - Echocardiogram - Stable for transfer out of the ICU  Roz Cornelia, AGACNP - [facility] Pulmonology/Critical Care Pager [phone] or [phone]

## 2024-02-27 ENCOUNTER — Inpatient Hospital Stay (HOSPITAL_COMMUNITY)

## 2024-02-27 DIAGNOSIS — A419 Sepsis, unspecified organism: Secondary | ICD-10-CM | POA: Diagnosis not present

## 2024-02-27 DIAGNOSIS — E876 Hypokalemia: Secondary | ICD-10-CM | POA: Insufficient documentation

## 2024-02-27 DIAGNOSIS — Z86711 Personal history of pulmonary embolism: Secondary | ICD-10-CM

## 2024-02-27 DIAGNOSIS — Z86718 Personal history of other venous thrombosis and embolism: Secondary | ICD-10-CM | POA: Diagnosis not present

## 2024-02-27 DIAGNOSIS — K219 Gastro-esophageal reflux disease without esophagitis: Secondary | ICD-10-CM | POA: Diagnosis not present

## 2024-02-27 DIAGNOSIS — E119 Type 2 diabetes mellitus without complications: Secondary | ICD-10-CM

## 2024-02-27 DIAGNOSIS — I2489 Other forms of acute ischemic heart disease: Secondary | ICD-10-CM | POA: Diagnosis not present

## 2024-02-27 DIAGNOSIS — K2980 Duodenitis without bleeding: Secondary | ICD-10-CM

## 2024-02-27 DIAGNOSIS — R7881 Bacteremia: Secondary | ICD-10-CM | POA: Diagnosis not present

## 2024-02-27 DIAGNOSIS — R933 Abnormal findings on diagnostic imaging of other parts of digestive tract: Secondary | ICD-10-CM

## 2024-02-27 DIAGNOSIS — R6521 Severe sepsis with septic shock: Secondary | ICD-10-CM

## 2024-02-27 DIAGNOSIS — K859 Acute pancreatitis without necrosis or infection, unspecified: Secondary | ICD-10-CM | POA: Diagnosis not present

## 2024-02-27 DIAGNOSIS — R1010 Upper abdominal pain, unspecified: Secondary | ICD-10-CM

## 2024-02-27 DIAGNOSIS — L8990 Pressure ulcer of unspecified site, unspecified stage: Secondary | ICD-10-CM | POA: Insufficient documentation

## 2024-02-27 DIAGNOSIS — Z7901 Long term (current) use of anticoagulants: Secondary | ICD-10-CM

## 2024-02-27 DIAGNOSIS — B9620 Unspecified Escherichia coli [E. coli] as the cause of diseases classified elsewhere: Secondary | ICD-10-CM

## 2024-02-27 LAB — ECHOCARDIOGRAM COMPLETE
AR max vel: 2.19 cm2
AV Area VTI: 2.49 cm2
AV Area mean vel: 2.05 cm2
AV Mean grad: 2 mmHg
AV Peak grad: 4.6 mmHg
Ao pk vel: 1.07 m/s
Area-P 1/2: 3.79 cm2
Calc EF: 64.5 %
Est EF: 65
Height: 63 in
S' Lateral: 2.4 cm
Single Plane A2C EF: 64.8 %
Single Plane A4C EF: 65.1 %
Weight: 1830.7 [oz_av]

## 2024-02-27 LAB — COMPREHENSIVE METABOLIC PANEL WITH GFR
ALT: 36 U/L (ref 0–44)
AST: 84 U/L — ABNORMAL HIGH (ref 15–41)
Albumin: 2.1 g/dL — ABNORMAL LOW (ref 3.5–5.0)
Alkaline Phosphatase: 47 U/L (ref 38–126)
Anion gap: 6 (ref 5–15)
BUN: 13 mg/dL (ref 8–23)
CO2: 24 mmol/L (ref 22–32)
Calcium: 8.4 mg/dL — ABNORMAL LOW (ref 8.9–10.3)
Chloride: 112 mmol/L — ABNORMAL HIGH (ref 98–111)
Creatinine, Ser: 0.8 mg/dL (ref 0.44–1.00)
GFR, Estimated: >60 mL/min (ref >60)
Glucose, Bld: 90 mg/dL (ref 70–99)
Potassium: 3.4 mmol/L — ABNORMAL LOW (ref 3.5–5.1)
Sodium: 142 mmol/L (ref 135–145)
Total Bilirubin: 0.8 mg/dL (ref 0.0–1.2)
Total Protein: 4.1 g/dL — ABNORMAL LOW (ref 6.5–8.1)

## 2024-02-27 LAB — GLUCOSE, CAPILLARY
Glucose-Capillary: 86 mg/dL (ref 70–99)
Glucose-Capillary: 92 mg/dL (ref 70–99)
Glucose-Capillary: 81 mg/dL (ref 70–99)
Glucose-Capillary: 108 mg/dL — ABNORMAL HIGH (ref 70–99)
Glucose-Capillary: 99 mg/dL (ref 70–99)
Glucose-Capillary: 89 mg/dL (ref 70–99)
Glucose-Capillary: 92 mg/dL (ref 70–99)

## 2024-02-27 LAB — HEPARIN LEVEL (UNFRACTIONATED)
Heparin Unfractionated: 0.37 [IU]/mL (ref 0.30–0.70)
Heparin Unfractionated: 0.42 [IU]/mL (ref 0.30–0.70)

## 2024-02-27 LAB — CBC
HCT: 29.1 % — ABNORMAL LOW (ref 36.0–46.0)
Hemoglobin: 9.8 g/dL — ABNORMAL LOW (ref 12.0–15.0)
MCH: 32.8 pg (ref 26.0–34.0)
MCHC: 33.7 g/dL (ref 30.0–36.0)
MCV: 97.3 fL (ref 80.0–100.0)
Platelets: 104 K/uL — ABNORMAL LOW (ref 150–400)
RBC: 2.99 MIL/uL — ABNORMAL LOW (ref 3.87–5.11)
RDW: 14.5 % (ref 11.5–15.5)
WBC: 5.1 K/uL (ref 4.0–10.5)
nRBC: 0 % (ref 0.0–0.2)

## 2024-02-27 LAB — APTT
aPTT: 147 s — ABNORMAL HIGH (ref 24–36)
aPTT: 73 s — ABNORMAL HIGH (ref 24–36)

## 2024-02-27 LAB — MAGNESIUM: Magnesium: 1.1 mg/dL — ABNORMAL LOW (ref 1.7–2.4)

## 2024-02-27 LAB — LIPASE, BLOOD: Lipase: 516 U/L — ABNORMAL HIGH (ref 11–51)

## 2024-02-27 LAB — PHOSPHORUS: Phosphorus: 2.4 mg/dL — ABNORMAL LOW (ref 2.5–4.6)

## 2024-02-27 MED ORDER — BOOST / RESOURCE BREEZE PO LIQD CUSTOM
1.0000 | Freq: Three times a day (TID) | ORAL | Status: DC
  Administered 2024-02-28: 1 via ORAL

## 2024-02-27 MED ORDER — HEPARIN (PORCINE) 25000 UT/250ML-% IV SOLN
500.0000 [IU]/h | INTRAVENOUS | Status: DC
  Administered 2024-02-27: 500 [IU]/h via INTRAVENOUS
  Filled 2024-02-27: qty 250

## 2024-02-27 MED ORDER — MAGNESIUM SULFATE 4 GM/100ML IV SOLN
4.0000 g | Freq: Once | INTRAVENOUS | Status: AC
Start: 2024-02-27 — End: 2024-02-27
  Administered 2024-02-27: 4 g via INTRAVENOUS
  Filled 2024-02-27: qty 100

## 2024-02-27 MED ORDER — POTASSIUM PHOSPHATES 15 MMOLE/5ML IV SOLN
30.0000 mmol | Freq: Once | INTRAVENOUS | Status: AC
  Administered 2024-02-27: 30 mmol via INTRAVENOUS
  Filled 2024-02-27: qty 10

## 2024-02-27 MED ORDER — ACETAMINOPHEN 500 MG PO TABS
1000.0000 mg | ORAL_TABLET | Freq: Four times a day (QID) | ORAL | Status: DC | PRN
  Administered 2024-02-27 (×2): 1000 mg via ORAL
  Filled 2024-02-27 (×2): qty 2

## 2024-02-27 MED ORDER — LACTATED RINGERS IV SOLN: INTRAVENOUS | Status: AC

## 2024-02-27 MED ORDER — PANTOPRAZOLE SODIUM 40 MG PO TBEC
40.0000 mg | DELAYED_RELEASE_TABLET | Freq: Every day | ORAL | Status: DC
  Administered 2024-02-27 – 2024-03-01 (×4): 40 mg via ORAL
  Filled 2024-02-27 (×4): qty 1

## 2024-02-27 NOTE — Assessment & Plan Note (Addendum)
-   suspected due to pancreatitis - s/p cath on 5/12, negative for disease - okay to d/c heparin  drip from cardiac standpoint, but will continue for now until no further GI procedures needed - Echo performed 02/27/2024: EF 65%, no RWMA, grade 1 DD

## 2024-02-27 NOTE — Assessment & Plan Note (Signed)
-

## 2024-02-27 NOTE — Assessment & Plan Note (Signed)
-   Tachycardia, neutropenia, hypotension requiring Levophed use.  Source suspected GI with E. coli bacteremia as complication - continue abx - now off pressors and BP stable

## 2024-02-27 NOTE — Assessment & Plan Note (Signed)
-   History of CABG - Heart catheterization performed 02/26/2024 with no obvious obstructions

## 2024-02-27 NOTE — Assessment & Plan Note (Signed)
-   4/4 bottles positive on admission for E. coli - UA negative - Presuming GI source at this time given underlying pancreatitis/duodenitis - Clinically has improved -Follow-up sensitivities and continue Rocephin  for now

## 2024-02-27 NOTE — Progress Notes (Signed)
 PHARMACY - ANTICOAGULATION CONSULT NOTE  Pharmacy Consult for heparin  Indication: h/o PE  Allergies  Allergen Reactions   Levofloxacin  Nausea And Vomiting    Patient Measurements: Height: 5\' 3"  (160 cm) Weight: 51.9 kg (114 lb 6.7 oz) IBW/kg (Calculated) : 52.4 HEPARIN  DW (KG): 51.3  Vital Signs: Temp: 97.8 F (36.6 C) (05/13 1939) Temp Source: Oral (05/13 1939) BP: 158/52 (05/13 1939) Pulse Rate: 82 (05/13 1939)  Labs: Recent Labs    02/26/24 0212 02/26/24 0219 02/26/24 0401 02/26/24 1629 02/26/24 2032 02/27/24 0344 02/27/24 1048 02/27/24 2155  HGB 10.8* 10.9*  --   --   --   --  9.8*  --   HCT 32.9* 32.0*  --   --   --   --  29.1*  --   PLT 155  --   --   --   --   --  104*  --   APTT 27  --   --    < > >200*  --  147* 73*  LABPROT 14.9  --   --   --   --   --   --   --   INR 1.2  --   --   --   --   --   --   --   HEPARINUNFRC  --   --   --   --   --  0.37 0.42  --   CREATININE 0.99 1.00  --   --   --  0.80  --   --   TROPONINIHS 20*  --  57*  --   --   --   --   --    < > = values in this interval not displayed.    Estimated Creatinine Clearance: 40.6 mL/min (by C-G formula based on SCr of 0.8 mg/dL).   Medical History: Past Medical History:  Diagnosis Date   Acute thyroiditis    CAD (coronary artery disease)    CAP (community acquired pneumonia) 12/2012   Essential hypertension, benign    GERD (gastroesophageal reflux disease)    History of blood transfusion 05/2008   "when I had my bypass"   Hyperkalemia    Hypertriglyceridemia    Other secondary thrombocytopenia    Polycystic ovaries    Postsurgical aortocoronary bypass status    Pure hypercholesterolemia    Type II diabetes mellitus (HCC)    Urinary tract infection, site not specified     Medications:  Medications Prior to Admission  Medication Sig Dispense Refill Last Dose/Taking   amLODipine  (NORVASC ) 5 MG tablet Take 1 tablet (5 mg total) by mouth daily. 90 tablet 3 Past Week    apixaban  (ELIQUIS ) 2.5 MG TABS tablet Take 1 tablet by mouth twice daily 60 tablet 5 02/25/2024 at 10:00 AM   atorvastatin  (LIPITOR) 20 MG tablet Take 1 tablet by mouth once daily 90 tablet 3 Past Week   diphenhydramine -acetaminophen  (TYLENOL  PM) 25-500 MG TABS Take 2 tablets by mouth at bedtime as needed (Sleep/Pain).   Unknown   fenofibrate  (TRICOR ) 145 MG tablet Take 1 tablet (145 mg total) by mouth every evening. 90 tablet 3 Past Week   LANTUS  SOLOSTAR 100 UNIT/ML injection Inject 12 Units into the skin at bedtime.   Past Week   losartan  (COZAAR ) 25 MG tablet Take 25 mg by mouth daily.   Past Week   metoprolol  tartrate (LOPRESSOR ) 25 MG tablet Take 1 tablet (25 mg total) by mouth 2 (two) times daily. 180  tablet 2 02/25/2024   pantoprazole  (PROTONIX ) 40 MG tablet Take 40 mg by mouth every morning.   Past Week   Vitamin D, Ergocalciferol, 50 MCG (2000 UT) CAPS Take 2,000 Units by mouth daily.  1 Past Week   gabapentin (NEURONTIN) 300 MG capsule Take 300 mg by mouth at bedtime. (Patient not taking: Reported on 02/26/2024)   Not Taking   ibandronate (BONIVA) 150 MG tablet Take 150 mg by mouth every 30 (thirty) days. (Patient not taking: Reported on 02/26/2024)   Not Taking   Scheduled:   Chlorhexidine Gluconate Cloth  6 each Topical Daily   feeding supplement  1 Container Oral TID BM   insulin  aspart  0-9 Units Subcutaneous Q4H   pantoprazole   40 mg Oral Daily   sodium chloride  flush  3 mL Intravenous Q12H   Infusions:   cefTRIAXone  (ROCEPHIN )  IV 2 g (02/27/24 2134)   heparin  500 Units/hr (02/27/24 2129)   lactated ringers 75 mL/hr at 02/27/24 2111    Assessment: 89yo female was sent emergently to cath lab for angiography, now to transition from apixaban  (last dose 5/11 am) for h/o PE in 2018 to heparin .  aPTT 73 - therapeutic   Goal of Therapy:  Heparin  level 0.3-0.7 units/ml aPTT 66-102 seconds Monitor platelets by anticoagulation protocol: Yes   Plan:  Continue heparin  at 500  units/hr Daily HL and PTT F/u transition to DOAC when able  Oralee Billow, PharmD, BCCCP Clinical Pharmacist 02/27/2024 10:29 PM

## 2024-02-27 NOTE — TOC Initial Note (Signed)
 Transition of Care [facility]) - Initial/Assessment Note    Patient Details  Name: Jennifer Warner MRN: [id number] Date of Birth: January 14, 1934  [facility] CM/SW Contact:    Jennifer Bras, RN Phone Number: [phone] 02/27/2024, 1:59 PM  Clinical Narrative:                 TOC CM spoke to pt and states she lives alone but will moving to [place] to stay with sister. States she has RW, wheelchair and bedside commode at home. Offered choice for [facility]. Pt states she is not sure if she will be at her home or sister's home. Gave permission to speak to sister. Jennifer Warner.   Will continue to follow for dc needs.   Expected Discharge Plan: Home w Home Health Services Barriers to Discharge: Continued Medical Work up   Patient Goals and CMS Choice Patient states their goals for this hospitalization and ongoing recovery are:: wants to go home before Friday CMS Medicare.gov Compare Post Acute Care list provided to:: Patient Choice offered to / list presented to : Patient      Expected Discharge Plan and Services   Discharge Planning Services: CM Consult Post Acute Care Choice: Home Health Living arrangements for the past 2 months: Mobile Home                                      Prior Living Arrangements/Services Living arrangements for the past 2 months: Mobile Home Lives with:: Self Patient language and need for interpreter reviewed:: Yes Do you feel safe going back to the place where you live?: Yes      Need for Family Participation in Patient Care: No (Comment) Care giver support system in place?: Yes (comment) Current home services: DME (rolling walker, wheelchair, bedside commode) Criminal Activity/Legal Involvement Pertinent to Current Situation/Hospitalization: No - Comment as needed  Activities of Daily Living   ADL Screening (condition at time of admission) Independently performs ADLs?: Yes (appropriate for developmental age) Is the patient deaf or  have difficulty hearing?: No Does the patient have difficulty seeing, even when wearing glasses/contacts?: No Does the patient have difficulty concentrating, remembering, or making decisions?: No  Permission Sought/Granted Permission sought to share information with : Case Manager, Family Supports, PCP Permission granted to share information with : Yes, Verbal Permission Granted  Share Information with NAME: Jennifer Warner  Permission granted to share info w AGENCY: Home health, PCP, DME  Permission granted to share info w Relationship: sister  Permission granted to share info w Contact Information: [phone]  Emotional Assessment Appearance:: Appears stated age Attitude/Demeanor/Rapport: Engaged Affect (typically observed): Accepting Orientation: : Oriented to Self, Oriented to Place, Oriented to  Time, Oriented to Situation   Psych Involvement: No (comment)  Admission diagnosis:  ST elevation myocardial infarction (STEMI), unspecified artery (HCC) [I21.3] Sepsis with acute organ dysfunction and septic shock, due to unspecified organism, unspecified organ dysfunction type (HCC) [A41.9, R65.21] Shock circulatory (HCC) [R57.9] Pancreatitis, acute [K85.90] Patient Active Problem List   Diagnosis Date Noted   Hypophosphatemia 02/27/2024   Pressure injury of skin 02/27/2024   Hypomagnesemia 02/27/2024   Hypokalemia 02/27/2024   History of pulmonary embolism 02/27/2024   E coli bacteremia 02/27/2024   DMII (diabetes mellitus, type 2) (HCC) 02/27/2024   Pancreatitis, acute 02/26/2024   Abnormal EKG 02/26/2024   Demand ischemia (HCC) 02/26/2024   PE (pulmonary thromboembolism) (  HCC) 08/14/2017   CKD (chronic kidney disease), stage III (HCC) 08/14/2017   Syncope 08/14/2017   Elevated troponin 08/14/2017   Type II diabetes mellitus with renal manifestations (HCC) 08/14/2017   Hyperlipidemia    Essential hypertension    Angina pectoris (HCC)    Unstable angina (HCC) 05/02/2016    Chronic cough 04/02/2013   Cavitating mass in righ lower lobe superior segment, 01/08/2013 02/12/2013   Community acquired pneumonia 01/07/2013   CAD (coronary artery disease)    Hypertriglyceridemia    Essential hypertension, benign    S/P CABG x 2    PCP:  Jennifer Grime, MD Pharmacy:   [facility] [address] [place] - [address] [place] [address] Phone: [phone] Fax: [phone]  [facility] [address], [place] - [address] [phone] [place] [address] Phone: [phone] Fax: [phone]  [id number] [place], [place] - [address] [place] [address] Phone: [phone] Fax: [phone]     Social Drivers of Health (SDOH) Social History: SDOH Screenings   Food Insecurity: No Food Insecurity (02/26/2024)  Housing: Low Risk  (02/26/2024)  Transportation Needs: No Transportation Needs (02/26/2024)  Utilities: Not At Risk (02/26/2024)  Financial Resource Strain: Low Risk  (02/21/2024)   Received from [facility]  Social Connections: Moderately Integrated (02/26/2024)  Tobacco Use: Low Risk  (02/21/2024)   Received from [facility]   SDOH Interventions:     Readmission Risk Interventions     No data to display

## 2024-02-27 NOTE — Assessment & Plan Note (Signed)
-   On Lipitor at home - Resume as able

## 2024-02-27 NOTE — Evaluation (Signed)
 Physical Therapy Evaluation Patient Details Name: Jennifer Warner MRN: [id number] DOB: 11/23/1933 Today's Date: 02/27/2024  History of Present Illness  Pt is a 89yo female who came to ED with code STEMI. Found to be hypotensive, acute edematous pancreatiis with possibel duodenitis PMH: h/o CAD s/p cabg 2009, HTN, HLD, DMII with hyperglycemia, PE on chronic a/c, sciatica  Clinical Impression  Pt admitted with above. PTA pt lived alone with use of RW. Pt did not drive. Pt plans on going to live with her sister who is 88yo so they'll have "each other." At this time pt functioning with contact guard assist, use of RW, and significant increase in time due to L LE sciatica pain. Pt very set in ways and demo's safety with transfers. Pt to benefit from [facility] PT upon d/c to progress back to indep without AD. Acute PT to cont to follow.        If plan is discharge home, recommend the following: A little help with walking and/or transfers;A little help with bathing/dressing/bathroom;Help with stairs or ramp for entrance;Assist for transportation   Can travel by private vehicle        Equipment Recommendations None recommended by PT  Recommendations for Other Services       Functional Status Assessment Patient has had a recent decline in their functional status and demonstrates the ability to make significant improvements in function in a reasonable and predictable amount of time.     Precautions / Restrictions Precautions Precautions: Fall Restrictions Weight Bearing Restrictions Per Provider Order: No      Mobility  Bed Mobility Overal bed mobility: Modified Independent             General bed mobility comments: HOB elevated, significant increased time, uses UEs to help L LE in/out of bed due to sciatica pain    Transfers Overall transfer level: Needs assistance Equipment used: Rolling walker (2 wheels) Transfers: Sit to/from Stand Sit to Stand: Contact guard assist            General transfer comment: increased time    Ambulation/Gait Ambulation/Gait assistance: Contact guard assist Gait Distance (Feet): 60 Feet Assistive device: Rolling walker (2 wheels) Gait Pattern/deviations: Step-through pattern, Decreased stride length, Antalgic Gait velocity: dec Gait velocity interpretation: <1.31 ft/sec, indicative of household ambulator   General Gait Details: antalgic gait pattern, HR inc to 130s during amb  Stairs            Wheelchair Mobility     Tilt Bed    Modified Rankin (Stroke Patients Only)       Balance Overall balance assessment: Needs assistance Sitting-balance support: Feet supported, No upper extremity supported Sitting balance-Leahy Scale: Fair     Standing balance support: Bilateral upper extremity supported, Reliant on assistive device for balance Standing balance-Leahy Scale: Poor Standing balance comment: reliant on RW due to L hip pain                             Pertinent Vitals/Pain Pain Assessment Pain Assessment: Faces Faces Pain Scale: Hurts even more Pain Location: L hip/sciatica    Home Living Family/patient expects to be discharged to:: Private residence Living Arrangements: Alone (but plans to move in with sister who lives in [place], [place]) Available Help at Discharge: Family;Available 24 hours/day (88yo sister) Type of Home: House Home Access: Ramped entrance       Home Layout: One level Home Equipment: Agricultural consultant (2 wheels);Shower seat;Wheelchair -  manual      Prior Function Prior Level of Function : Independent/Modified Independent             Mobility Comments: has been using RW ADLs Comments: indep     Extremity/Trunk Assessment   Upper Extremity Assessment Upper Extremity Assessment: Overall WFL for tasks assessed    Lower Extremity Assessment Lower Extremity Assessment: Generalized weakness (L LE weakness from sciatica)    Cervical / Trunk Assessment Cervical /  Trunk Assessment: Normal  Communication   Communication Communication: No apparent difficulties    Cognition Arousal: Alert Behavior During Therapy: WFL for tasks assessed/performed   PT - Cognitive impairments: No family/caregiver present to determine baseline                       PT - Cognition Comments: pt lives alone, demo's safety with mobility despite L hip pain Following commands: Intact       Cueing Cueing Techniques: Verbal cues     General Comments General comments (skin integrity, edema, etc.): HR inc to 130s from 100s during amb, SpO2 > 92% on RA    Exercises     Assessment/Plan    PT Assessment Patient needs continued PT services  PT Problem List Decreased strength;Decreased activity tolerance;Decreased balance;Decreased mobility;Pain       PT Treatment Interventions DME instruction;Gait training;Functional mobility training;Therapeutic activities;Therapeutic exercise;Neuromuscular re-education    PT Goals (Current goals can be found in the Care Plan section)  Acute Rehab PT Goals Patient Stated Goal: improve pain and go home PT Goal Formulation: With patient Time For Goal Achievement: 03/12/24 Potential to Achieve Goals: Good    Frequency Min 2X/week     Co-evaluation               AM-PAC PT "6 Clicks" Mobility  Outcome Measure Help needed turning from your back to your side while in a flat bed without using bedrails?: A Little Help needed moving from lying on your back to sitting on the side of a flat bed without using bedrails?: A Little Help needed moving to and from a bed to a chair (including a wheelchair)?: A Little Help needed standing up from a chair using your arms (e.g., wheelchair or bedside chair)?: A Little Help needed to walk in hospital room?: A Little Help needed climbing 3-5 steps with a railing? : A Little 6 Click Score: 18    End of Session Equipment Utilized During Treatment: Gait belt Activity Tolerance:  Patient tolerated treatment well Patient left: in chair;with call bell/phone within reach Nurse Communication: Mobility status;Patient requests pain meds PT Visit Diagnosis: Unsteadiness on feet (R26.81);Muscle weakness (generalized) (M62.81)    Time: 1610-9604 PT Time Calculation (min) (ACUTE ONLY): 26 min   Charges:   PT Evaluation $PT Eval Low Complexity: 1 Low PT Treatments $Gait Training: 8-22 mins PT General Charges $$ ACUTE PT VISIT: 1 Visit         Renaee Caro, PT, DPT Acute Rehabilitation Services Secure chat preferred Office #: [phone]   Jenna Moan 02/27/2024, 3:03 PM

## 2024-02-27 NOTE — Consult Note (Signed)
 Consultation Note   Referring Provider:  PCCM PCP: Virgle Grime, MD Primary Gastroenterologist::   Unassigned       Reason for Consultation: pancreatitis DOA: 02/26/2024         Hospital Day: 2   ASSESSMENT    89 yo female with a history of CAD s/p remote CABG who presented to ED as code STEMI but Cardiology evaluated and doesn't suspect ACS. Being evaluated for sepsis / ? UTI but also found on CT angio to have pancreatitis vr duodenitis.   Upper abdominal pain / chest pain / acute pancreatitis vrs duodenitis on CT angio. Suspect pancreas is the primary source of inflammation with possible reactive duodenitis. Lipase > 2000. Etiology of acute pancreatitis unclear. She looks okay, abdominal exam not overly concerning. Not hemoconcentrated. Renal function normal. -No Etoh use.  -Remote cholecystectomy , no biliary duct dilation on imaging . She did have a mildly elevated bilirubin and mild elevation of AST on admission number improved. -Triglycerides and serum calcium  normal.  --Possibly medication related? Takes Losartan  as well as a statin and both have been associated with pancreatitis. --Underlying pancreatic malignancy is also a consideration.   History of PE.   Eliquis  on hold. Getting IV heparin   GERD Takes Pantoprazole  as needed  PLAN:   --Continue supportive care --Has clear liquid tray, will she how she does with that. She is feeling better today --Will need follow up imaging at some point to evaluate for an underlying pancreatic lesion.  --Adding PO Pantoprazole  for history of GERD  HPI   89 y.o. year old female with a medical history including but not limited to CAD / remote CABG, HTN, DM2, PE on chronic anticoagulation  Patient admitted yesterday with  ( ? Chest pain) / hypotension. She was started on pressors. She had EKG changes . CTA negative for acute dissection . Cardiology evaluated.  Coronary and bypass  graft angiogram were reassuring with patent grafts and no significant change in severe native vessel disease.  CT angio did demonstrate edematous pancreas and / or duodenitis   Remarkable labs lipase was > 2800. Tbili mildly 1.7, AST 160, normal ALT Phos. Triglycerides normal. Serum calcium  normal.   Tyshema describes onset of chest and LUQ / epigastric pain about 4 days ago. Pain radiated through to her back. No associated N/V. She has never had this pain before. She ate very little once the pain started. She doesn't consume Etoh. No FMH of pancreatic disease. Other than this she has a history of GERD, takes Pantoprazole  as needed. She occasionally takes something for constipation.   Previous GI Studies   [place]on[place] > [place]  Labs and Imaging:  Recent Labs    02/26/24 0212 02/26/24 0219  WBC 2.8*  --   HGB 10.8* 10.9*  HCT 32.9* 32.0*  MCV 98.2  --   PLT 155  --    No results for input(s): "FOLATE", "VITAMINB12", "FERRITIN", "TIBC", "IRONPCTSAT" in the last 72 hours. Recent Labs    02/26/24 0212 02/26/24 0219 02/27/24 0344  NA 140 140 142  K 3.8 3.8 3.4*  CL 107 106 112*  CO2 21*  --  24  GLUCOSE 100* 95 90  BUN 19  20 13  CREATININE 0.99 1.00 0.80  CALCIUM  10.0  --  8.4*   Recent Labs    02/26/24 0212 02/27/24 0344  PROT 5.6* 4.1*  ALBUMIN  3.1* 2.1*  AST 160* 84*  ALT 39 36  ALKPHOS 78 47  BILITOT 1.7* 0.8   Recent Labs    02/26/24 0212  INR 1.2   No results for input(s): "AFPTUMOR" in the last 72 hours.  CARDIAC CATHETERIZATION Addendum: Coronary and bypass graft angiography 02/26/2024:  LM: Normal  LAD: Prox 100% occlusion  Lcx: Dominant vessel. 100% mid vessel occlusion  RCA: Small nondominant vessel with moderate disease  LIMA-LAD: Patent, no significant disease  SVG-OM: Patent, no significant disease   LVEDP could not be calculated   No acute cardiac cause to explain circulatory shock  BP much better in the cath lab,  therefore norepinephrine turned off  Will need medical admission for management of likely pancreatitis.   Last dose of Eliquis  was 5/11 AM. Will need anticoagulation after 7:30 (2  hrs since Mynx placement)  Will place order for heparin .   Cody Das, MD Narrative: Coronary and bypass graft angiography 02/26/2024: LM: Normal LAD: Prox 100% occlusion Lcx: Dominant vessel. 100% mid vessel occlusion RCA: Small nondominant vessel with moderate disease LIMA-LAD: Patent, no significant disease SVG-OM: Patent, no significant disease  LVEDP could not be calculated  No acute cardiac cause to explain circulatory shock BP much better in the cath lab, therefore norepinephrine turned off Will need medical admission for management of likely pancreatitis CT ANGIO CHEST AORTA W/CM &/OR WO/CM CLINICAL DATA:  Claudication or leg ischemia.  EXAM: CT ANGIOGRAPHY OF THORACOABDOMINAL AORTA WITH ILIOFEMORAL RUNOFF  TECHNIQUE: Multidetector CT imaging of the chest, abdomen, pelvis and lower extremities was performed using the standard protocol during bolus administration of intravenous contrast. Multiplanar CT image reconstructions and MIPs were obtained to evaluate the vascular anatomy.  RADIATION DOSE REDUCTION: This exam was performed according to the departmental dose-optimization program which includes automated exposure control, adjustment of the mA and/or kV according to patient size and/or use of iterative reconstruction technique.  CONTRAST:  75mL OMNIPAQUE  IOHEXOL  350 MG/ML SOLN  COMPARISON:  None Available.  FINDINGS: CHEST  No cardiac enlargement or pericardial effusion. Extensive atheromatous calcification of the aorta and coronaries with CABG. LIMA and a venous graft are enhancing. No aortic dilatation, dissection, or intramural hematoma.  Small sliding hiatal hernia.  No mass, adenopathy, or hematoma.  There is no edema, consolidation, effusion, or  pneumothorax.  No acute or aggressive osseous finding  VASCULAR  Aorta: Confluent atheromatous plaque, primarily calcified  Celiac: Prominent plaque at the bifurcation with over 50% stenosis by sagittal reformats. No emergent finding  SMA: Plaque at the ostium without significant stenosis. No beading, branch occlusion, or ulcer.  Renals: Atheromatous calcification at the renal artery ostia. There could be hemodynamically significant stenosis on the right. No beading or aneurysm.  IMA: Patent  RIGHT Lower Extremity  Inflow: Atheromatous plaque without dissection, aneurysm, or flow reducing stenosis.  Outflow: Generalized atheromatous plaque in the SFA without focal and flow reducing stenosis. Bulky calcified plaque at the above the knee popliteal artery with high-grade stenosis.  Runoff: Confluent calcification which limits visualization of luminal enhancement. As well, the scan likely out paces the bolus when comparing to the right. There is patency at least to the tibioperoneal trunk.  LEFT Lower Extremity  Inflow: Bulky calcified plaque at the proximal common iliac artery causes advanced stenosis, patent lumen difficult to even  measure on coronal reformats. No dissection or aneurysm.  Outflow: Notable plaque in the SFA just beyond the common femoral bifurcation which is likely at least 50% based on coronal reformats as marked on series 11. SFA atheromatous calcification is widespread with accentuated calcified plaque at the popliteal artery where there is an additional flow reducing stenosis on 9:243  Runoff: Limited by bolus timing and heavily calcified arteries, there is at least patency to the tibioperoneal trunk. No filling of the anterior tibial artery throughout the leg.  Veins: Negative  Review of the MIP images confirms the above findings.  NON-VASCULAR  Hepatobiliary: No focal liver abnormality.Cholecystectomy. No biliary dilatation.  Pancreas:  Expanded head with regional edema. No ductal dilatation or visible mass.  Spleen: Unremarkable.  Adrenals/Urinary Tract: Negative adrenals. No hydronephrosis or stone. Unremarkable bladder.  Stomach/Bowel: Low-density thickening of the distal stomach and duodenum wall with regional edema. Mid duodenal diverticula. Innumerable colonic diverticula.  Lymphatic: No mass or adenopathy.  Reproductive:Hysterectomy  Other: No ascites or pneumoperitoneum. Tiny fatty left groin hernia.  Musculoskeletal: No acute abnormalities. Lumbar spine degeneration focally severe at L3-4. Non worrisome enchondroma in the left distal femur.  IMPRESSION: Negative for acute aortic syndrome.  No emergent arterial finding in the lower extremities, limited below the knees due to degree of calcified peripheral vascular disease and bolus timing. Chronic flow reducing atheromatous stenoses in the left common iliac, left SFA, and bilateral popliteal arteries.  Acute edematous pancreatitis and/or duodenitis.  Electronically Signed   By: Ronnette Coke M.D.   On: 02/26/2024 04:55 CT Angio Aortobifemoral W and/or Wo Contrast CLINICAL DATA:  Claudication or leg ischemia.  EXAM: CT ANGIOGRAPHY OF THORACOABDOMINAL AORTA WITH ILIOFEMORAL RUNOFF  TECHNIQUE: Multidetector CT imaging of the chest, abdomen, pelvis and lower extremities was performed using the standard protocol during bolus administration of intravenous contrast. Multiplanar CT image reconstructions and MIPs were obtained to evaluate the vascular anatomy.  RADIATION DOSE REDUCTION: This exam was performed according to the departmental dose-optimization program which includes automated exposure control, adjustment of the mA and/or kV according to patient size and/or use of iterative reconstruction technique.  CONTRAST:  75mL OMNIPAQUE  IOHEXOL  350 MG/ML SOLN  COMPARISON:  None Available.  FINDINGS: CHEST  No cardiac enlargement or  pericardial effusion. Extensive atheromatous calcification of the aorta and coronaries with CABG. LIMA and a venous graft are enhancing. No aortic dilatation, dissection, or intramural hematoma.  Small sliding hiatal hernia.  No mass, adenopathy, or hematoma.  There is no edema, consolidation, effusion, or pneumothorax.  No acute or aggressive osseous finding  VASCULAR  Aorta: Confluent atheromatous plaque, primarily calcified  Celiac: Prominent plaque at the bifurcation with over 50% stenosis by sagittal reformats. No emergent finding  SMA: Plaque at the ostium without significant stenosis. No beading, branch occlusion, or ulcer.  Renals: Atheromatous calcification at the renal artery ostia. There could be hemodynamically significant stenosis on the right. No beading or aneurysm.  IMA: Patent  RIGHT Lower Extremity  Inflow: Atheromatous plaque without dissection, aneurysm, or flow reducing stenosis.  Outflow: Generalized atheromatous plaque in the SFA without focal and flow reducing stenosis. Bulky calcified plaque at the above the knee popliteal artery with high-grade stenosis.  Runoff: Confluent calcification which limits visualization of luminal enhancement. As well, the scan likely out paces the bolus when comparing to the right. There is patency at least to the tibioperoneal trunk.  LEFT Lower Extremity  Inflow: Bulky calcified plaque at the proximal common iliac artery causes advanced stenosis, patent  lumen difficult to even measure on coronal reformats. No dissection or aneurysm.  Outflow: Notable plaque in the SFA just beyond the common femoral bifurcation which is likely at least 50% based on coronal reformats as marked on series 11. SFA atheromatous calcification is widespread with accentuated calcified plaque at the popliteal artery where there is an additional flow reducing stenosis on 9:243  Runoff: Limited by bolus timing and heavily calcified  arteries, there is at least patency to the tibioperoneal trunk. No filling of the anterior tibial artery throughout the leg.  Veins: Negative  Review of the MIP images confirms the above findings.  NON-VASCULAR  Hepatobiliary: No focal liver abnormality.Cholecystectomy. No biliary dilatation.  Pancreas: Expanded head with regional edema. No ductal dilatation or visible mass.  Spleen: Unremarkable.  Adrenals/Urinary Tract: Negative adrenals. No hydronephrosis or stone. Unremarkable bladder.  Stomach/Bowel: Low-density thickening of the distal stomach and duodenum wall with regional edema. Mid duodenal diverticula. Innumerable colonic diverticula.  Lymphatic: No mass or adenopathy.  Reproductive:Hysterectomy  Other: No ascites or pneumoperitoneum. Tiny fatty left groin hernia.  Musculoskeletal: No acute abnormalities. Lumbar spine degeneration focally severe at L3-4. Non worrisome enchondroma in the left distal femur.  IMPRESSION: Negative for acute aortic syndrome.  No emergent arterial finding in the lower extremities, limited below the knees due to degree of calcified peripheral vascular disease and bolus timing. Chronic flow reducing atheromatous stenoses in the left common iliac, left SFA, and bilateral popliteal arteries.  Acute edematous pancreatitis and/or duodenitis.  Electronically Signed   By: Ronnette Coke M.D.   On: 02/26/2024 04:55 DG Chest Portable 1 View CLINICAL DATA:  Initial evaluation for acute sepsis.  EXAM: PORTABLE CHEST 1 VIEW  COMPARISON:  Prior study from 08/14/2017.  FINDINGS: Median sternotomy wires with underlying surgical clips. Transverse heart size within normal limits. Mediastinal silhouette normal. Aortic atherosclerosis.  Lungs are hypoinflated. Streaky opacities within the mid and right upper lung, likely atelectasis and/or scarring. No consolidative airspace disease. No pulmonary edema or pleural effusion.  No pneumothorax.  Visualized osseous structures demonstrate no acute finding. Osteopenia noted.  IMPRESSION: 1. Streaky opacities within the mid and right upper lung, likely atelectasis and/or scarring. 2. No other active cardiopulmonary disease.  Electronically Signed   By: Virgia Griffins M.D.   On: 02/26/2024 04:22    Past Medical History:  Diagnosis Date   Acute thyroiditis    CAD (coronary artery disease)    CAP (community acquired pneumonia) 12/2012   Essential hypertension, benign    GERD (gastroesophageal reflux disease)    History of blood transfusion 05/2008   "when I had my bypass"   Hyperkalemia    Hypertriglyceridemia    Other secondary thrombocytopenia    Polycystic ovaries    Postsurgical aortocoronary bypass status    Pure hypercholesterolemia    Type II diabetes mellitus (HCC)    Urinary tract infection, site not specified     Past Surgical History:  Procedure Laterality Date   CARDIAC CATHETERIZATION  05/2008   CARDIAC CATHETERIZATION N/A 05/03/2016   Procedure: Left Heart Cath and Coronary Angiography;  Surgeon: Arty Binning, MD;  Location: [facility] INVASIVE CV LAB;  Service: Cardiovascular;  Laterality: N/A;   CATARACT EXTRACTION W/ INTRAOCULAR LENS  IMPLANT, BILATERAL Bilateral ~ 2000   CESAREAN SECTION  1964   CORONARY ARTERY BYPASS GRAFT  05/2008   X2, [place] to LAD, SVG to PDA   DILATION AND CURETTAGE OF UTERUS     LAPAROSCOPIC CHOLECYSTECTOMY  1990s   LEFT  HEART CATH AND CORS/GRAFTS ANGIOGRAPHY N/A 02/26/2024   Procedure: LEFT HEART CATH AND CORS/GRAFTS ANGIOGRAPHY;  Surgeon: Cody Das, MD;  Location: [facility] INVASIVE CV LAB;  Service: Cardiovascular;  Laterality: N/A;   TUBAL LIGATION      Family History  Problem Relation Age of Onset   Liver disease Mother 16       deceased   Cancer Brother        x3   Stroke Brother    Breast cancer Neg Hx     Prior to Admission medications   Medication Sig Start Date End Date Taking?  Authorizing Provider  amLODipine  (NORVASC ) 5 MG tablet Take 1 tablet (5 mg total) by mouth daily. 07/13/23  Yes [place], Peter M, MD  apixaban  (ELIQUIS ) 2.5 MG TABS tablet Take 1 tablet by mouth twice daily 12/12/23  Yes [place], Peter M, MD  atorvastatin  (LIPITOR) 20 MG tablet Take 1 tablet by mouth once daily 09/21/23  Yes [place], Peter M, MD  diphenhydramine -acetaminophen  (TYLENOL  PM) 25-500 MG TABS Take 2 tablets by mouth at bedtime as needed (Sleep/Pain).   Yes [provider]  fenofibrate  (TRICOR ) 145 MG tablet Take 1 tablet (145 mg total) by mouth every evening. 05/31/23  Yes [place], Peter M, MD  LANTUS  SOLOSTAR 100 UNIT/ML injection Inject 12 Units into the skin at bedtime. 09/22/12  Yes [provider]  losartan  (COZAAR ) 25 MG tablet Take 25 mg by mouth daily. 01/17/24  Yes [provider]  metoprolol  tartrate (LOPRESSOR ) 25 MG tablet Take 1 tablet (25 mg total) by mouth 2 (two) times daily. 10/16/23  Yes [place], Peter M, MD  pantoprazole  (PROTONIX ) 40 MG tablet Take 40 mg by mouth every morning.   Yes [provider]  Vitamin D, Ergocalciferol, 50 MCG (2000 UT) CAPS Take 2,000 Units by mouth daily. 04/04/16  Yes [provider]  gabapentin (NEURONTIN) 300 MG capsule Take 300 mg by mouth at bedtime. Patient not taking: Reported on 02/26/2024 02/21/24   [provider]  ibandronate (BONIVA) 150 MG tablet Take 150 mg by mouth every 30 (thirty) days. Patient not taking: Reported on 02/26/2024 11/07/23   [provider]    Current Facility-Administered Medications  Medication Dose Route Frequency Provider Last Rate Last Admin   acetaminophen  (TYLENOL ) tablet 650 mg  650 mg Oral Q4H PRN Patwardhan, Manish J, MD   650 mg at 02/26/24 1613   cefTRIAXone  (ROCEPHIN ) 2 g in sodium chloride  0.9 % 100 mL IVPB  2 g Intravenous Q24H Pham, Minh Q, RPH-CPP   Stopped at 02/26/24 2254   Chlorhexidine Gluconate Cloth 2 % PADS 6 each  6 each Topical Daily  Lezlie Reddish, DO   6 each at 02/26/24 1100   docusate sodium (COLACE) capsule 100 mg  100 mg Oral BID PRN Lezlie Reddish, DO       heparin  ADULT infusion 100 units/mL (25000 units/250mL)  650 Units/hr Intravenous Continuous Pham, Minh Q, RPH-CPP 6.5 mL/hr at 02/27/24 1000 650 Units/hr at 02/27/24 1000   insulin  aspart (novoLOG ) injection 0-9 Units  0-9 Units Subcutaneous Q4H Lezlie Reddish, DO   2 Units at 02/26/24 1149   lactated ringers infusion   Intravenous Continuous Faith Homes, MD 75 mL/hr at 02/27/24 1034 Rate Change at 02/27/24 1034   magnesium  sulfate IVPB 4 g 100 mL  4 g Intravenous Once Girguis, David, MD 50 mL/hr at 02/27/24 1030 4 g at 02/27/24 1030   ondansetron  (ZOFRAN ) injection 4 mg  4 mg Intravenous Q6H  PRN Cody Das, MD       Oral care mouth rinse  15 mL Mouth Rinse PRN Lezlie Reddish, DO       polyethylene glycol (MIRALAX / GLYCOLAX) packet 17 g  17 g Oral Daily PRN Lezlie Reddish, DO       potassium PHOSPHATE 30 mmol in dextrose  5 % 500 mL infusion  30 mmol Intravenous Once Girguis, David, MD 85 mL/hr at 02/27/24 1026 30 mmol at 02/27/24 1026   sodium chloride  flush (NS) 0.9 % injection 3 mL  3 mL Intravenous Q12H Patwardhan, Manish J, MD   3 mL at 02/27/24 1030   sodium chloride  flush (NS) 0.9 % injection 3 mL  3 mL Intravenous PRN Patwardhan, Kaye Parsons, MD        Allergies as of 02/26/2024 - Review Complete 02/26/2024  Allergen Reaction Noted   Levofloxacin  Nausea And Vomiting 04/17/2013    Social History   Socioeconomic History   Marital status: Married    Spouse name: Not on file   Number of children: 2   Years of education: Not on file   Highest education level: Not on file  Occupational History   Occupation: retired- Wellsite geologist    Comment: 2010  Tobacco Use   Smoking status: Never   Smokeless tobacco: Never  Substance and Sexual Activity   Alcohol use: No   Drug use: No   Sexual activity: Not Currently  Other  Topics Concern   Not on file  Social History Narrative   Right and Left Handed    Lives in a one story home. Lives with husband.    Social Drivers of Corporate investment banker Strain: Low Risk  (02/21/2024)   [facility]   Overall Financial Resource Strain (CARDIA)    Difficulty of Paying Living Expenses: Not hard at all  Food Insecurity: No Food Insecurity (02/26/2024)   Hunger Vital Sign    Worried About Running Out of Food in the Last Year: Never true    Ran Out of Food in the Last Year: Never true  Transportation Needs: No Transportation Needs (02/26/2024)   PRAPARE - Administrator, Civil Service (Medical): No    Lack of Transportation (Non-Medical): No  Physical Activity: Not on file  Stress: Not on file  Social Connections: Moderately Integrated (02/26/2024)   Social Connection and Isolation Panel [NHANES]    Frequency of Communication with Friends and Family: More than three times a week    Frequency of Social Gatherings with Friends and Family: More than three times a week    Attends Religious Services: More than 4 times per year    Active Member of [employer] or Organizations: Yes    Attends Engineer, structural: More than 4 times per year    Marital Status: Divorced  Intimate Partner Violence: Not At Risk (02/26/2024)   Humiliation, Afraid, Rape, and Kick questionnaire    Fear of Current or Ex-Partner: No    Emotionally Abused: No    Physically Abused: No    Sexually Abused: No     Code Status   Code Status: Full Code  Review of Systems: All systems reviewed and negative except where noted in HPI.  Physical Exam: Vital signs in last 24 hours: Temp:  [97.5 F (36.4 C)-98.9 F (37.2 C)] 98 F (36.7 C) (05/13 0700) Pulse Rate:  [65-107] 92 (05/13 1000) Resp:  [0-24] 23 (05/13 1000) BP: (75-148)/(30-86) 117/77 (05/13 1000)  SpO2:  [91 %-100 %] 94 % (05/13 1000) Weight:  [51.9 kg] 51.9 kg (05/13 0426) Last BM Date :   (PTA)  General:  Pleasant female in NAD Psych:  Cooperative. Normal mood and affect Eyes: Pupils equal Ears:  Normal auditory acuity Nose: No deformity, discharge or lesions Neck:  Supple, no masses felt Lungs:  Clear to auscultation.  Heart:  Regular rate.  Abdomen:  Soft, nondistended, mild LUQ tenderness. Active bowel sounds, no masses felt Rectal :  Deferred Msk: Symmetrical without gross deformities.  Neurologic:  Alert, oriented, grossly normal neurologically Extremities : No edema Skin:  Intact without significant lesions.    Intake/Output from previous day: 05/12 0701 - 05/13 0700 In: 3388.4 [P.O.:480; I.V.:2808.4; IV Piggyback:100] Out: 2000 [Urine:2000] Intake/Output this shift:  Total I/O In: 192.6 [I.V.:192.6] Out: -    Mai Schwalbe, NP-C   02/27/2024, 11:22 AM

## 2024-02-27 NOTE — Progress Notes (Signed)
 PHARMACY - ANTICOAGULATION CONSULT NOTE  Pharmacy Consult for heparin  Indication: h/o PE  Allergies  Allergen Reactions   Levofloxacin  Nausea And Vomiting    Patient Measurements: Height: 5\' 3"  (160 cm) Weight: 51.9 kg (114 lb 6.7 oz) IBW/kg (Calculated) : 52.4 HEPARIN  DW (KG): 51.3  Vital Signs: Temp: 97.5 F (36.4 C) (05/13 1100) Temp Source: Axillary (05/13 1100) BP: 134/62 (05/13 1100) Pulse Rate: 79 (05/13 1100)  Labs: Recent Labs    02/26/24 0212 02/26/24 0219 02/26/24 0401 02/26/24 1629 02/26/24 1838 02/26/24 2032 02/27/24 0344 02/27/24 1048  HGB 10.8* 10.9*  --   --   --   --   --   --   HCT 32.9* 32.0*  --   --   --   --   --   --   PLT 155  --   --   --   --   --   --   --   APTT 27  --   --    < > >200* >200*  --  147*  LABPROT 14.9  --   --   --   --   --   --   --   INR 1.2  --   --   --   --   --   --   --   HEPARINUNFRC  --   --   --   --   --   --  0.37 0.42  CREATININE 0.99 1.00  --   --   --   --  0.80  --   TROPONINIHS 20*  --  57*  --   --   --   --   --    < > = values in this interval not displayed.    Estimated Creatinine Clearance: 40.6 mL/min (by C-G formula based on SCr of 0.8 mg/dL).   Medical History: Past Medical History:  Diagnosis Date   Acute thyroiditis    CAD (coronary artery disease)    CAP (community acquired pneumonia) 12/2012   Essential hypertension, benign    GERD (gastroesophageal reflux disease)    History of blood transfusion 05/2008   "when I had my bypass"   Hyperkalemia    Hypertriglyceridemia    Other secondary thrombocytopenia    Polycystic ovaries    Postsurgical aortocoronary bypass status    Pure hypercholesterolemia    Type II diabetes mellitus (HCC)    Urinary tract infection, site not specified     Medications:  Medications Prior to Admission  Medication Sig Dispense Refill Last Dose/Taking   amLODipine  (NORVASC ) 5 MG tablet Take 1 tablet (5 mg total) by mouth daily. 90 tablet 3 Past Week    apixaban  (ELIQUIS ) 2.5 MG TABS tablet Take 1 tablet by mouth twice daily 60 tablet 5 02/25/2024 at 10:00 AM   atorvastatin  (LIPITOR) 20 MG tablet Take 1 tablet by mouth once daily 90 tablet 3 Past Week   diphenhydramine -acetaminophen  (TYLENOL  PM) 25-500 MG TABS Take 2 tablets by mouth at bedtime as needed (Sleep/Pain).   Unknown   fenofibrate  (TRICOR ) 145 MG tablet Take 1 tablet (145 mg total) by mouth every evening. 90 tablet 3 Past Week   LANTUS  SOLOSTAR 100 UNIT/ML injection Inject 12 Units into the skin at bedtime.   Past Week   losartan  (COZAAR ) 25 MG tablet Take 25 mg by mouth daily.   Past Week   metoprolol  tartrate (LOPRESSOR ) 25 MG tablet Take 1 tablet (25 mg total) by  mouth 2 (two) times daily. 180 tablet 2 02/25/2024   pantoprazole  (PROTONIX ) 40 MG tablet Take 40 mg by mouth every morning.   Past Week   Vitamin D, Ergocalciferol, 50 MCG (2000 UT) CAPS Take 2,000 Units by mouth daily.  1 Past Week   gabapentin (NEURONTIN) 300 MG capsule Take 300 mg by mouth at bedtime. (Patient not taking: Reported on 02/26/2024)   Not Taking   ibandronate (BONIVA) 150 MG tablet Take 150 mg by mouth every 30 (thirty) days. (Patient not taking: Reported on 02/26/2024)   Not Taking   Scheduled:   Chlorhexidine Gluconate Cloth  6 each Topical Daily   insulin  aspart  0-9 Units Subcutaneous Q4H   pantoprazole   40 mg Oral Daily   sodium chloride  flush  3 mL Intravenous Q12H   Infusions:   cefTRIAXone  (ROCEPHIN )  IV Stopped (02/26/24 2254)   lactated ringers 75 mL/hr at 02/27/24 1100   magnesium  sulfate bolus IVPB 50 mL/hr at 02/27/24 1100   potassium PHOSPHATE IVPB (in mmol) 85 mL/hr at 02/27/24 1100    Assessment: 89yo female was sent emergently to cath lab for angiography, now to transition from apixaban  (last dose 5/11 am) for h/o PE in 2018 to heparin .  aPTT remains elevated (124) despite holding infusion x2h and decreasing to 650 units/hr.  Some oozing noted at PIV heparin  infusing through, no other  s/sx bleeding.    Goal of Therapy:  Heparin  level 0.3-0.7 units/ml aPTT 66-102 seconds Monitor platelets by anticoagulation protocol: Yes   Plan:  Hold heparin  x1 hrs Restart at 500 units/hr Check 8 hr HL and PTT F/u transition to DOAC when able  Cecillia Cogan, PharmD Clinical Pharmacist 02/27/2024  12:41 PM

## 2024-02-27 NOTE — Progress Notes (Signed)
 Progress Note    Jennifer Warner   ZOX:[id number]  DOB: Nov 20, 1933  DOA: 02/26/2024     1 PCP: Virgle Grime, MD  Initial CC: abd pain, back pain, chest pain  Hospital Course: Jennifer Warner is an 89 year old female with PMH GERD, CAD s/p CABG, HTN, HLD, DM II, history of PE on chronic Eliquis , anemia who presented with chest pain, abdominal pain, back pain. She had elevated troponin with EKG changes and was taken for catheterization for further workup.  Heart cath showed no significant obstruction nor cardiac cause.  She also had developed worsening hypotension on admission warranting use of Levophed. Further workup had also shown elevated lipase, acute edematous pancreatitis with possible duodenitis. Urinalysis was negative for signs of infection.  Blood cultures showed 4/4 bottles with E. coli.  Interval History:  States that she feels significantly improved today.  No longer has any further chest pain or back pain.  Still having some mild abdominal pain mostly in the right upper quadrant.  Tolerating liquids.  Denies any nausea or vomiting.  Assessment and Plan: * Septic shock (HCC)-resolved as of 02/27/2024 - Tachycardia, neutropenia, hypotension requiring Levophed use.  Source suspected GI with E. coli bacteremia as complication - continue abx - now off pressors and BP stable  E coli bacteremia - 4/4 bottles positive on admission for E. coli - UA negative - Presuming GI source at this time given underlying pancreatitis/duodenitis - Clinically has improved -Follow-up sensitivities and continue Rocephin  for now  Pancreatitis, acute - Patient presented with chest pain, abdominal pain, back pain -Etiology still unclear. -Lipase > 2,800; TG 100; denies etoh use; unclear if med changes but sounds like none; CT negative for biliary dilation; hx CCY - consulting GI for assistance with diet advancement and potential etiology and with ?duodenitis on CT - continue IVF for now as  diet advances - If no plans for endoscopy procedures, we can transition heparin  drip back to Eliquis   History of pulmonary embolism - On chronic Eliquis  at home - Currently on heparin  drip - Continue drip for now until no further need for any procedures then okay to transition back to Eliquis   Demand ischemia (HCC) - suspected due to pancreatitis - s/p cath on 5/12, negative for disease - okay to d/c heparin  drip from cardiac standpoint, but will continue for now until no further GI procedures needed - Echo performed 02/27/2024: EF 65%, no RWMA, grade 1 DD  DMII (diabetes mellitus, type 2) (HCC) - Continue SSI and CBG monitoring  Hypokalemia - Replete as needed  Hypomagnesemia - Replete as needed  Hypophosphatemia - Replete as needed  Essential hypertension - Required pressors on admission - Continue holding BP meds for now  Hyperlipidemia - On Lipitor at home - Resume as able  CAD (coronary artery disease) - History of CABG - Heart catheterization performed 02/26/2024 with no obvious obstructions   Old records reviewed in assessment of this patient  Antimicrobials: Rocephin  02/26/2024 >> current  DVT prophylaxis:  SCD's Start: 02/26/24 8119 SCDs Start: 02/26/24 0555   Code Status:   Code Status: Full Code  Mobility Assessment (Last 72 Hours)     Mobility Assessment   No documentation.           Barriers to discharge: None Disposition Plan: Home HH orders placed: N/A Status is: Inpatient  Objective: Blood pressure (!) 112/59, pulse 94, temperature (!) 97.5 F (36.4 C), temperature source Axillary, resp. rate (!) 24, height 5\' 3"  (1.6 m), weight 51.9 kg,  SpO2 94%.  Examination:  Physical Exam Constitutional:      Appearance: Normal appearance.  HENT:     Head: Normocephalic and atraumatic.     Mouth/Throat:     Mouth: Mucous membranes are moist.  Eyes:     Extraocular Movements: Extraocular movements intact.  Cardiovascular:     Rate and  Rhythm: Normal rate and regular rhythm.  Pulmonary:     Effort: Pulmonary effort is normal. No respiratory distress.     Breath sounds: Normal breath sounds. No wheezing.  Abdominal:     General: Bowel sounds are normal. There is no distension.     Palpations: Abdomen is soft.     Tenderness: There is abdominal tenderness in the right upper quadrant.  Musculoskeletal:        General: Normal range of motion.     Cervical back: Normal range of motion and neck supple.  Skin:    General: Skin is warm and dry.  Neurological:     General: No focal deficit present.     Mental Status: She is alert.  Psychiatric:        Mood and Affect: Mood normal.      Consultants:  GI Cardiology  Procedures:  5/12: LHC  Data Reviewed: Results for orders placed or performed during the hospital encounter of 02/26/24 (from the past 24 hours)  Glucose, capillary     Status: Abnormal   Collection Time: 02/26/24  4:19 PM  Result Value Ref Range   Glucose-Capillary 109 (H) 70 - 99 mg/dL  APTT     Status: Abnormal   Collection Time: 02/26/24  4:29 PM  Result Value Ref Range   aPTT 193 (HH) 24 - 36 seconds  APTT     Status: Abnormal   Collection Time: 02/26/24  6:38 PM  Result Value Ref Range   aPTT >200 (HH) 24 - 36 seconds  Glucose, capillary     Status: Abnormal   Collection Time: 02/26/24  7:35 PM  Result Value Ref Range   Glucose-Capillary 111 (H) 70 - 99 mg/dL  APTT     Status: Abnormal   Collection Time: 02/26/24  8:32 PM  Result Value Ref Range   aPTT >200 (HH) 24 - 36 seconds  Glucose, capillary     Status: None   Collection Time: 02/26/24 11:30 PM  Result Value Ref Range   Glucose-Capillary 88 70 - 99 mg/dL  Lipase, blood     Status: Abnormal   Collection Time: 02/27/24  3:44 AM  Result Value Ref Range   Lipase 516 (H) 11 - 51 U/L  Comprehensive metabolic panel with GFR     Status: Abnormal   Collection Time: 02/27/24  3:44 AM  Result Value Ref Range   Sodium 142 135 - 145  mmol/L   Potassium 3.4 (L) 3.5 - 5.1 mmol/L   Chloride 112 (H) 98 - 111 mmol/L   CO2 24 22 - 32 mmol/L   Glucose, Bld 90 70 - 99 mg/dL   BUN 13 8 - 23 mg/dL   Creatinine, Ser 1.61 0.44 - 1.00 mg/dL   Calcium  8.4 (L) 8.9 - 10.3 mg/dL   Total Protein 4.1 (L) 6.5 - 8.1 g/dL   Albumin  2.1 (L) 3.5 - 5.0 g/dL   AST 84 (H) 15 - 41 U/L   ALT 36 0 - 44 U/L   Alkaline Phosphatase 47 38 - 126 U/L   Total Bilirubin 0.8 0.0 - 1.2 mg/dL   GFR, Estimated >09 >  60 mL/min   Anion gap 6 5 - 15  Magnesium      Status: Abnormal   Collection Time: 02/27/24  3:44 AM  Result Value Ref Range   Magnesium  1.1 (L) 1.7 - 2.4 mg/dL  Phosphorus     Status: Abnormal   Collection Time: 02/27/24  3:44 AM  Result Value Ref Range   Phosphorus 2.4 (L) 2.5 - 4.6 mg/dL  Heparin  level (unfractionated)     Status: None   Collection Time: 02/27/24  3:44 AM  Result Value Ref Range   Heparin  Unfractionated 0.37 0.30 - 0.70 IU/mL  Glucose, capillary     Status: None   Collection Time: 02/27/24  3:53 AM  Result Value Ref Range   Glucose-Capillary 86 70 - 99 mg/dL  Glucose, capillary     Status: None   Collection Time: 02/27/24  7:34 AM  Result Value Ref Range   Glucose-Capillary 92 70 - 99 mg/dL  Heparin  level (unfractionated)     Status: None   Collection Time: 02/27/24 10:48 AM  Result Value Ref Range   Heparin  Unfractionated 0.42 0.30 - 0.70 IU/mL  APTT     Status: Abnormal   Collection Time: 02/27/24 10:48 AM  Result Value Ref Range   aPTT 147 (H) 24 - 36 seconds  CBC     Status: Abnormal   Collection Time: 02/27/24 10:48 AM  Result Value Ref Range   WBC 5.1 4.0 - 10.5 K/uL   RBC 2.99 (L) 3.87 - 5.11 MIL/uL   Hemoglobin 9.8 (L) 12.0 - 15.0 g/dL   HCT 91.4 (L) 78.2 - 95.6 %   MCV 97.3 80.0 - 100.0 fL   MCH 32.8 26.0 - 34.0 pg   MCHC 33.7 30.0 - 36.0 g/dL   RDW 21.3 08.6 - 57.8 %   Platelets 104 (L) 150 - 400 K/uL   nRBC 0.0 0.0 - 0.2 %  Glucose, capillary     Status: None   Collection Time: 02/27/24  11:20 AM  Result Value Ref Range   Glucose-Capillary 81 70 - 99 mg/dL    I have reviewed pertinent nursing notes, vitals, labs, and images as necessary. I have ordered labwork to follow up on as indicated.  I have reviewed the last notes from staff over past 24 hours. I have discussed patient's care plan and test results with nursing staff, CM/SW, and other staff as appropriate.  Time spent: Greater than 50% of the 55 minute visit was spent in counseling/coordination of care for the patient as laid out in the A&P.   LOS: 1 day   Faith Homes, MD [facility] 02/27/2024, 1:12 PM

## 2024-02-27 NOTE — Assessment & Plan Note (Signed)
-   Continue SSI and CBG monitoring ?

## 2024-02-27 NOTE — Assessment & Plan Note (Addendum)
-   Patient presented with chest pain, abdominal pain, back pain -Etiology still unclear. -Lipase > 2,800; TG 100; denies etoh use; unclear if med changes but sounds like none; CT negative for biliary dilation; hx CCY - consulting GI for assistance with diet advancement and potential etiology and with ?duodenitis on CT - continue IVF for now as diet advances - If no plans for endoscopy procedures, we can transition heparin  drip back to Eliquis

## 2024-02-27 NOTE — Hospital Course (Signed)
 Ms. Darnold is an 89 year old female with PMH GERD, CAD s/p CABG, HTN, HLD, DM II, history of PE on chronic Eliquis , anemia who presented with chest pain, abdominal pain, back pain. She had elevated troponin with EKG changes and was taken for catheterization for further workup.  Heart cath showed no significant obstruction nor cardiac cause.  She also had developed worsening hypotension on admission warranting use of Levophed. Further workup had also shown elevated lipase, acute edematous pancreatitis with possible duodenitis. Urinalysis was negative for signs of infection.  Blood cultures showed 4/4 bottles with E. coli.

## 2024-02-27 NOTE — Assessment & Plan Note (Signed)
-   On chronic Eliquis  at home - Currently on heparin  drip - Continue drip for now until no further need for any procedures then okay to transition back to Eliquis

## 2024-02-27 NOTE — Progress Notes (Signed)
*  PRELIMINARY RESULTS* Echocardiogram 2D Echocardiogram has been performed.  Jennifer Warner 02/27/2024, 11:39 AM

## 2024-02-27 NOTE — Assessment & Plan Note (Signed)
-   Required pressors on admission - Continue holding BP meds for now

## 2024-02-28 ENCOUNTER — Inpatient Hospital Stay (HOSPITAL_COMMUNITY)

## 2024-02-28 DIAGNOSIS — R7881 Bacteremia: Secondary | ICD-10-CM | POA: Diagnosis not present

## 2024-02-28 DIAGNOSIS — K2980 Duodenitis without bleeding: Secondary | ICD-10-CM

## 2024-02-28 DIAGNOSIS — K859 Acute pancreatitis without necrosis or infection, unspecified: Secondary | ICD-10-CM | POA: Diagnosis not present

## 2024-02-28 DIAGNOSIS — B9620 Unspecified Escherichia coli [E. coli] as the cause of diseases classified elsewhere: Secondary | ICD-10-CM | POA: Diagnosis not present

## 2024-02-28 LAB — CBC
HCT: 29 % — ABNORMAL LOW (ref 36.0–46.0)
Hemoglobin: 9.6 g/dL — ABNORMAL LOW (ref 12.0–15.0)
MCH: 31.6 pg (ref 26.0–34.0)
MCHC: 33.1 g/dL (ref 30.0–36.0)
MCV: 95.4 fL (ref 80.0–100.0)
Platelets: 106 10*3/uL — ABNORMAL LOW (ref 150–400)
RBC: 3.04 MIL/uL — ABNORMAL LOW (ref 3.87–5.11)
RDW: 14.3 % (ref 11.5–15.5)
WBC: 3.6 10*3/uL — ABNORMAL LOW (ref 4.0–10.5)
nRBC: 0 % (ref 0.0–0.2)

## 2024-02-28 LAB — GLUCOSE, CAPILLARY
Glucose-Capillary: 112 mg/dL — ABNORMAL HIGH (ref 70–99)
Glucose-Capillary: 116 mg/dL — ABNORMAL HIGH (ref 70–99)
Glucose-Capillary: 121 mg/dL — ABNORMAL HIGH (ref 70–99)
Glucose-Capillary: 150 mg/dL — ABNORMAL HIGH (ref 70–99)
Glucose-Capillary: 92 mg/dL (ref 70–99)
Glucose-Capillary: 94 mg/dL (ref 70–99)
Glucose-Capillary: 99 mg/dL (ref 70–99)

## 2024-02-28 LAB — LIPASE, BLOOD: Lipase: 255 U/L — ABNORMAL HIGH (ref 11–51)

## 2024-02-28 LAB — COMPREHENSIVE METABOLIC PANEL WITH GFR
ALT: 35 U/L (ref 0–44)
AST: 66 U/L — ABNORMAL HIGH (ref 15–41)
Albumin: 2.4 g/dL — ABNORMAL LOW (ref 3.5–5.0)
Alkaline Phosphatase: 53 U/L (ref 38–126)
Anion gap: 5 (ref 5–15)
BUN: 7 mg/dL — ABNORMAL LOW (ref 8–23)
CO2: 25 mmol/L (ref 22–32)
Calcium: 8.8 mg/dL — ABNORMAL LOW (ref 8.9–10.3)
Chloride: 113 mmol/L — ABNORMAL HIGH (ref 98–111)
Creatinine, Ser: 0.64 mg/dL (ref 0.44–1.00)
GFR, Estimated: >60 mL/min (ref >60)
Glucose, Bld: 94 mg/dL (ref 70–99)
Potassium: 3.8 mmol/L (ref 3.5–5.1)
Sodium: 143 mmol/L (ref 135–145)
Total Bilirubin: 0.6 mg/dL (ref 0.0–1.2)
Total Protein: 4.7 g/dL — ABNORMAL LOW (ref 6.5–8.1)

## 2024-02-28 LAB — HEPARIN LEVEL (UNFRACTIONATED): Heparin Unfractionated: 0.17 [IU]/mL — ABNORMAL LOW (ref 0.30–0.70)

## 2024-02-28 LAB — MAGNESIUM: Magnesium: 1.9 mg/dL (ref 1.7–2.4)

## 2024-02-28 LAB — APTT: aPTT: 68 s — ABNORMAL HIGH (ref 24–36)

## 2024-02-28 LAB — LIPOPROTEIN A (LPA): Lipoprotein (a): 14.5 nmol/L (ref <75.0)

## 2024-02-28 LAB — PHOSPHORUS: Phosphorus: 2.9 mg/dL (ref 2.5–4.6)

## 2024-02-28 MED ORDER — NALOXONE HCL 0.4 MG/ML IJ SOLN: 0.4000 mg | INTRAMUSCULAR | Status: DC | PRN

## 2024-02-28 MED ORDER — APIXABAN 2.5 MG PO TABS
2.5000 mg | ORAL_TABLET | Freq: Two times a day (BID) | ORAL | Status: DC
Start: 2024-02-28 — End: 2024-03-01
  Administered 2024-02-28 – 2024-03-01 (×5): 2.5 mg via ORAL
  Filled 2024-02-28 (×5): qty 1

## 2024-02-28 MED ORDER — OXYCODONE HCL 5 MG PO TABS
5.0000 mg | ORAL_TABLET | Freq: Four times a day (QID) | ORAL | Status: DC | PRN
  Administered 2024-02-28: 5 mg via ORAL
  Filled 2024-02-28: qty 1

## 2024-02-28 MED ORDER — LIDOCAINE 5 % EX PTCH
1.0000 | MEDICATED_PATCH | Freq: Every day | CUTANEOUS | Status: DC | PRN
  Administered 2024-02-28: 1 via TRANSDERMAL
  Filled 2024-02-28 (×2): qty 1

## 2024-02-28 MED ORDER — LIDOCAINE 5 % EX PTCH: 1.0000 | MEDICATED_PATCH | Freq: Every day | CUTANEOUS | Status: DC | PRN

## 2024-02-28 NOTE — Progress Notes (Signed)
 Physical Therapy Treatment Patient Details Name: Jennifer Warner MRN: [id number] DOB: 1933-11-12 Today's Date: 02/28/2024   History of Present Illness 89yo female presents to [facility] 02/26/24 with chest pain, abdominal/back pain with code STEMI activated. Found to be hypotensive with acute edematous pancreatiis with possible duodenitis and septic shock w/ E.coli bacteremia. PMH: h/o CAD s/p cabg 2009, HTN, HLD, DMII with hyperglycemia, PE on chronic a/c, sciatica   PT Comments  Pt in bed upon arrival and agreeable to PT session. Pt was able to stand with CGA and RW to don/doff gown and underwear. Pt was able to slightly lift one LE off the ground with increased time and effort. Limited gait distance this session due to pain in L hip with pt able to ambulate 20 ft to recliner. Pt reported no pain with L sciatic nerve testing. Educated pt on sciatic nerve glide exercise in seated position in attempts to alleviate pain. Pt reported a decrease in pain with heated pad underneath L hip at end of session. Pt is progressing well towards goals. Continue to recommend HHPT upon return home. Acute PT to follow.      If plan is discharge home, recommend the following: A little help with walking and/or transfers;A little help with bathing/dressing/bathroom;Help with stairs or ramp for entrance;Assist for transportation   Can travel by private vehicle      Yes  Equipment Recommendations  None recommended by PT       Precautions / Restrictions Precautions Precautions: Fall Restrictions Weight Bearing Restrictions Per Provider Order: No     Mobility  Bed Mobility Overal bed mobility: Modified Independent   General bed mobility comments: increased time with HOB elevated. Able to scoot forwards with no assist needed    Transfers Overall transfer level: Needs assistance Equipment used: Rolling walker (2 wheels) Transfers: Sit to/from Stand Sit to Stand: Supervision    General transfer comment:  supervision for safety, increased time    Ambulation/Gait Ambulation/Gait assistance: Contact guard assist Gait Distance (Feet): 20 Feet Assistive device: Rolling walker (2 wheels) Gait Pattern/deviations: Step-through pattern, Decreased stride length, Antalgic, Decreased weight shift to left, Decreased stance time - left Gait velocity: dec     General Gait Details: antalgic gait pattern with decreased WB on LLE. Pt limited gait distance due to pain     Balance Overall balance assessment: Needs assistance Sitting-balance support: Feet supported, No upper extremity supported Sitting balance-Leahy Scale: Fair     Standing balance support: Bilateral upper extremity supported, Reliant on assistive device for balance Standing balance-Leahy Scale: Poor Standing balance comment: reliant on RW     Communication Communication Communication: No apparent difficulties  Cognition Arousal: Alert Behavior During Therapy: WFL for tasks assessed/performed   PT - Cognitive impairments: No family/caregiver present to determine baseline    Following commands: Intact      Cueing Cueing Techniques: Verbal cues  Exercises General Exercises - Lower Extremity Ankle Circles/Pumps: AROM, Both, 10 reps, Supine Straight Leg Raises: AROM, Both, 10 reps, Supine Hip Flexion/Marching: AROM, Both, 5 reps, Seated Other Exercises Other Exercises: L LE sciatic nerve glide in sitting, x5 reps    General Comments General comments (skin integrity, edema, etc.): Changed gown, mesh underwear, and pad while in standing. Able to lift one foot up at a time with B UE support.      Pertinent Vitals/Pain Pain Assessment Pain Assessment: Faces Faces Pain Scale: Hurts even more Pain Location: L hip Pain Descriptors / Indicators: Discomfort, Grimacing Pain Intervention(s): Limited activity  within patient's tolerance, Monitored during session, Repositioned, Heat applied     PT Goals (current goals can now be  found in the care plan section) Acute Rehab PT Goals PT Goal Formulation: With patient Time For Goal Achievement: 03/12/24 Potential to Achieve Goals: Good Progress towards PT goals: Progressing toward goals    Frequency    Min 2X/week          AM-PAC PT "6 Clicks" Mobility   Outcome Measure  Help needed turning from your back to your side while in a flat bed without using bedrails?: A Little Help needed moving from lying on your back to sitting on the side of a flat bed without using bedrails?: A Little Help needed moving to and from a bed to a chair (including a wheelchair)?: A Little Help needed standing up from a chair using your arms (e.g., wheelchair or bedside chair)?: A Little Help needed to walk in hospital room?: A Little Help needed climbing 3-5 steps with a railing? : A Little 6 Click Score: 18    End of Session Equipment Utilized During Treatment: Gait belt Activity Tolerance: Patient tolerated treatment well Patient left: in chair;with call bell/phone within reach Nurse Communication: Mobility status (RN present during beginning of session) PT Visit Diagnosis: Unsteadiness on feet (R26.81);Muscle weakness (generalized) (M62.81)     Time: 4098-1191 PT Time Calculation (min) (ACUTE ONLY): 30 min  Charges:    $Gait Training: 8-22 mins $Therapeutic Exercise: 8-22 mins PT General Charges $$ ACUTE PT VISIT: 1 Visit                     Orysia Blas, PT, DPT Secure Chat Preferred  Rehab Office [phone]    [name] 02/28/2024, 3:41 PM

## 2024-02-28 NOTE — Progress Notes (Signed)
 [facility] night cross cover note:   I was notified by the patient's RN the patient complaining of left hip discomfort radiating into the left leg and that her previous acetaminophen  was discontinued in the setting of initially elevated liver enzymes.  Additionally, she conveys a history of adverse reaction to gabapentin.  I subsequently placed orders for oxycodone  5 mg p.o. every 6 hours as needed as well as prn lidocaine  patch to the affected area.     Camelia Cavalier, DO Hospitalist

## 2024-02-28 NOTE — Progress Notes (Signed)
 Patient asking for pain medication for sciatic pain left hip pain that shoots down leg, pt states pain when lying down is 6/10 dull aching pain that is constant. She states that if she tries to stand the pain is worse and shoots down her leg. Tylenol  was d/c'd today d/t pancreatitis nothing else ordered for pain. Patient states she had a "bad reaction with gabapentin and had to stop taking it".  Night MD notified and ordered oxycodone  5mg  and lidocaine  patch. These were given to patient, she is very grateful and when RN returned to check on patient to see if pain interventions were helpful she states "Yes, I'm so thankful to get some pain relief". Patient also needed to use bedside toilet and noted that her pain was significantly better when standing.

## 2024-02-28 NOTE — Progress Notes (Signed)
 PT Cancellation Note  Patient Details Name: Jennifer Warner MRN: [id number] DOB: 04-18-1934   Cancelled Treatment:    Reason Eval/Treat Not Completed: Patient at procedure or test/unavailable (Pt currently off unit for imaging. Acute PT to re-attempt as able.)  [name], PT, DPT Secure Chat Preferred  Rehab Office [phone]   [name] 02/28/2024, 10:23 AM

## 2024-02-28 NOTE — Progress Notes (Signed)
 Progress Note   Patient: Jennifer Warner RUE:[id number] DOB: July 23, 1934 DOA: 02/26/2024     2 DOS: the patient was seen and examined on 02/28/2024   Brief hospital course:  89 year old female with PMH GERD, CAD s/p CABG, HTN, HLD, DM II, history of PE on chronic Eliquis , anemia who presented with chest pain, abdominal pain, back pain. She had elevated troponin with EKG changes and was taken for catheterization for further workup.  Heart cath showed no significant obstruction nor cardiac cause.  She also had developed worsening hypotension on admission warranting use of Levophed. Further workup had also shown elevated lipase, acute edematous pancreatitis with possible duodenitis. Urinalysis was negative for signs of infection.  Blood cultures showed 4/4 bottles with E. coli.  Assessment and Plan:  Septic shock with E. coli bacteremia - On presentation, tachycardia, neutropenia, hypotension requiring Levophed use.  Subsequently weaned off Levophed.  Sepsis appears resolved.  E. coli bacteremia 4/4 bottles positive on admission.  Source unclear, presuming GI source given underlying pancreatitis.  Continues on empiric Rocephin .  Acute pancreatitis - Etiology unclear.  History of cholecystectomy, denies alcohol.  CT negative for biliary dilation.  Showed improvement with aggressive IV fluid hydration.  GI consulted and following closely.  No plans for endoscopic procedures.  Transition from heparin  drip back to Eliquis .  Advance diet as tolerated.  Right upper quadrant ultrasound pending.  Demand ischemia - Status post cath 5/12 which was negative for disease.  Echo 5/13 noting EF 65%, grade 1 DD.  Diabetes mellitus To insulin  sliding scale on board.  Hypokalemia/hypomagnesemia/hypophosphatemia - Replenish as needed.  Hypertension - Holding BP meds secondary to initial hypotension/shock.  History of PE - Resume Eliquis .  Physical debilitation and muscle weakness - Evaluated by PT/OT,  recommending home health.  Anticipate discharge 1-2 days.    Subjective: Patient sitting up at bedside, feeling well this morning.  Able to tolerate her liquid diet, and not yet at her solid food breakfast this morning.  Denies any fever, chills, chest pain, shortness of breath, nausea, vomiting, abdominal pain.  Physical Exam: Vitals:   02/27/24 2314 02/28/24 0334 02/28/24 0500 02/28/24 0729  BP: (!) 149/73 127/77  129/89  Pulse: 98 88 91 96  Resp: 20 20 19 18   Temp: 98.7 F (37.1 C) 98.6 F (37 C)  98 F (36.7 C)  TempSrc: Oral Oral  Oral  SpO2: 95% 96% 96% 97%  Weight:   50.7 kg   Height:        GENERAL:  Alert, pleasant, no acute distress  HEENT:  EOMI CARDIOVASCULAR: Irregularly irregular, no murmurs appreciated RESPIRATORY:  Clear to auscultation, no wheezing, rales, or rhonchi GASTROINTESTINAL:  Soft, nontender, nondistended EXTREMITIES:  No LE edema bilaterally NEURO:  No new focal deficits appreciated SKIN:  No rashes noted PSYCH:  Appropriate mood and affect     Data Reviewed:  No new imaging to review.  Labs: CBC: Recent Labs  Lab 02/26/24 0212 02/26/24 0219 02/27/24 1048 02/28/24 0232  WBC 2.8*  --  5.1 3.6*  NEUTROABS 2.5  --   --   --   HGB 10.8* 10.9* 9.8* 9.6*  HCT 32.9* 32.0* 29.1* 29.0*  MCV 98.2  --  97.3 95.4  PLT 155  --  104* 106*   Basic Metabolic Panel: Recent Labs  Lab 02/26/24 0212 02/26/24 0219 02/27/24 0344 02/28/24 0232  NA 140 140 142 143  K 3.8 3.8 3.4* 3.8  CL 107 106 112* 113*  CO2 21*  --  24 25  GLUCOSE 100* 95 90 94  BUN 19 20 13  7*  CREATININE 0.99 1.00 0.80 0.64  CALCIUM  10.0  --  8.4* 8.8*  MG  --   --  1.1* 1.9  PHOS  --   --  2.4* 2.9   Liver Function Tests: Recent Labs  Lab 02/26/24 0212 02/27/24 0344 02/28/24 0232  AST 160* 84* 66*  ALT 39 36 35  ALKPHOS 78 47 53  BILITOT 1.7* 0.8 0.6  PROT 5.6* 4.1* 4.7*  ALBUMIN  3.1* 2.1* 2.4*   CBG: Recent Labs  Lab 02/27/24 1955 02/27/24 2328  02/28/24 0332 02/28/24 0731 02/28/24 1240  GLUCAP 89 92 92 94 99    Scheduled Meds:  apixaban   2.5 mg Oral BID   feeding supplement  1 Container Oral TID BM   insulin  aspart  0-9 Units Subcutaneous Q4H   pantoprazole   40 mg Oral Daily   sodium chloride  flush  3 mL Intravenous Q12H   Continuous Infusions:  cefTRIAXone  (ROCEPHIN )  IV 2 g (02/27/24 2134)   PRN Meds:.docusate sodium, ondansetron  (ZOFRAN ) IV, mouth rinse, polyethylene glycol, sodium chloride  flush  Family Communication: None at bedside  Disposition: Status is: Inpatient Remains inpatient appropriate because: Pancreatitis     Time spent: 33 minutes  Author: [name], DO 02/28/2024 1:20 PM  For on call review [email or web address].

## 2024-02-28 NOTE — Hospital Course (Signed)
 89 year old female with PMH GERD, CAD s/p CABG, HTN, HLD, DM II, history of PE on chronic Eliquis , anemia who presented with chest pain, abdominal pain, back pain. She had elevated troponin with EKG changes and was taken for catheterization for further workup.  Heart cath showed no significant obstruction nor cardiac cause.  She also had developed worsening hypotension on admission warranting use of Levophed. Further workup had also shown elevated lipase, acute edematous pancreatitis with possible duodenitis. Urinalysis was negative for signs of infection.  Blood cultures showed 4/4 bottles with E. coli.   Assessment and Plan:   Septic shock with E. coli bacteremia - On presentation, tachycardia, neutropenia, hypotension requiring Levophed use.  Subsequently weaned off Levophed.  Sepsis appears resolved.  E. coli bacteremia 4/4 bottles positive on admission.  Source unclear, presuming GI source given underlying pancreatitis.  Continues on empiric Rocephin .   Acute pancreatitis - Etiology unclear.  History of cholecystectomy, denies alcohol.  CT negative for biliary dilation.  Showed improvement with aggressive IV fluid hydration.  GI consulted and following closely.  No plans for endoscopic procedures.  Transition from heparin  drip back to Eliquis .  Advance diet as tolerated.  Right upper quadrant ultrasound pending.   Demand ischemia - Status post cath 5/12 which was negative for disease.  Echo 5/13 noting EF 65%, grade 1 DD.   Diabetes mellitus To insulin  sliding scale on board.   Hypokalemia/hypomagnesemia/hypophosphatemia - Replenish as needed.   Hypertension - Holding BP meds secondary to initial hypotension/shock.   History of PE - Resume Eliquis .   Physical debilitation and muscle weakness - Evaluated by PT/OT, recommending home health.  Anticipate discharge 1-2 days.

## 2024-02-28 NOTE — Progress Notes (Signed)
 OT Cancellation Note  Patient Details Name: ORIANE WIELGUS MRN: [id number] DOB: 1934/01/30   Cancelled Treatment:    Reason Eval/Treat Not Completed: Patient at procedure or test/ unavailable (Pt currently off unit for imaging. OT to reattempt to see pt for acute OT eval at a later time as appropriate/available.)  Ran Tullis "Jenine Mix" M., OTR/L, MA Acute Rehab [phone]   Walt Gunner 02/28/2024, 9:53 AM

## 2024-02-28 NOTE — Plan of Care (Signed)

## 2024-02-28 NOTE — Progress Notes (Signed)
 Progress Note   Subjective  Patient continues to feel better, pain seems controlled. No vomiting, tolerating clears.    Objective   Vital signs in last 24 hours: Temp:  [97.5 F (36.4 C)-98.7 F (37.1 C)] 98.6 F (37 C) (05/14 0334) Pulse Rate:  [79-107] 91 (05/14 0500) Resp:  [15-25] 19 (05/14 0500) BP: (112-165)/(52-77) 127/77 (05/14 0334) SpO2:  [93 %-98 %] 96 % (05/14 0500) Weight:  [50.7 kg] 50.7 kg (05/14 0500) Last BM Date : 02/28/24 General:    white female in NAD Neurologic:  Alert and oriented,  grossly normal neurologically. Psych:  Cooperative. Normal mood and affect.  Intake/Output from previous day: 05/13 0701 - 05/14 0700 In: 2253.6 [P.O.:240; I.V.:1323.6; IV Piggyback:689.9] Out: 2300 [Urine:2300] Intake/Output this shift: No intake/output data recorded.  Lab Results: Recent Labs    02/26/24 0212 02/26/24 0219 02/27/24 1048 02/28/24 0232  WBC 2.8*  --  5.1 3.6*  HGB 10.8* 10.9* 9.8* 9.6*  HCT 32.9* 32.0* 29.1* 29.0*  PLT 155  --  104* 106*   BMET Recent Labs    02/26/24 0212 02/26/24 0219 02/27/24 0344 02/28/24 0232  NA 140 140 142 143  K 3.8 3.8 3.4* 3.8  CL 107 106 112* 113*  CO2 21*  --  24 25  GLUCOSE 100* 95 90 94  BUN 19 20 13  7*  CREATININE 0.99 1.00 0.80 0.64  CALCIUM  10.0  --  8.4* 8.8*   LFT Recent Labs    02/28/24 0232  PROT 4.7*  ALBUMIN  2.4*  AST 66*  ALT 35  ALKPHOS 53  BILITOT 0.6   PT/INR Recent Labs    02/26/24 0212  LABPROT 14.9  INR 1.2    Studies/Results: ECHOCARDIOGRAM COMPLETE Result Date: 02/27/2024    ECHOCARDIOGRAM REPORT   Patient Name:   MAIS MARBAN Date of Exam: 02/27/2024 Medical Rec #:  [id number]       Height:       63.0 in Accession #:    [id number]      Weight:       114.4 lb Date of Birth:  Oct 10, 1934       BSA:          1.525 m Patient Age:    89 years        BP:           119/73 mmHg Patient Gender: F               HR:           77 bpm. Exam Location:  Inpatient Procedure: 2D  Echo, Cardiac Doppler and Color Doppler (Both Spectral and Color            Flow Doppler were utilized during procedure). Indications:    Bacteremia  History:        Patient has prior history of Echocardiogram examinations, most                 recent 05/19/2023.  Sonographer:    Andrena Bang Referring Phys: [phone] Ky Phillips HOFFMAN IMPRESSIONS  1. Left ventricular ejection fraction, by estimation, is 65%. The left ventricle has normal function. The left ventricle has no regional wall motion abnormalities. There is mild asymmetric left ventricular hypertrophy of the basal-septal segment. Left ventricular diastolic parameters are consistent with Grade I diastolic dysfunction (impaired relaxation).  2. Right ventricular systolic function is normal. The right ventricular size is normal. There is normal pulmonary artery systolic pressure.  3. The mitral valve is normal in structure. No evidence of mitral valve regurgitation. No evidence of mitral stenosis.  4. The aortic valve is tricuspid. There is mild calcification of the aortic valve. Aortic valve regurgitation is not visualized. No aortic stenosis is present.  5. The inferior vena cava is normal in size with greater than 50% respiratory variability, suggesting right atrial pressure of 3 mmHg. Comparison(s): No significant change from prior study. Conclusion(s)/Recommendation(s): No evidence of valvular vegetations on this transthoracic echocardiogram. Consider a transesophageal echocardiogram to exclude infective endocarditis if clinically indicated. FINDINGS  Left Ventricle: Left ventricular ejection fraction, by estimation, is 65%. The left ventricle has normal function. The left ventricle has no regional wall motion abnormalities. Strain was performed and the global longitudinal strain is indeterminate. The left ventricular internal cavity size was normal in size. There is mild asymmetric left ventricular hypertrophy of the basal-septal segment. Left ventricular  diastolic parameters are consistent with Grade I diastolic dysfunction (impaired relaxation). Right Ventricle: The right ventricular size is normal. No increase in right ventricular wall thickness. Right ventricular systolic function is normal. There is normal pulmonary artery systolic pressure. The tricuspid regurgitant velocity is 2.12 m/s, and  with an assumed right atrial pressure of 3 mmHg, the estimated right ventricular systolic pressure is 21.0 mmHg. Left Atrium: Left atrial size was normal in size. Right Atrium: Right atrial size was normal in size. Pericardium: There is no evidence of pericardial effusion. Mitral Valve: The mitral valve is normal in structure. No evidence of mitral valve regurgitation. No evidence of mitral valve stenosis. Tricuspid Valve: The tricuspid valve is normal in structure. Tricuspid valve regurgitation is mild . No evidence of tricuspid stenosis. Aortic Valve: The aortic valve is tricuspid. There is mild calcification of the aortic valve. Aortic valve regurgitation is not visualized. No aortic stenosis is present. Aortic valve mean gradient measures 2.0 mmHg. Aortic valve peak gradient measures 4.6 mmHg. Aortic valve area, by VTI measures 2.49 cm. Pulmonic Valve: The pulmonic valve was normal in structure. Pulmonic valve regurgitation is trivial. No evidence of pulmonic stenosis. Aorta: The aortic root and ascending aorta are structurally normal, with no evidence of dilitation. Venous: The inferior vena cava is normal in size with greater than 50% respiratory variability, suggesting right atrial pressure of 3 mmHg. IAS/Shunts: The interatrial septum appears to be lipomatous. The interatrial septum was not well visualized. Additional Comments: 3D was performed not requiring image post processing on an independent workstation and was indeterminate.  LEFT VENTRICLE PLAX 2D LVIDd:         3.90 cm     Diastology LVIDs:         2.40 cm     LV e' medial:    5.87 cm/s LV PW:          1.20 cm     LV E/e' medial:  15.4 LV IVS:        0.90 cm     LV e' lateral:   7.40 cm/s LVOT diam:     1.80 cm     LV E/e' lateral: 12.2 LV SV:         51 LV SV Index:   33 LVOT Area:     2.54 cm  LV Volumes (MOD) LV vol d, MOD A2C: 63.3 ml LV vol d, MOD A4C: 78.5 ml LV vol s, MOD A2C: 22.3 ml LV vol s, MOD A4C: 27.4 ml LV SV MOD A2C:     41.0 ml LV SV MOD  A4C:     78.5 ml LV SV MOD BP:      45.9 ml RIGHT VENTRICLE RV S prime:     11.90 cm/s TAPSE (M-mode): 1.4 cm LEFT ATRIUM             Index LA diam:        3.70 cm 2.43 cm/m LA Vol (A2C):   54.6 ml 35.80 ml/m LA Vol (A4C):   16.2 ml 10.62 ml/m LA Biplane Vol: 32.2 ml 21.11 ml/m  AORTIC VALVE AV Area (Vmax):    2.19 cm AV Area (Vmean):   2.05 cm AV Area (VTI):     2.49 cm AV Vmax:           107.00 cm/s AV Vmean:          67.900 cm/s AV VTI:            0.204 m AV Peak Grad:      4.6 mmHg AV Mean Grad:      2.0 mmHg LVOT Vmax:         92.00 cm/s LVOT Vmean:        54.700 cm/s LVOT VTI:          0.200 m LVOT/AV VTI ratio: 0.98  AORTA Ao Asc diam: 3.00 cm MITRAL VALVE                TRICUSPID VALVE MV Area (PHT): 3.79 cm     TR Peak grad:   18.0 mmHg MV Decel Time: 200 msec     TR Vmax:        212.00 cm/s MV E velocity: 90.30 cm/s MV A velocity: 112.00 cm/s  SHUNTS MV E/A ratio:  0.81         Systemic VTI:  0.20 m                             Systemic Diam: 1.80 cm Gloriann Larger MD Electronically signed by Gloriann Larger MD Signature Date/Time: 02/27/2024/11:49:39 AM    Final        Assessment / Plan:    89 y/o female here with the following:  Acute pancreatitis Duodenitis E coli bactermia Demand ischemia  She is doing better in regards to her pancreatitis, I think can advance to low fat diet today. Labs stable.  E coli bacteremia obviously concerning, unclear source. Biliary pancreatitis could certainly be a cause of that however her ducts were not dilated on imaging, she is s/p cholecystectomy, and had a very minimal elevation in  AST. Possible she passed a stone / sludge but again labs / imaging not too impressive for that. We can do a follow RUQ US  but likely low yield, doubt any retained stones. Duodenitis likely secondary to pancreatitis and continue PPI. Can consider outpatient EGD but may just follow this up with imaging. Once through this acute course recommend MRCP as outpatient to further evaluate her pancreas, rule out small mass lesion, etc. Otherwise, she has taken a lot of tylenol  for hip pain preceding this admission and that can be related to pancreatitis (class II drug), would hold that for now.   No invasive procedures planned from our standpoint right now, can resume Eliquis  if needed. Defer duration of antibiotics to primary team for her bacteremia. She is getting better. She states upon discharge she plans on moving in with her sister in [place]  as long term plan but does wish to keep her medical  care in [place] and I am happy to see her back upon discharge.  PLAN: - low fat diet okay as tolerated - RUQ US  - continue PPI - stop tylenol  - will need imaging as outpatient in several weeks (MRCP) - consideration for EGD pending her course as outpatient - antibiotics per primary service - okay to resume Eliquis  - can coordinate outpatient follow up with us  upon discharge  Call with questions.  Christi Coward, MD [facility]

## 2024-02-28 NOTE — TOC Progression Note (Signed)
 Transition of Care [facility]) - Progression Note    Patient Details  Name: Jennifer Warner MRN: [id number] Date of Birth: 06-21-1934  [facility] CM/SW Contact  Jeani Mill, RN Phone Number: 02/28/2024, 4:01 PM  Clinical Narrative:    Spoke to patient regarding transition needs.  Patient has all needed DME and transport at discharge.  Patient plans to move to [place] to lives with her sister.  Address: [address] [place],, [place] [address] Patient is agreeable to home health.  This RNCM offered choice for Home  Health, Patient states she has no preference,RNCM spoke to Douglas with [facility]. Bartholomew Light will notify Bernardo Bridgeman in the morning to notify if [facility] can accept.    Expected Discharge Plan: Home w Home Health Services Barriers to Discharge: Continued Medical Work up  Expected Discharge Plan and Services   Discharge Planning Services: CM Consult Post Acute Care Choice: Home Health Living arrangements for the past 2 months: Mobile Home                                       Social Determinants of Health (SDOH) Interventions SDOH Screenings   Food Insecurity: No Food Insecurity (02/26/2024)  Housing: Low Risk  (02/26/2024)  Transportation Needs: No Transportation Needs (02/26/2024)  Utilities: Not At Risk (02/26/2024)  Financial Resource Strain: Low Risk  (02/21/2024)   Received from [facility]  Social Connections: Moderately Integrated (02/26/2024)  Tobacco Use: Low Risk  (02/21/2024)   Received from [facility]    Readmission Risk Interventions     No data to display

## 2024-02-28 NOTE — Progress Notes (Signed)
 PHARMACY - ANTICOAGULATION CONSULT NOTE  Pharmacy Consult for heparin  Indication: h/o PE  Allergies  Allergen Reactions   Levofloxacin  Nausea And Vomiting    Patient Measurements: Height: 5\' 3"  (160 cm) Weight: 50.7 kg (111 lb 12.4 oz) IBW/kg (Calculated) : 52.4 HEPARIN  DW (KG): 51.3  Vital Signs: Temp: 98 F (36.7 C) (05/14 0729) Temp Source: Oral (05/14 0729) BP: 129/89 (05/14 0729) Pulse Rate: 96 (05/14 0729)  Labs: Recent Labs    02/26/24 0212 02/26/24 0219 02/26/24 0401 02/26/24 1629 02/27/24 0344 02/27/24 1048 02/27/24 2155 02/28/24 0232  HGB 10.8* 10.9*  --   --   --  9.8*  --  9.6*  HCT 32.9* 32.0*  --   --   --  29.1*  --  29.0*  PLT 155  --   --   --   --  104*  --  106*  APTT 27  --   --    < >  --  147* 73* 68*  LABPROT 14.9  --   --   --   --   --   --   --   INR 1.2  --   --   --   --   --   --   --   HEPARINUNFRC  --   --   --   --  0.37 0.42  --  0.17*  CREATININE 0.99 1.00  --   --  0.80  --   --  0.64  TROPONINIHS 20*  --  57*  --   --   --   --   --    < > = values in this interval not displayed.    Estimated Creatinine Clearance: 39.7 mL/min (by C-G formula based on SCr of 0.64 mg/dL).   Medical History: Past Medical History:  Diagnosis Date   Acute thyroiditis    CAD (coronary artery disease)    CAP (community acquired pneumonia) 12/2012   Essential hypertension, benign    GERD (gastroesophageal reflux disease)    History of blood transfusion 05/2008   "when I had my bypass"   Hyperkalemia    Hypertriglyceridemia    Other secondary thrombocytopenia    Polycystic ovaries    Postsurgical aortocoronary bypass status    Pure hypercholesterolemia    Type II diabetes mellitus (HCC)    Urinary tract infection, site not specified     Medications:  Medications Prior to Admission  Medication Sig Dispense Refill Last Dose/Taking   amLODipine  (NORVASC ) 5 MG tablet Take 1 tablet (5 mg total) by mouth daily. 90 tablet 3 Past Week    apixaban  (ELIQUIS ) 2.5 MG TABS tablet Take 1 tablet by mouth twice daily 60 tablet 5 02/25/2024 at 10:00 AM   atorvastatin  (LIPITOR) 20 MG tablet Take 1 tablet by mouth once daily 90 tablet 3 Past Week   diphenhydramine -acetaminophen  (TYLENOL  PM) 25-500 MG TABS Take 2 tablets by mouth at bedtime as needed (Sleep/Pain).   Unknown   fenofibrate  (TRICOR ) 145 MG tablet Take 1 tablet (145 mg total) by mouth every evening. 90 tablet 3 Past Week   LANTUS  SOLOSTAR 100 UNIT/ML injection Inject 12 Units into the skin at bedtime.   Past Week   losartan  (COZAAR ) 25 MG tablet Take 25 mg by mouth daily.   Past Week   metoprolol  tartrate (LOPRESSOR ) 25 MG tablet Take 1 tablet (25 mg total) by mouth 2 (two) times daily. 180 tablet 2 02/25/2024   pantoprazole  (PROTONIX ) 40 MG tablet  Take 40 mg by mouth every morning.   Past Week   Vitamin D, Ergocalciferol, 50 MCG (2000 UT) CAPS Take 2,000 Units by mouth daily.  1 Past Week   gabapentin (NEURONTIN) 300 MG capsule Take 300 mg by mouth at bedtime. (Patient not taking: Reported on 02/26/2024)   Not Taking   ibandronate (BONIVA) 150 MG tablet Take 150 mg by mouth every 30 (thirty) days. (Patient not taking: Reported on 02/26/2024)   Not Taking   Scheduled:   feeding supplement  1 Container Oral TID BM   insulin  aspart  0-9 Units Subcutaneous Q4H   pantoprazole   40 mg Oral Daily   sodium chloride  flush  3 mL Intravenous Q12H   Infusions:   cefTRIAXone  (ROCEPHIN )  IV 2 g (02/27/24 2134)   heparin  500 Units/hr (02/27/24 2129)    Assessment: 89yo female was sent emergently to cath lab for angiography, now to transition from apixaban  (last dose 5/11 am) for h/o PE in 2018 to heparin .  aPTT is therapeutic at 68 but heparin  level is subtherapeutic at 0.17, on heparin  infusion at 500 units/hr. Hgb 9.6, plt 106. No s/sx of bleeding per RN (some light bleeding at skin tear on bottom but stable). GI okay with restart apixaban  - on apixaban  2.5 mg PTA.   Goal of Therapy:   Heparin  level 0.3-0.7 units/ml aPTT 66-102 seconds Monitor platelets by anticoagulation protocol: Yes   Plan:  Stop heparin  infusion Start apixaban  2.5 mg twice daily Monitor CBC and for s/sx of bleeding  Thank you for allowing pharmacy to participate in this patient's care,  Nieves Bars, PharmD, BCCCP Clinical Pharmacist  Phone: [phone] 02/28/2024 10:22 AM  Please check AMION for all [facility] Pharmacy phone numbers After 10:00 PM, call Main Pharmacy [phone]

## 2024-02-28 NOTE — Evaluation (Signed)
 Occupational Therapy Evaluation Patient Details Name: Jennifer Warner MRN: [id number] DOB: April 30, 1934 Today's Date: 02/28/2024   History of Present Illness   89yo female presents to [facility] 02/26/24 with chest pain, abdominal/back pain with code STEMI activated. Found to be hypotensive with acute edematous pancreatiis with possible duodenitis and septic shock w/ E.coli bacteremia. PMH: h/o CAD s/p cabg 2009, HTN, HLD, DMII with hyperglycemia, PE on chronic a/c, sciatica     Clinical Impressions At baseline pt is Independent with ADLs and IADLs and completes functional mobility Mod I with a RW. Pt now presents with decreased activity tolerance, decreased balance, pain in Left hip affecting functional level, generalized B UE weakness, and decreased safety and independence with functional tasks. Pt currently demonstrates ability to complete ADLs Independent to Contact guard assist and functional transfers with a RW with Supervision to Contact guard assist. Pt VSS on RA throughout session. Pt will benefit from acute skilled OT services to address deficits outlined below and increase safety and independence with functional tasks. No post acute skilled OT needs are anticipated at this time.      If plan is discharge home, recommend the following:   A little help with walking and/or transfers;A little help with bathing/dressing/bathroom;Assistance with cooking/housework;Direct supervision/assist for medications management;Assist for transportation;Help with stairs or ramp for entrance     Functional Status Assessment   Patient has had a recent decline in their functional status and demonstrates the ability to make significant improvements in function in a reasonable and predictable amount of time.     Equipment Recommendations   BSC/3in1     Recommendations for Other Services         Precautions/Restrictions   Precautions Precautions: Fall Restrictions Weight Bearing Restrictions Per  Provider Order: No     Mobility Bed Mobility Overal bed mobility: Modified Independent             General bed mobility comments: increased time with HOB elevated. Able to scoot forwards with no assist needed    Transfers Overall transfer level: Needs assistance Equipment used: Rolling walker (2 wheels) Transfers: Sit to/from Stand, Bed to chair/wheelchair/BSC Sit to Stand: Supervision     Step pivot transfers: Contact guard assist     General transfer comment: Supervision to CGA for safety; cues for hand placement/technique      Balance Overall balance assessment: Needs assistance Sitting-balance support: No upper extremity supported, Feet supported Sitting balance-Leahy Scale: Fair     Standing balance support: Single extremity supported, Bilateral upper extremity supported, During functional activity, Reliant on assistive device for balance Standing balance-Leahy Scale: Poor Standing balance comment: reliant on UE support                           ADL either performed or assessed with clinical judgement   ADL Overall ADL's : Needs assistance/impaired Eating/Feeding: Independent;Sitting   Grooming: Contact guard assist;Standing   Upper Body Bathing: Set up;Supervision/ safety;Sitting   Lower Body Bathing: Supervison/ safety;Contact guard assist;Sit to/from stand   Upper Body Dressing : Contact guard assist;Sitting Upper Body Dressing Details (indicate cue type and reason): assist with managing telemetry cords Lower Body Dressing: Supervision/safety;Contact guard assist;Sit to/from stand   Toilet Transfer: Contact guard assist;Rolling walker (2 wheels);BSC/3in1 (step-pivot)   Toileting- Clothing Manipulation and Hygiene: Supervision/safety;Sit to/from stand         General ADL Comments: ADLs simulated at EOB. Pt with decreased activity tolerance and with L hip pain affecting  functional level.     Vision Baseline Vision/History: 1 Wears  glasses (readers) Ability to See in Adequate Light: 0 Adequate Patient Visual Report: No change from baseline       Perception         Praxis         Pertinent Vitals/Pain Pain Assessment Pain Assessment: Faces Faces Pain Scale: Hurts little more Pain Location: L hip Pain Descriptors / Indicators: Discomfort, Grimacing Pain Intervention(s): Limited activity within patient's tolerance, Monitored during session, Repositioned     Extremity/Trunk Assessment Upper Extremity Assessment Upper Extremity Assessment: Right hand dominant;Generalized weakness   Lower Extremity Assessment Lower Extremity Assessment: Defer to PT evaluation   Cervical / Trunk Assessment Cervical / Trunk Assessment: Normal   Communication Communication Communication: No apparent difficulties   Cognition Arousal: Alert Behavior During Therapy: WFL for tasks assessed/performed Cognition: Cognition impaired, No family/caregiver present to determine baseline     Awareness: Intellectual awareness intact, Online awareness intact Memory impairment (select all impairments): Short-term memory Attention impairment (select first level of impairment): Alternating attention   OT - Cognition Comments: Pt AAOx4 and pleasant throughout session. Pt cognition largely [facility] with noted short-term memory deficits.                 Following commands: Intact       Cueing  General Comments   Cueing Techniques: Verbal cues  VSS on RA throughout session   Exercises     Shoulder Instructions      Home Living Family/patient expects to be discharged to:: Private residence Living Arrangements: Alone (plans to move in with sister who lives in [place], [place] as soon as she is discharged) Available Help at Discharge: Family;Available 24 hours/day (71 yo sister) Type of Home: House Home Access: Level entry     Home Layout: One level     Bathroom Shower/Tub: Chief Strategy Officer: Handicapped  height Bathroom Accessibility: Yes How Accessible: Accessible via walker Home Equipment: Rolling Walker (2 wheels);Shower seat;Wheelchair - manual;Cane - single point   Additional Comments: Information above for sister's house      Prior Functioning/Environment Prior Level of Function : Independent/Modified Independent             Mobility Comments: has been using RW ADLs Comments: indep with ADLs and IADLs; was driving until about 3-4 months ago    OT Problem List: Decreased strength;Decreased activity tolerance;Impaired balance (sitting and/or standing);Pain;Decreased cognition   OT Treatment/Interventions: Self-care/ADL training;Therapeutic exercise;Energy conservation;DME and/or AE instruction;Therapeutic activities;Patient/family education;Balance training      OT Goals(Current goals can be found in the care plan section)   Acute Rehab OT Goals Patient Stated Goal: to get stronger, have less pain, and stay as independent as possible OT Goal Formulation: With patient Time For Goal Achievement: 03/13/24 Potential to Achieve Goals: Good ADL Goals Pt Will Perform Grooming: with modified independence;standing Pt Will Perform Lower Body Bathing: with modified independence;sit to/from stand Pt Will Perform Lower Body Dressing: with modified independence;sit to/from stand Pt Will Transfer to Toilet: with modified independence;ambulating;regular height toilet (with least restrictive AD) Pt Will Perform Toileting - Clothing Manipulation and hygiene: with modified independence;sit to/from stand Pt/caregiver will Perform Home Exercise Program: Increased strength;Both right and left upper extremity;With theraband;Independently;With written HEP provided   OT Frequency:  Min 2X/week    Co-evaluation              AM-PAC OT "6 Clicks" Daily Activity     Outcome Measure Help from another  person eating meals?: None Help from another person taking care of personal grooming?: A  Little Help from another person toileting, which includes using toliet, bedpan, or urinal?: A Little Help from another person bathing (including washing, rinsing, drying)?: A Little Help from another person to put on and taking off regular upper body clothing?: A Little Help from another person to put on and taking off regular lower body clothing?: A Little 6 Click Score: 19   End of Session Equipment Utilized During Treatment: Rolling walker (2 wheels) Nurse Communication: Mobility status  Activity Tolerance: Patient tolerated treatment well;Patient limited by pain;Patient limited by fatigue Patient left: in bed;with call bell/phone within reach;with bed alarm set  OT Visit Diagnosis: Unsteadiness on feet (R26.81);Muscle weakness (generalized) (M62.81);Other symptoms and signs involving cognitive function;Other (comment);Pain (decreased activity tolerance)                Time: 6045-4098 OT Time Calculation (min): 31 min Charges:  OT General Charges $OT Visit: 1 Visit OT Evaluation $OT Eval Low Complexity: 1 Low OT Treatments $Self Care/Home Management : 8-22 mins  Zayn Selley "Darral Ellis., OTR/L, MA Acute Rehab [phone]   Walt Gunner 02/28/2024, 7:00 PM

## 2024-02-29 ENCOUNTER — Encounter (HOSPITAL_COMMUNITY): Payer: Self-pay | Admitting: Critical Care Medicine

## 2024-02-29 DIAGNOSIS — K859 Acute pancreatitis without necrosis or infection, unspecified: Secondary | ICD-10-CM | POA: Diagnosis not present

## 2024-02-29 DIAGNOSIS — R7881 Bacteremia: Secondary | ICD-10-CM | POA: Diagnosis not present

## 2024-02-29 DIAGNOSIS — B9620 Unspecified Escherichia coli [E. coli] as the cause of diseases classified elsewhere: Secondary | ICD-10-CM | POA: Diagnosis not present

## 2024-02-29 DIAGNOSIS — K2980 Duodenitis without bleeding: Secondary | ICD-10-CM | POA: Diagnosis not present

## 2024-02-29 LAB — COMPREHENSIVE METABOLIC PANEL WITH GFR
ALT: 33 U/L (ref 0–44)
AST: 52 U/L — ABNORMAL HIGH (ref 15–41)
Albumin: 2.5 g/dL — ABNORMAL LOW (ref 3.5–5.0)
Alkaline Phosphatase: 64 U/L (ref 38–126)
Anion gap: 4 — ABNORMAL LOW (ref 5–15)
BUN: 7 mg/dL — ABNORMAL LOW (ref 8–23)
CO2: 26 mmol/L (ref 22–32)
Calcium: 9.3 mg/dL (ref 8.9–10.3)
Chloride: 110 mmol/L (ref 98–111)
Creatinine, Ser: 0.69 mg/dL (ref 0.44–1.00)
GFR, Estimated: >60 mL/min (ref >60)
Glucose, Bld: 104 mg/dL — ABNORMAL HIGH (ref 70–99)
Potassium: 4.1 mmol/L (ref 3.5–5.1)
Sodium: 140 mmol/L (ref 135–145)
Total Bilirubin: 0.6 mg/dL (ref 0.0–1.2)
Total Protein: 5.1 g/dL — ABNORMAL LOW (ref 6.5–8.1)

## 2024-02-29 LAB — CBC
HCT: 30.1 % — ABNORMAL LOW (ref 36.0–46.0)
Hemoglobin: 10 g/dL — ABNORMAL LOW (ref 12.0–15.0)
MCH: 31.9 pg (ref 26.0–34.0)
MCHC: 33.2 g/dL (ref 30.0–36.0)
MCV: 96.2 fL (ref 80.0–100.0)
Platelets: 116 10*3/uL — ABNORMAL LOW (ref 150–400)
RBC: 3.13 MIL/uL — ABNORMAL LOW (ref 3.87–5.11)
RDW: 14.2 % (ref 11.5–15.5)
WBC: 3.3 10*3/uL — ABNORMAL LOW (ref 4.0–10.5)
nRBC: 0 % (ref 0.0–0.2)

## 2024-02-29 LAB — CULTURE, BLOOD (ROUTINE X 2): Special Requests: ADEQUATE

## 2024-02-29 LAB — GLUCOSE, CAPILLARY
Glucose-Capillary: 101 mg/dL — ABNORMAL HIGH (ref 70–99)
Glucose-Capillary: 98 mg/dL (ref 70–99)
Glucose-Capillary: 117 mg/dL — ABNORMAL HIGH (ref 70–99)
Glucose-Capillary: 106 mg/dL — ABNORMAL HIGH (ref 70–99)
Glucose-Capillary: 126 mg/dL — ABNORMAL HIGH (ref 70–99)

## 2024-02-29 LAB — PHOSPHORUS: Phosphorus: 2.4 mg/dL — ABNORMAL LOW (ref 2.5–4.6)

## 2024-02-29 LAB — MAGNESIUM: Magnesium: 1.6 mg/dL — ABNORMAL LOW (ref 1.7–2.4)

## 2024-02-29 LAB — LIPASE, BLOOD: Lipase: 196 U/L — ABNORMAL HIGH (ref 11–51)

## 2024-02-29 MED ORDER — POTASSIUM & SODIUM PHOSPHATES 280-160-250 MG PO PACK
1.0000 | PACK | Freq: Once | ORAL | Status: AC
  Administered 2024-02-29: 1 via ORAL
  Filled 2024-02-29: qty 1

## 2024-02-29 MED ORDER — CEPHALEXIN 500 MG PO CAPS: 500.0000 mg | ORAL_CAPSULE | Freq: Four times a day (QID) | ORAL | 0 refills | Status: AC

## 2024-02-29 MED ORDER — MAGNESIUM OXIDE -MG SUPPLEMENT 400 (240 MG) MG PO TABS
400.0000 mg | ORAL_TABLET | Freq: Once | ORAL | Status: AC
  Administered 2024-02-29: 400 mg via ORAL
  Filled 2024-02-29: qty 1

## 2024-02-29 MED ORDER — OXYCODONE HCL 5 MG PO TABS: 5.0000 mg | ORAL_TABLET | Freq: Four times a day (QID) | ORAL | 0 refills | Status: AC | PRN

## 2024-02-29 MED ORDER — CEPHALEXIN 500 MG PO CAPS
500.0000 mg | ORAL_CAPSULE | Freq: Four times a day (QID) | ORAL | Status: DC
  Administered 2024-02-29 – 2024-03-01 (×3): 500 mg via ORAL
  Filled 2024-02-29 (×4): qty 1

## 2024-02-29 NOTE — Progress Notes (Signed)
 Mobility Specialist Progress Note;   02/29/24 0918  Mobility  Activity Transferred from bed to chair  Level of Assistance Contact guard assist, steadying assist  Assistive Device None  Distance Ambulated (ft) 3 ft  Activity Response Tolerated well  Mobility Referral Yes  Mobility visit 1 Mobility  Mobility Specialist Start Time (ACUTE ONLY) [id number]  Mobility Specialist Stop Time (ACUTE ONLY) 0926  Mobility Specialist Time Calculation (min) (ACUTE ONLY) 8 min   Pt agreeable to mobility. States her L hip pain is tolerable this AM. Required MinG assistance to safely transfer pt from bed to chair. VSS throughout and no c/o when asked. Pt left in chair with all needs met, alarm on.   Janit Meline Mobility Specialist Please contact via SecureChat or [employer] [phone]

## 2024-02-29 NOTE — Discharge Summary (Addendum)
 Physician Discharge Summary   Patient: Jennifer Warner MRN: [id number] DOB: [birth year]  Admit date:     02/26/2024  Discharge date: 02/29/24  Discharge Physician: [name]   PCP: Virgle Grime, MD   Recommendations at discharge:   Addendum: Patient was to be discharged today however the patient states that she is not arranged for transportation until tomorrow.   Pt to be discharged home with home health.   If you experience worsening fever, chills, chest pain, shortness of breath, or other concerning symptoms, please call your PCP or go to the emergency department immediately.  Discharge Diagnoses: Active Problems:   Pancreatitis, acute   E coli bacteremia   Demand ischemia (HCC)   History of pulmonary embolism   CAD (coronary artery disease)   Hyperlipidemia   Essential hypertension   Abnormal EKG   Hypophosphatemia   Pressure injury of skin   Hypomagnesemia   Hypokalemia   DMII (diabetes mellitus, type 2) (HCC)   Duodenitis  Principal Problem (Resolved):   Septic shock [facility])   Hospital Course:  89 year old female with PMH GERD, CAD s/p CABG, HTN, HLD, DM II, history of PE on chronic Eliquis , anemia who presented with chest pain, abdominal pain, back pain. She had elevated troponin with EKG changes and was taken for catheterization for further workup.  Heart cath showed no significant obstruction nor cardiac cause.  She also had developed worsening hypotension on admission warranting use of Levophed. Further workup had also shown elevated lipase, acute edematous pancreatitis with possible duodenitis. Urinalysis was negative for signs of infection.  Blood cultures showed 4/4 bottles with E. coli.   Assessment and Plan:   Septic shock with E. coli bacteremia - On presentation, tachycardia, neutropenia, hypotension requiring Levophed use.  Subsequently weaned off Levophed.  Sepsis appears resolved.  Pansensitive E. coli bacteremia 4/4 bottles positive on  admission.  Source unclear, presuming GI source given underlying pancreatitis.  Received empiric Rocephin .  Will transition over to p.o. Keflex to take as directed upon discharge.   Acute pancreatitis - Etiology unclear.  History of cholecystectomy, denies alcohol.  CT negative for biliary dilation.  Showed improvement with aggressive IV fluid hydration.  GI consulted and following closely.  No plans for endoscopic procedures.  Transition from heparin  drip back to Eliquis .  Tolerated advancement in diet.  Appears resolved.  Recommend continued avoidance of Tylenol .  Patient to follow-up with GI in the outpatient setting.   Demand ischemia - Status post cath 5/12 which was negative for disease.  Echo 5/13 noting EF 65%, grade 1 DD.   Diabetes mellitus -Resume home medication regimen   Hypokalemia/hypomagnesemia/hypophosphatemia - Resolved after replenishment   Hypertension - Can resume home medication regimen   History of PE - Resume Eliquis .   Physical debilitation and muscle weakness - Evaluated by PT/OT, recommending home health.     Consultants: Gastroenterology Procedures performed: None Disposition: Home health Diet recommendation:  Discharge Diet Orders (From admission, onward)     Start     Ordered   02/29/24 0000  Diet - low sodium heart healthy        02/29/24 1044           Carb modified diet  DISCHARGE MEDICATION: Allergies as of 02/29/2024       Reactions   Levofloxacin  Nausea And Vomiting        Medication List     STOP taking these medications    diphenhydramine -acetaminophen  25-500 MG Tabs tablet Commonly known as: TYLENOL   PM   gabapentin 300 MG capsule Commonly known as: NEURONTIN   ibandronate 150 MG tablet Commonly known as: BONIVA       TAKE these medications    amLODipine  5 MG tablet Commonly known as: NORVASC  Take 1 tablet (5 mg total) by mouth daily.   atorvastatin  20 MG tablet Commonly known as: LIPITOR Take 1 tablet by  mouth once daily   cephALEXin 500 MG capsule Commonly known as: KEFLEX Take 1 capsule (500 mg total) by mouth 4 (four) times daily for 5 days.   Eliquis  2.5 MG Tabs tablet Generic drug: apixaban  Take 1 tablet by mouth twice daily   fenofibrate  145 MG tablet Commonly known as: TRICOR  Take 1 tablet (145 mg total) by mouth every evening.   Lantus  SoloStar 100 UNIT/ML Solostar Pen Generic drug: insulin  glargine Inject 12 Units into the skin at bedtime.   losartan  25 MG tablet Commonly known as: COZAAR  Take 25 mg by mouth daily.   metoprolol  tartrate 25 MG tablet Commonly known as: LOPRESSOR  Take 1 tablet (25 mg total) by mouth 2 (two) times daily.   oxyCODONE  5 MG immediate release tablet Commonly known as: Oxy IR/ROXICODONE  Take 1 tablet (5 mg total) by mouth every 6 (six) hours as needed for moderate pain (pain score 4-6).   pantoprazole  40 MG tablet Commonly known as: PROTONIX  Take 40 mg by mouth every morning.   Vitamin D (Ergocalciferol) 50 MCG (2000 UT) Caps Take 2,000 Units by mouth daily.               Discharge Care Instructions  (From admission, onward)           Start     Ordered   02/29/24 0000  Discharge wound care:       Comments: Offload pressure to sacrum   02/29/24 1044             Discharge Exam: Filed Weights   02/27/24 0426 02/28/24 0500 02/29/24 0420  Weight: 51.9 kg 50.7 kg 49.1 kg    GENERAL:  Alert, pleasant, no acute distress  HEENT:  EOMI CARDIOVASCULAR: Irregularly irregular, no murmurs appreciated RESPIRATORY:  Clear to auscultation, no wheezing, rales, or rhonchi GASTROINTESTINAL:  Soft, nontender, nondistended EXTREMITIES:  No LE edema bilaterally NEURO:  No new focal deficits appreciated SKIN:  No rashes noted PSYCH:  Appropriate mood and affect    Condition at discharge: improving  The results of significant diagnostics from this hospitalization (including imaging, microbiology, ancillary and laboratory)  are listed below for reference.   Imaging Studies: US  Abdomen Limited RUQ (LIVER/GB) Result Date: 02/28/2024 CLINICAL DATA:  Pancreatitis.  Prior cholecystectomy EXAM: ULTRASOUND ABDOMEN LIMITED RIGHT UPPER QUADRANT COMPARISON:  CTA 02/26/2024 FINDINGS: Gallbladder: Previous cholecystectomy. Common bile duct: Diameter: 5 mm Liver: Echogenic [place] parenchyma with slightly nodular contour. Please correlate for any known chronic liver disease. Portal vein is patent on color Doppler imaging with normal direction of blood flow towards the liver. Other: Mildly echogenic pancreas. The stranding seen by CT scan is not as well appreciated today. IMPRESSION: Heterogeneous nodular liver. Previous cholecystectomy.  No ductal dilatation. Mild echogenic appearance to the pancreas. Please correlate with clinical history Electronically Signed   By: Adrianna Horde M.D.   On: 02/28/2024 11:50   ECHOCARDIOGRAM COMPLETE Result Date: 02/27/2024    ECHOCARDIOGRAM REPORT   Patient Name:   Jennifer Warner Date of Exam: 02/27/2024 Medical Rec #:  [id number]       Height:  63.0 in Accession #:    [id number]      Weight:       114.4 lb Date of Birth:  September 15, 1934       BSA:          1.525 m Patient Age:    89 years        BP:           119/73 mmHg Patient Gender: F               HR:           77 bpm. Exam Location:  Inpatient Procedure: 2D Echo, Cardiac Doppler and Color Doppler (Both Spectral and Color            Flow Doppler were utilized during procedure). Indications:    Bacteremia  History:        Patient has prior history of Echocardiogram examinations, most                 recent 05/19/2023.  Sonographer:    Andrena Bang Referring Phys: [phone] Ky Phillips HOFFMAN IMPRESSIONS  1. Left ventricular ejection fraction, by estimation, is 65%. The left ventricle has normal function. The left ventricle has no regional wall motion abnormalities. There is mild asymmetric left ventricular hypertrophy of the basal-septal segment. Left ventricular  diastolic parameters are consistent with Grade I diastolic dysfunction (impaired relaxation).  2. Right ventricular systolic function is normal. The right ventricular size is normal. There is normal pulmonary artery systolic pressure.  3. The mitral valve is normal in structure. No evidence of mitral valve regurgitation. No evidence of mitral stenosis.  4. The aortic valve is tricuspid. There is mild calcification of the aortic valve. Aortic valve regurgitation is not visualized. No aortic stenosis is present.  5. The inferior vena cava is normal in size with greater than 50% respiratory variability, suggesting right atrial pressure of 3 mmHg. Comparison(s): No significant change from prior study. Conclusion(s)/Recommendation(s): No evidence of valvular vegetations on this transthoracic echocardiogram. Consider a transesophageal echocardiogram to exclude infective endocarditis if clinically indicated. FINDINGS  Left Ventricle: Left ventricular ejection fraction, by estimation, is 65%. The left ventricle has normal function. The left ventricle has no regional wall motion abnormalities. Strain was performed and the global longitudinal strain is indeterminate. The left ventricular internal cavity size was normal in size. There is mild asymmetric left ventricular hypertrophy of the basal-septal segment. Left ventricular diastolic parameters are consistent with Grade I diastolic dysfunction (impaired relaxation). Right Ventricle: The right ventricular size is normal. No increase in right ventricular wall thickness. Right ventricular systolic function is normal. There is normal pulmonary artery systolic pressure. The tricuspid regurgitant velocity is 2.12 m/s, and  with an assumed right atrial pressure of 3 mmHg, the estimated right ventricular systolic pressure is 21.0 mmHg. Left Atrium: Left atrial size was normal in size. Right Atrium: Right atrial size was normal in size. Pericardium: There is no evidence of  pericardial effusion. Mitral Valve: The mitral valve is normal in structure. No evidence of mitral valve regurgitation. No evidence of mitral valve stenosis. Tricuspid Valve: The tricuspid valve is normal in structure. Tricuspid valve regurgitation is mild . No evidence of tricuspid stenosis. Aortic Valve: The aortic valve is tricuspid. There is mild calcification of the aortic valve. Aortic valve regurgitation is not visualized. No aortic stenosis is present. Aortic valve mean gradient measures 2.0 mmHg. Aortic valve peak gradient measures 4.6 mmHg. Aortic valve area, by VTI measures 2.49 cm. Pulmonic  Valve: The pulmonic valve was normal in structure. Pulmonic valve regurgitation is trivial. No evidence of pulmonic stenosis. Aorta: The aortic root and ascending aorta are structurally normal, with no evidence of dilitation. Venous: The inferior vena cava is normal in size with greater than 50% respiratory variability, suggesting right atrial pressure of 3 mmHg. IAS/Shunts: The interatrial septum appears to be lipomatous. The interatrial septum was not well visualized. Additional Comments: 3D was performed not requiring image post processing on an independent workstation and was indeterminate.  LEFT VENTRICLE PLAX 2D LVIDd:         3.90 cm     Diastology LVIDs:         2.40 cm     LV e' medial:    5.87 cm/s LV PW:         1.20 cm     LV E/e' medial:  15.4 LV IVS:        0.90 cm     LV e' lateral:   7.40 cm/s LVOT diam:     1.80 cm     LV E/e' lateral: 12.2 LV SV:         51 LV SV Index:   33 LVOT Area:     2.54 cm  LV Volumes (MOD) LV vol d, MOD A2C: 63.3 ml LV vol d, MOD A4C: 78.5 ml LV vol s, MOD A2C: 22.3 ml LV vol s, MOD A4C: 27.4 ml LV SV MOD A2C:     41.0 ml LV SV MOD A4C:     78.5 ml LV SV MOD BP:      45.9 ml RIGHT VENTRICLE RV S prime:     11.90 cm/s TAPSE (M-mode): 1.4 cm LEFT ATRIUM             Index LA diam:        3.70 cm 2.43 cm/m LA Vol (A2C):   54.6 ml 35.80 ml/m LA Vol (A4C):   16.2 ml 10.62  ml/m LA Biplane Vol: 32.2 ml 21.11 ml/m  AORTIC VALVE AV Area (Vmax):    2.19 cm AV Area (Vmean):   2.05 cm AV Area (VTI):     2.49 cm AV Vmax:           107.00 cm/s AV Vmean:          67.900 cm/s AV VTI:            0.204 m AV Peak Grad:      4.6 mmHg AV Mean Grad:      2.0 mmHg LVOT Vmax:         92.00 cm/s LVOT Vmean:        54.700 cm/s LVOT VTI:          0.200 m LVOT/AV VTI ratio: 0.98  AORTA Ao Asc diam: 3.00 cm MITRAL VALVE                TRICUSPID VALVE MV Area (PHT): 3.79 cm     TR Peak grad:   18.0 mmHg MV Decel Time: 200 msec     TR Vmax:        212.00 cm/s MV E velocity: 90.30 cm/s MV A velocity: 112.00 cm/s  SHUNTS MV E/A ratio:  0.81         Systemic VTI:  0.20 m                             Systemic Diam: 1.80 cm  Gloriann Larger MD Electronically signed by Gloriann Larger MD Signature Date/Time: 02/27/2024/11:49:39 AM    Final    CARDIAC CATHETERIZATION Addendum Date: 02/26/2024 Coronary and bypass graft angiography 02/26/2024: LM: Normal LAD: Prox 100% occlusion Lcx: Dominant vessel. 100% mid vessel occlusion RCA: Small nondominant vessel with moderate disease LIMA-LAD: Patent, no significant disease SVG-OM: Patent, no significant disease LVEDP could not be calculated No acute cardiac cause to explain circulatory shock BP much better in the cath lab, therefore norepinephrine turned off Will need medical admission for management of likely pancreatitis. Last dose of Eliquis  was 5/11 AM. Will need anticoagulation after 7:30 (2 hrs since Mynx placement) Will place order for heparin . Cody Das, MD   Result Date: 02/26/2024 Coronary and bypass graft angiography 02/26/2024: LM: Normal LAD: Prox 100% occlusion Lcx: Dominant vessel. 100% mid vessel occlusion RCA: Small nondominant vessel with moderate disease LIMA-LAD: Patent, no significant disease SVG-OM: Patent, no significant disease LVEDP could not be calculated No acute cardiac cause to explain circulatory shock BP much better  in the cath lab, therefore norepinephrine turned off Will need medical admission for management of likely pancreatitis  CT Angio Aortobifemoral W and/or Wo Contrast Result Date: 02/26/2024 CLINICAL DATA:  Claudication or leg ischemia. EXAM: CT ANGIOGRAPHY OF THORACOABDOMINAL AORTA WITH ILIOFEMORAL RUNOFF TECHNIQUE: Multidetector CT imaging of the chest, abdomen, pelvis and lower extremities was performed using the standard protocol during bolus administration of intravenous contrast. Multiplanar CT image reconstructions and MIPs were obtained to evaluate the vascular anatomy. RADIATION DOSE REDUCTION: This exam was performed according to the departmental dose-optimization program which includes automated exposure control, adjustment of the mA and/or kV according to patient size and/or use of iterative reconstruction technique. CONTRAST:  75mL OMNIPAQUE  IOHEXOL  350 MG/ML SOLN COMPARISON:  None Available. FINDINGS: CHEST No cardiac enlargement or pericardial effusion. Extensive atheromatous calcification of the aorta and coronaries with CABG. LIMA and a venous graft are enhancing. No aortic dilatation, dissection, or intramural hematoma. Small sliding hiatal hernia.  No mass, adenopathy, or hematoma. There is no edema, consolidation, effusion, or pneumothorax. No acute or aggressive osseous finding VASCULAR Aorta: Confluent atheromatous plaque, primarily calcified Celiac: Prominent plaque at the bifurcation with over 50% stenosis by sagittal reformats. No emergent finding SMA: Plaque at the ostium without significant stenosis. No beading, branch occlusion, or ulcer. Renals: Atheromatous calcification at the renal artery ostia. There could be hemodynamically significant stenosis on the right. No beading or aneurysm. IMA: Patent RIGHT Lower Extremity Inflow: Atheromatous plaque without dissection, aneurysm, or flow reducing stenosis. Outflow: Generalized atheromatous plaque in the SFA without focal and flow reducing  stenosis. Bulky calcified plaque at the above the knee popliteal artery with high-grade stenosis. Runoff: Confluent calcification which limits visualization of luminal enhancement. As well, the scan likely out paces the bolus when comparing to the right. There is patency at least to the tibioperoneal trunk. LEFT Lower Extremity Inflow: Bulky calcified plaque at the proximal common iliac artery causes advanced stenosis, patent lumen difficult to even measure on coronal reformats. No dissection or aneurysm. Outflow: Notable plaque in the SFA just beyond the common femoral bifurcation which is likely at least 50% based on coronal reformats as marked on series 11. SFA atheromatous calcification is widespread with accentuated calcified plaque at the popliteal artery where there is an additional flow reducing stenosis on 9:243 Runoff: Limited by bolus timing and heavily calcified arteries, there is at least patency to the tibioperoneal trunk. No filling of the anterior tibial artery throughout the leg.  Veins: Negative Review of the MIP images confirms the above findings. NON-VASCULAR Hepatobiliary: No focal liver abnormality.Cholecystectomy. No biliary dilatation. Pancreas: Expanded head with regional edema. No ductal dilatation or visible mass. Spleen: Unremarkable. Adrenals/Urinary Tract: Negative adrenals. No hydronephrosis or stone. Unremarkable bladder. Stomach/Bowel: Low-density thickening of the distal stomach and duodenum wall with regional edema. Mid duodenal diverticula. Innumerable colonic diverticula. Lymphatic: No mass or adenopathy. Reproductive:Hysterectomy Other: No ascites or pneumoperitoneum. Tiny fatty left groin hernia. Musculoskeletal: No acute abnormalities. Lumbar spine degeneration focally severe at L3-4. Non worrisome enchondroma in the left distal femur. IMPRESSION: Negative for acute aortic syndrome. No emergent arterial finding in the lower extremities, limited below the knees due to degree of  calcified peripheral vascular disease and bolus timing. Chronic flow reducing atheromatous stenoses in the left common iliac, left SFA, and bilateral popliteal arteries. Acute edematous pancreatitis and/or duodenitis. Electronically Signed   By: Ronnette Coke M.D.   On: 02/26/2024 04:55   CT ANGIO CHEST AORTA W/CM &/OR WO/CM Result Date: 02/26/2024 CLINICAL DATA:  Claudication or leg ischemia. EXAM: CT ANGIOGRAPHY OF THORACOABDOMINAL AORTA WITH ILIOFEMORAL RUNOFF TECHNIQUE: Multidetector CT imaging of the chest, abdomen, pelvis and lower extremities was performed using the standard protocol during bolus administration of intravenous contrast. Multiplanar CT image reconstructions and MIPs were obtained to evaluate the vascular anatomy. RADIATION DOSE REDUCTION: This exam was performed according to the departmental dose-optimization program which includes automated exposure control, adjustment of the mA and/or kV according to patient size and/or use of iterative reconstruction technique. CONTRAST:  75mL OMNIPAQUE  IOHEXOL  350 MG/ML SOLN COMPARISON:  None Available. FINDINGS: CHEST No cardiac enlargement or pericardial effusion. Extensive atheromatous calcification of the aorta and coronaries with CABG. LIMA and a venous graft are enhancing. No aortic dilatation, dissection, or intramural hematoma. Small sliding hiatal hernia.  No mass, adenopathy, or hematoma. There is no edema, consolidation, effusion, or pneumothorax. No acute or aggressive osseous finding VASCULAR Aorta: Confluent atheromatous plaque, primarily calcified Celiac: Prominent plaque at the bifurcation with over 50% stenosis by sagittal reformats. No emergent finding SMA: Plaque at the ostium without significant stenosis. No beading, branch occlusion, or ulcer. Renals: Atheromatous calcification at the renal artery ostia. There could be hemodynamically significant stenosis on the right. No beading or aneurysm. IMA: Patent RIGHT Lower Extremity  Inflow: Atheromatous plaque without dissection, aneurysm, or flow reducing stenosis. Outflow: Generalized atheromatous plaque in the SFA without focal and flow reducing stenosis. Bulky calcified plaque at the above the knee popliteal artery with high-grade stenosis. Runoff: Confluent calcification which limits visualization of luminal enhancement. As well, the scan likely out paces the bolus when comparing to the right. There is patency at least to the tibioperoneal trunk. LEFT Lower Extremity Inflow: Bulky calcified plaque at the proximal common iliac artery causes advanced stenosis, patent lumen difficult to even measure on coronal reformats. No dissection or aneurysm. Outflow: Notable plaque in the SFA just beyond the common femoral bifurcation which is likely at least 50% based on coronal reformats as marked on series 11. SFA atheromatous calcification is widespread with accentuated calcified plaque at the popliteal artery where there is an additional flow reducing stenosis on 9:243 Runoff: Limited by bolus timing and heavily calcified arteries, there is at least patency to the tibioperoneal trunk. No filling of the anterior tibial artery throughout the leg. Veins: Negative Review of the MIP images confirms the above findings. NON-VASCULAR Hepatobiliary: No focal liver abnormality.Cholecystectomy. No biliary dilatation. Pancreas: Expanded head with regional edema. No ductal dilatation or visible  mass. Spleen: Unremarkable. Adrenals/Urinary Tract: Negative adrenals. No hydronephrosis or stone. Unremarkable bladder. Stomach/Bowel: Low-density thickening of the distal stomach and duodenum wall with regional edema. Mid duodenal diverticula. Innumerable colonic diverticula. Lymphatic: No mass or adenopathy. Reproductive:Hysterectomy Other: No ascites or pneumoperitoneum. Tiny fatty left groin hernia. Musculoskeletal: No acute abnormalities. Lumbar spine degeneration focally severe at L3-4. Non worrisome enchondroma  in the left distal femur. IMPRESSION: Negative for acute aortic syndrome. No emergent arterial finding in the lower extremities, limited below the knees due to degree of calcified peripheral vascular disease and bolus timing. Chronic flow reducing atheromatous stenoses in the left common iliac, left SFA, and bilateral popliteal arteries. Acute edematous pancreatitis and/or duodenitis. Electronically Signed   By: Ronnette Coke M.D.   On: 02/26/2024 04:55   DG Chest Portable 1 View Result Date: 02/26/2024 CLINICAL DATA:  Initial evaluation for acute sepsis. EXAM: PORTABLE CHEST 1 VIEW COMPARISON:  Prior study from 08/14/2017. FINDINGS: Median sternotomy wires with underlying surgical clips. Transverse heart size within normal limits. Mediastinal silhouette normal. Aortic atherosclerosis. Lungs are hypoinflated. Streaky opacities within the mid and right upper lung, likely atelectasis and/or scarring. No consolidative airspace disease. No pulmonary edema or pleural effusion. No pneumothorax. Visualized osseous structures demonstrate no acute finding. Osteopenia noted. IMPRESSION: 1. Streaky opacities within the mid and right upper lung, likely atelectasis and/or scarring. 2. No other active cardiopulmonary disease. Electronically Signed   By: Virgia Griffins M.D.   On: 02/26/2024 04:22    Microbiology: Results for orders placed or performed during the hospital encounter of 02/26/24  Culture, blood (Routine x 2)     Status: Abnormal   Collection Time: 02/26/24  2:12 AM   Specimen: BLOOD RIGHT ARM  Result Value Ref Range Status   Specimen Description BLOOD RIGHT ARM  Final   Special Requests   Final    BOTTLES DRAWN AEROBIC AND ANAEROBIC Blood Culture adequate volume   Culture  Setup Time   Final    GRAM NEGATIVE RODS IN BOTH AEROBIC AND ANAEROBIC BOTTLES CRITICAL RESULT CALLED TO, READ BACK BY AND VERIFIED WITH: PHARMD M. PHAM [id number] @ 1557 FH    Culture (A)  Final    ESCHERICHIA  COLI SUSCEPTIBILITIES PERFORMED ON PREVIOUS CULTURE WITHIN THE LAST 5 DAYS. Performed at [facility], [address], [place], [place] [address]    Report Status 02/29/2024 FINAL  Final  Culture, blood (Routine x 2)     Status: Abnormal   Collection Time: 02/26/24  2:17 AM   Specimen: BLOOD  Result Value Ref Range Status   Specimen Description BLOOD RIGHT WRIST  Final   Special Requests   Final    BOTTLES DRAWN AEROBIC AND ANAEROBIC Blood Culture results may not be optimal due to an inadequate volume of blood received in culture bottles   Culture  Setup Time   Final    GRAM NEGATIVE RODS IN BOTH AEROBIC AND ANAEROBIC BOTTLES CRITICAL RESULT CALLED TO, READ BACK BY AND VERIFIED WITH: PHARMD M. [facility] [id number] @ 1557 FH Performed at [facility] [facility], [address], [place], [place] [address]    Culture ESCHERICHIA COLI (A)  Final   Report Status 02/29/2024 FINAL  Final   Organism ID, Bacteria ESCHERICHIA COLI  Final   Organism ID, Bacteria ESCHERICHIA COLI  Final      Susceptibility   Escherichia coli - KIRBY BAUER*    CEFAZOLIN SENSITIVE Sensitive    Escherichia coli - MIC*    AMPICILLIN 16  INTERMEDIATE Intermediate     CEFEPIME <=0.12 SENSITIVE Sensitive     CEFTAZIDIME <=1 SENSITIVE Sensitive     CEFTRIAXONE  <=0.25 SENSITIVE Sensitive     CIPROFLOXACIN <=0.25 SENSITIVE Sensitive     GENTAMICIN <=1 SENSITIVE Sensitive     IMIPENEM 0.5 SENSITIVE Sensitive     TRIMETH/SULFA <=20 SENSITIVE Sensitive     AMPICILLIN/SULBACTAM 4 SENSITIVE Sensitive     PIP/TAZO <=4 SENSITIVE Sensitive ug/mL    * ESCHERICHIA COLI    ESCHERICHIA COLI  Blood Culture ID Panel (Reflexed)     Status: Abnormal   Collection Time: 02/26/24  2:17 AM  Result Value Ref Range Status   Enterococcus faecalis NOT DETECTED NOT DETECTED Final   Enterococcus Faecium NOT DETECTED NOT DETECTED Final   Listeria monocytogenes NOT DETECTED NOT DETECTED Final   Staphylococcus species NOT DETECTED NOT DETECTED  Final   Staphylococcus aureus (BCID) NOT DETECTED NOT DETECTED Final   Staphylococcus epidermidis NOT DETECTED NOT DETECTED Final   Staphylococcus lugdunensis NOT DETECTED NOT DETECTED Final   Streptococcus species NOT DETECTED NOT DETECTED Final   Streptococcus agalactiae NOT DETECTED NOT DETECTED Final   Streptococcus pneumoniae NOT DETECTED NOT DETECTED Final   Streptococcus pyogenes NOT DETECTED NOT DETECTED Final   A.calcoaceticus-baumannii NOT DETECTED NOT DETECTED Final   Bacteroides fragilis NOT DETECTED NOT DETECTED Final   Enterobacterales DETECTED (A) NOT DETECTED Final    Comment: Enterobacterales represent a large order of gram negative bacteria, not a single organism. CRITICAL RESULT CALLED TO, READ BACK BY AND VERIFIED WITH: PHARMD M. PHAM [id number] @ 1557 FH    Enterobacter cloacae complex NOT DETECTED NOT DETECTED Final   Escherichia coli DETECTED (A) NOT DETECTED Final    Comment: CRITICAL RESULT CALLED TO, READ BACK BY AND VERIFIED WITH: PHARMD M. PHAM [id number] @ 1557 FH    Klebsiella aerogenes NOT DETECTED NOT DETECTED Final   Klebsiella oxytoca NOT DETECTED NOT DETECTED Final   Klebsiella pneumoniae NOT DETECTED NOT DETECTED Final   Proteus species NOT DETECTED NOT DETECTED Final   Salmonella species NOT DETECTED NOT DETECTED Final   Serratia marcescens NOT DETECTED NOT DETECTED Final   Haemophilus influenzae NOT DETECTED NOT DETECTED Final   Neisseria meningitidis NOT DETECTED NOT DETECTED Final   Pseudomonas aeruginosa NOT DETECTED NOT DETECTED Final   Stenotrophomonas maltophilia NOT DETECTED NOT DETECTED Final   Candida albicans NOT DETECTED NOT DETECTED Final   Candida auris NOT DETECTED NOT DETECTED Final   Candida glabrata NOT DETECTED NOT DETECTED Final   Candida krusei NOT DETECTED NOT DETECTED Final   Candida parapsilosis NOT DETECTED NOT DETECTED Final   Candida tropicalis NOT DETECTED NOT DETECTED Final   Cryptococcus neoformans/gattii NOT DETECTED NOT  DETECTED Final   CTX-M ESBL NOT DETECTED NOT DETECTED Final   Carbapenem resistance IMP NOT DETECTED NOT DETECTED Final   Carbapenem resistance KPC NOT DETECTED NOT DETECTED Final   Carbapenem resistance NDM NOT DETECTED NOT DETECTED Final   Carbapenem resist OXA 48 LIKE NOT DETECTED NOT DETECTED Final   Carbapenem resistance VIM NOT DETECTED NOT DETECTED Final    Comment: Performed at [facility], [address] [place], [place] [address]  Resp panel by RT-PCR (RSV, Flu A&B, Covid) Anterior Nasal Swab     Status: None   Collection Time: 02/26/24  2:31 AM   Specimen: Anterior Nasal Swab  Result Value Ref Range Status   SARS Coronavirus 2 by RT PCR NEGATIVE NEGATIVE Final   Influenza A by  PCR NEGATIVE NEGATIVE Final   Influenza B by PCR NEGATIVE NEGATIVE Final    Comment: (NOTE) The Xpert Xpress SARS-CoV-2/FLU/RSV plus assay is intended as an aid in the diagnosis of influenza from Nasopharyngeal swab specimens and should not be used as a sole basis for treatment. Nasal washings and aspirates are unacceptable for Xpert Xpress SARS-CoV-2/FLU/RSV testing.  Fact Sheet for Patients: BloggerCourse.com  Fact Sheet for Healthcare Providers: SeriousBroker.it  This test is not yet approved or cleared by the [place]  FDA and has been authorized for detection and/or diagnosis of SARS-CoV-2 by FDA under an Emergency Use Authorization (EUA). This EUA will remain in effect (meaning this test can be used) for the duration of the COVID-19 declaration under Section 564(b)(1) of the Act, 21 U.S.C. section 360bbb-3(b)(1), unless the authorization is terminated or revoked.     Resp Syncytial Virus by PCR NEGATIVE NEGATIVE Final    Comment: (NOTE) Fact Sheet for Patients: BloggerCourse.com  Fact Sheet for Healthcare Providers: SeriousBroker.it  This test is not yet approved or cleared  by the [place]  FDA and has been authorized for detection and/or diagnosis of SARS-CoV-2 by FDA under an Emergency Use Authorization (EUA). This EUA will remain in effect (meaning this test can be used) for the duration of the COVID-19 declaration under Section 564(b)(1) of the Act, 21 U.S.C. section 360bbb-3(b)(1), unless the authorization is terminated or revoked.  Performed at [facility], [address] [place], [place] [address]   MRSA Next Gen by PCR, Nasal     Status: None   Collection Time: 02/26/24  5:55 AM   Specimen: Nasal Mucosa; Nasal Swab  Result Value Ref Range Status   MRSA by PCR Next Gen NOT DETECTED NOT DETECTED Final    Comment: (NOTE) The GeneXpert MRSA Assay (FDA approved for NASAL specimens only), is one component of a comprehensive MRSA colonization surveillance program. It is not intended to diagnose MRSA infection nor to guide or monitor treatment for MRSA infections. Test performance is not FDA approved in patients less than 3 years old. Performed at [facility], [address] [place], [place] [address]     Labs: CBC: Recent Labs  Lab 02/26/24 [phone] 02/26/24 0219 02/27/24 1048 02/28/24 0232 02/29/24 0224  WBC 2.8*  --  5.1 3.6* 3.3*  NEUTROABS 2.5  --   --   --   --   HGB 10.8* 10.9* 9.8* 9.6* 10.0*  HCT 32.9* 32.0* 29.1* 29.0* 30.1*  MCV 98.2  --  97.3 95.4 96.2  PLT 155  --  104* 106* 116*   Basic Metabolic Panel: Recent Labs  Lab 02/26/24 0212 02/26/24 0219 02/27/24 0344 02/28/24 0232 02/29/24 0224  NA 140 140 142 143 140  K 3.8 3.8 3.4* 3.8 4.1  CL 107 106 112* 113* 110  CO2 21*  --  24 25 26   GLUCOSE 100* 95 90 94 104*  BUN 19 20 13  7* 7*  CREATININE 0.99 1.00 0.80 0.64 0.69  CALCIUM  10.0  --  8.4* 8.8* 9.3  MG  --   --  1.1* 1.9 1.6*  PHOS  --   --  2.4* 2.9 2.4*   Liver Function Tests: Recent Labs  Lab 02/26/24 0212 02/27/24 0344 02/28/24 0232 02/29/24 0224  AST 160* 84* 66* 52*  ALT 39 36 35  33  ALKPHOS 78 47 53 64  BILITOT 1.7* 0.8 0.6 0.6  PROT 5.6* 4.1* 4.7* 5.1*  ALBUMIN  3.1* 2.1* 2.4* 2.5*  CBG: Recent Labs  Lab 02/28/24 1953 02/28/24 2128 02/28/24 2357 02/29/24 0420 02/29/24 0752  GLUCAP 116* 150* 121* 101* 98    Discharge time spent: 25 minutes.  Signed: [name], DO [facility] 02/29/2024

## 2024-02-29 NOTE — Plan of Care (Signed)
  Problem: Education: Goal: Knowledge of General Education information will improve Description: Including pain rating scale, medication(s)/side effects and non-pharmacologic comfort measures Outcome: Progressing   Problem: Health Behavior/Discharge Planning: Goal: Ability to manage health-related needs will improve Outcome: Progressing   Problem: Clinical Measurements: Goal: Ability to maintain clinical measurements within normal limits will improve Outcome: Progressing Goal: Diagnostic test results will improve Outcome: Progressing Goal: Respiratory complications will improve Outcome: Progressing   Problem: Activity: Goal: Risk for activity intolerance will decrease Outcome: Progressing   Problem: Nutrition: Goal: Adequate nutrition will be maintained Outcome: Progressing   Problem: Coping: Goal: Level of anxiety will decrease Outcome: Progressing   Problem: Pain Managment: Goal: General experience of comfort will improve and/or be controlled Outcome: Progressing

## 2024-02-29 NOTE — Care Management Important Message (Signed)
 Important Message  Patient Details  Name: Jennifer Warner MRN: [id number] Date of Birth: Jan 28, 1934   Important Message Given:  Yes - Medicare IM     Wynonia Hedges 02/29/2024, 3:19 PM

## 2024-02-29 NOTE — TOC Progression Note (Addendum)
 Transition of Care [facility]) - Progression Note    Patient Details  Name: Jennifer Warner MRN: [id number] Date of Birth: 1934-05-16  Transition of Care [facility]) CM/SW Contact  Jennett Model, RN Phone Number: 02/29/2024, 11:05 AM  Clinical Narrative:    Bartholomew Light with [facility], states patient's insurance is not in network in [place]  and they can not take this referral.    NCM made referral to Nicholes Barks with [facility] [phone], fax [phone] for HHPT,  they are able to take referral with soc on Monday.     Expected Discharge Plan: Home w Home Health Services Barriers to Discharge: Continued Medical Work up  Expected Discharge Plan and Services   Discharge Planning Services: CM Consult Post Acute Care Choice: Home Health Living arrangements for the past 2 months: Mobile Home Expected Discharge Date: 02/29/24                                     Social Determinants of Health (SDOH) Interventions SDOH Screenings   Food Insecurity: No Food Insecurity (02/26/2024)  Housing: Low Risk  (02/26/2024)  Transportation Needs: No Transportation Needs (02/26/2024)  Utilities: Not At Risk (02/26/2024)  Financial Resource Strain: Low Risk  (02/21/2024)   Received from [facility]  Social Connections: Moderately Integrated (02/26/2024)  Tobacco Use: Low Risk  (02/29/2024)    Readmission Risk Interventions     No data to display

## 2024-02-29 NOTE — Progress Notes (Signed)
 Progress Note   Subjective  Patient doing better, pain improved, tolerating low fat diet.    Objective   Vital signs in last 24 hours: Temp:  [97.7 F (36.5 C)-98.6 F (37 C)] 97.7 F (36.5 C) (05/15 0734) Pulse Rate:  [68-89] 89 (05/15 0734) Resp:  [13-20] 20 (05/15 0734) BP: (122-171)/(60-87) 124/87 (05/15 0734) SpO2:  [92 %-97 %] 95 % (05/15 0734) Weight:  [49.1 kg] 49.1 kg (05/15 0420) Last BM Date : 02/28/24 General:    white female in NAD Neurologic:  Alert and oriented,  grossly normal neurologically. Psych:  Cooperative. Normal mood and affect.  Intake/Output from previous day: 05/14 0701 - 05/15 0700 In: 354 [P.O.:354] Out: 1450 [Urine:1450] Intake/Output this shift: No intake/output data recorded.  Lab Results: Recent Labs    02/27/24 1048 02/28/24 0232 02/29/24 0224  WBC 5.1 3.6* 3.3*  HGB 9.8* 9.6* 10.0*  HCT 29.1* 29.0* 30.1*  PLT 104* 106* 116*   BMET Recent Labs    02/27/24 0344 02/28/24 0232 02/29/24 0224  NA 142 143 140  K 3.4* 3.8 4.1  CL 112* 113* 110  CO2 24 25 26   GLUCOSE 90 94 104*  BUN 13 7* 7*  CREATININE 0.80 0.64 0.69  CALCIUM  8.4* 8.8* 9.3   LFT Recent Labs    02/29/24 0224  PROT 5.1*  ALBUMIN  2.5*  AST 52*  ALT 33  ALKPHOS 64  BILITOT 0.6   PT/INR No results for input(s): "LABPROT", "INR" in the last 72 hours.  Studies/Results: US  Abdomen Limited RUQ (LIVER/GB) Result Date: 02/28/2024 CLINICAL DATA:  Pancreatitis.  Prior cholecystectomy EXAM: ULTRASOUND ABDOMEN LIMITED RIGHT UPPER QUADRANT COMPARISON:  CTA 02/26/2024 FINDINGS: Gallbladder: Previous cholecystectomy. Common bile duct: Diameter: 5 mm Liver: Echogenic [place] parenchyma with slightly nodular contour. Please correlate for any known chronic liver disease. Portal vein is patent on color Doppler imaging with normal direction of blood flow towards the liver. Other: Mildly echogenic pancreas. The stranding seen by CT scan is not as well appreciated  today. IMPRESSION: Heterogeneous nodular liver. Previous cholecystectomy.  No ductal dilatation. Mild echogenic appearance to the pancreas. Please correlate with clinical history Electronically Signed   By: Adrianna Horde M.D.   On: 02/28/2024 11:50   ECHOCARDIOGRAM COMPLETE Result Date: 02/27/2024    ECHOCARDIOGRAM REPORT   Patient Name:   Jennifer Warner Date of Exam: 02/27/2024 Medical Rec #:  [id number]       Height:       63.0 in Accession #:    [id number]      Weight:       114.4 lb Date of Birth:  Feb 06, 1934       BSA:          1.525 m Patient Age:    89 years        BP:           119/73 mmHg Patient Gender: F               HR:           77 bpm. Exam Location:  Inpatient Procedure: 2D Echo, Cardiac Doppler and Color Doppler (Both Spectral and Color            Flow Doppler were utilized during procedure). Indications:    Bacteremia  History:        Patient has prior history of Echocardiogram examinations, most  recent 05/19/2023.  Sonographer:    Andrena Bang Referring Phys: [phone] Ky Phillips HOFFMAN IMPRESSIONS  1. Left ventricular ejection fraction, by estimation, is 65%. The left ventricle has normal function. The left ventricle has no regional wall motion abnormalities. There is mild asymmetric left ventricular hypertrophy of the basal-septal segment. Left ventricular diastolic parameters are consistent with Grade I diastolic dysfunction (impaired relaxation).  2. Right ventricular systolic function is normal. The right ventricular size is normal. There is normal pulmonary artery systolic pressure.  3. The mitral valve is normal in structure. No evidence of mitral valve regurgitation. No evidence of mitral stenosis.  4. The aortic valve is tricuspid. There is mild calcification of the aortic valve. Aortic valve regurgitation is not visualized. No aortic stenosis is present.  5. The inferior vena cava is normal in size with greater than 50% respiratory variability, suggesting right atrial pressure of 3  mmHg. Comparison(s): No significant change from prior study. Conclusion(s)/Recommendation(s): No evidence of valvular vegetations on this transthoracic echocardiogram. Consider a transesophageal echocardiogram to exclude infective endocarditis if clinically indicated. FINDINGS  Left Ventricle: Left ventricular ejection fraction, by estimation, is 65%. The left ventricle has normal function. The left ventricle has no regional wall motion abnormalities. Strain was performed and the global longitudinal strain is indeterminate. The left ventricular internal cavity size was normal in size. There is mild asymmetric left ventricular hypertrophy of the basal-septal segment. Left ventricular diastolic parameters are consistent with Grade I diastolic dysfunction (impaired relaxation). Right Ventricle: The right ventricular size is normal. No increase in right ventricular wall thickness. Right ventricular systolic function is normal. There is normal pulmonary artery systolic pressure. The tricuspid regurgitant velocity is 2.12 m/s, and  with an assumed right atrial pressure of 3 mmHg, the estimated right ventricular systolic pressure is 21.0 mmHg. Left Atrium: Left atrial size was normal in size. Right Atrium: Right atrial size was normal in size. Pericardium: There is no evidence of pericardial effusion. Mitral Valve: The mitral valve is normal in structure. No evidence of mitral valve regurgitation. No evidence of mitral valve stenosis. Tricuspid Valve: The tricuspid valve is normal in structure. Tricuspid valve regurgitation is mild . No evidence of tricuspid stenosis. Aortic Valve: The aortic valve is tricuspid. There is mild calcification of the aortic valve. Aortic valve regurgitation is not visualized. No aortic stenosis is present. Aortic valve mean gradient measures 2.0 mmHg. Aortic valve peak gradient measures 4.6 mmHg. Aortic valve area, by VTI measures 2.49 cm. Pulmonic Valve: The pulmonic valve was normal in  structure. Pulmonic valve regurgitation is trivial. No evidence of pulmonic stenosis. Aorta: The aortic root and ascending aorta are structurally normal, with no evidence of dilitation. Venous: The inferior vena cava is normal in size with greater than 50% respiratory variability, suggesting right atrial pressure of 3 mmHg. IAS/Shunts: The interatrial septum appears to be lipomatous. The interatrial septum was not well visualized. Additional Comments: 3D was performed not requiring image post processing on an independent workstation and was indeterminate.  LEFT VENTRICLE PLAX 2D LVIDd:         3.90 cm     Diastology LVIDs:         2.40 cm     LV e' medial:    5.87 cm/s LV PW:         1.20 cm     LV E/e' medial:  15.4 LV IVS:        0.90 cm     LV e' lateral:   7.40  cm/s LVOT diam:     1.80 cm     LV E/e' lateral: 12.2 LV SV:         51 LV SV Index:   33 LVOT Area:     2.54 cm  LV Volumes (MOD) LV vol d, MOD A2C: 63.3 ml LV vol d, MOD A4C: 78.5 ml LV vol s, MOD A2C: 22.3 ml LV vol s, MOD A4C: 27.4 ml LV SV MOD A2C:     41.0 ml LV SV MOD A4C:     78.5 ml LV SV MOD BP:      45.9 ml RIGHT VENTRICLE RV S prime:     11.90 cm/s TAPSE (M-mode): 1.4 cm LEFT ATRIUM             Index LA diam:        3.70 cm 2.43 cm/m LA Vol (A2C):   54.6 ml 35.80 ml/m LA Vol (A4C):   16.2 ml 10.62 ml/m LA Biplane Vol: 32.2 ml 21.11 ml/m  AORTIC VALVE AV Area (Vmax):    2.19 cm AV Area (Vmean):   2.05 cm AV Area (VTI):     2.49 cm AV Vmax:           107.00 cm/s AV Vmean:          67.900 cm/s AV VTI:            0.204 m AV Peak Grad:      4.6 mmHg AV Mean Grad:      2.0 mmHg LVOT Vmax:         92.00 cm/s LVOT Vmean:        54.700 cm/s LVOT VTI:          0.200 m LVOT/AV VTI ratio: 0.98  AORTA Ao Asc diam: 3.00 cm MITRAL VALVE                TRICUSPID VALVE MV Area (PHT): 3.79 cm     TR Peak grad:   18.0 mmHg MV Decel Time: 200 msec     TR Vmax:        212.00 cm/s MV E velocity: 90.30 cm/s MV A velocity: 112.00 cm/s  SHUNTS MV E/A ratio:   0.81         Systemic VTI:  0.20 m                             Systemic Diam: 1.80 cm Gloriann Larger MD Electronically signed by Gloriann Larger MD Signature Date/Time: 02/27/2024/11:49:39 AM    Final        Assessment / Plan:    89 y/o female here with the following:   Acute pancreatitis Duodenitis E coli bactermia [place] ischemia   Improved, tolerating low fat diet at this point.    E coli bacteremia obviously concerning, unclear source. Biliary pancreatitis could certainly have been be a cause of that however her ducts were not dilated on imaging, she is s/p cholecystectomy, and had a very minimal elevation in AST. Follow up US  shows no sludge or retained stones. Possible she passed a stone / sludge but again labs / imaging not too impressive for that. Duodenitis likely secondary to pancreatitis and continue PPI. Can consider outpatient EGD but may just follow this up with imaging as she will need this as outpatient once through her acute course to ensure no mass lesions of the pancreas.  MRCP as outpatient to further evaluate her pancreas,  rule out small mass lesion, etc. Of note, RUQ questioned coarsening of the liver but CT shows no evidence of cirrhosis and normal spleen, I think cirrhosis is unlikely.  Of note, she had had significant hip pain and was taking a lot of tylenol  prior to admission. Specifically 1gm at least 4 times per day sometimes 5 or 6 times which is excessive. Tylenol  is a class II drug to cause pancreatitis at higher doses, she should hold that for now.   Defer duration of antibiotics to primary team for her bacteremia. She is getting better. Timing of discharge per primary service in regards to her needs for IV antibiotics She states upon discharge she plans on moving in with her sister in [place]  as long term plan but does wish to keep her medical care in [place] and I am happy to see her back upon discharge.   PLAN: - continue low fat diet  -  continue protonix  40mg  day and for another month post discharge - stop tylenol  - will need imaging as outpatient in several weeks (MRCP) - consideration for EGD pending her course as outpatient - duration of IV and oral antibiotics per primary service - okay to resume Eliquis  - can coordinate outpatient follow up with us  upon discharge   Will sign off for now, call with questions.  Christi Coward, MD [facility]

## 2024-02-29 NOTE — Progress Notes (Signed)
   02/29/24 1125  Spiritual Encounters  Type of Visit Initial  Care provided to: Patient  Conversation partners present during encounter Nurse  Reason for visit Advance directives  OnCall Visit No   Chaplain responding to Spiritual Consult seeking education and paperwork for advance care directive  Chaplain delivered education and explained the paperwork, giving the Pt instruction on how to address any questions and how to move forward.  After the education Pt began to share stories and life review.  Pt is a dedicated christian and anxious to share with all who enter her room.  This chaplain enjoyed time and conversation with her.  No further Spiritual Care interventions necessary at this time.  Chaplain services remain available by Spiritual Consult or for emergent cases, paging [phone]  Chaplain Dewitte Foreman, MDiv [email or web address] [phone]

## 2024-03-01 LAB — COMPREHENSIVE METABOLIC PANEL WITH GFR
ALT: 30 U/L (ref 0–44)
AST: 38 U/L (ref 15–41)
Albumin: 2.6 g/dL — ABNORMAL LOW (ref 3.5–5.0)
Alkaline Phosphatase: 85 U/L (ref 38–126)
Anion gap: 7 (ref 5–15)
BUN: 6 mg/dL — ABNORMAL LOW (ref 8–23)
CO2: 24 mmol/L (ref 22–32)
Calcium: 9.8 mg/dL (ref 8.9–10.3)
Chloride: 110 mmol/L (ref 98–111)
Creatinine, Ser: 0.67 mg/dL (ref 0.44–1.00)
GFR, Estimated: >60 mL/min (ref >60)
Glucose, Bld: 90 mg/dL (ref 70–99)
Potassium: 3.9 mmol/L (ref 3.5–5.1)
Sodium: 141 mmol/L (ref 135–145)
Total Bilirubin: 0.9 mg/dL (ref 0.0–1.2)
Total Protein: 5.5 g/dL — ABNORMAL LOW (ref 6.5–8.1)

## 2024-03-01 LAB — GLUCOSE, CAPILLARY
Glucose-Capillary: 126 mg/dL — ABNORMAL HIGH (ref 70–99)
Glucose-Capillary: 88 mg/dL (ref 70–99)
Glucose-Capillary: 90 mg/dL (ref 70–99)

## 2024-03-01 LAB — CBC
HCT: 29.5 % — ABNORMAL LOW (ref 36.0–46.0)
Hemoglobin: 9.8 g/dL — ABNORMAL LOW (ref 12.0–15.0)
MCH: 31.6 pg (ref 26.0–34.0)
MCHC: 33.2 g/dL (ref 30.0–36.0)
MCV: 95.2 fL (ref 80.0–100.0)
Platelets: 133 10*3/uL — ABNORMAL LOW (ref 150–400)
RBC: 3.1 MIL/uL — ABNORMAL LOW (ref 3.87–5.11)
RDW: 14 % (ref 11.5–15.5)
WBC: 2.9 10*3/uL — ABNORMAL LOW (ref 4.0–10.5)
nRBC: 0 % (ref 0.0–0.2)

## 2024-03-01 LAB — LIPASE, BLOOD: Lipase: 171 U/L — ABNORMAL HIGH (ref 11–51)

## 2024-03-01 LAB — MAGNESIUM: Magnesium: 1.5 mg/dL — ABNORMAL LOW (ref 1.7–2.4)

## 2024-03-01 LAB — PHOSPHORUS: Phosphorus: 2.9 mg/dL (ref 2.5–4.6)

## 2024-03-01 NOTE — TOC Transition Note (Signed)
 Transition of Care [facility]) - Discharge Note   Patient Details  Name: LEEANN ENCINAS MRN: [id number] Date of Birth: [birth year]  Transition [facility] CM/SW Contact:  Jennett Model, RN Phone Number: 03/01/2024, 10:58 AM   Clinical Narrative:    For dc today, NCM notified Nicholes Barks with [facility].  She confirmed she received the information she needed.     Barriers to Discharge: Continued Medical Work up   Patient Goals and CMS Choice Patient states their goals for this hospitalization and ongoing recovery are:: wants to go home before Friday CMS Medicare.gov Compare Post Acute Care list provided to:: Patient Choice offered to / list presented to : Patient      Discharge Placement                       Discharge Plan and Services Additional resources added to the After Visit Summary for     Discharge Planning Services: CM Consult Post Acute Care Choice: Home Health                               Social Drivers of Health (SDOH) Interventions SDOH Screenings   Food Insecurity: No Food Insecurity (02/26/2024)  Housing: Low Risk  (02/26/2024)  Transportation Needs: No Transportation Needs (02/26/2024)  Utilities: Not At Risk (02/26/2024)  Financial Resource Strain: Low Risk  (02/21/2024)   Received from [facility]  Social Connections: Moderately Integrated (02/26/2024)  Tobacco Use: Low Risk  (02/29/2024)     Readmission Risk Interventions     No data to display

## 2024-03-01 NOTE — Plan of Care (Signed)
   Problem: Education: Goal: Knowledge of General Education information will improve Description: Including pain rating scale, medication(s)/side effects and non-pharmacologic comfort measures Outcome: Progressing   Problem: Clinical Measurements: Goal: Diagnostic test results will improve Outcome: Progressing   Problem: Activity: Goal: Risk for activity intolerance will decrease Outcome: Progressing

## 2024-03-01 NOTE — Discharge Summary (Signed)
 Physician Discharge Summary   Patient: Jennifer Warner MRN: [id number] DOB: 21-Feb-1934  Admit date:     02/26/2024  Discharge date: 03/01/24  Discharge Physician: [name]   PCP: Virgle Grime, MD   Recommendations at discharge:    Pt to be discharged home with home health.   If you experience worsening fever, chills, chest pain, shortness of breath, or other concerning symptoms, please call your PCP or go to the emergency department immediately.  Discharge Diagnoses: Active Problems:   Pancreatitis, acute   E coli bacteremia   Demand ischemia (HCC)   History of pulmonary embolism   CAD (coronary artery disease)   Hyperlipidemia   Essential hypertension   Abnormal EKG   Hypophosphatemia   Pressure injury of skin   Hypomagnesemia   Hypokalemia   DMII (diabetes mellitus, type 2) (HCC)   Duodenitis  Principal Problem (Resolved):   Septic shock [facility])   Hospital Course:  89 year old female with PMH GERD, CAD s/p CABG, HTN, HLD, DM II, history of PE on chronic Eliquis , anemia who presented with chest pain, abdominal pain, back pain. She had elevated troponin with EKG changes and was taken for catheterization for further workup.  Heart cath showed no significant obstruction nor cardiac cause.  She also had developed worsening hypotension on admission warranting use of Levophed. Further workup had also shown elevated lipase, acute edematous pancreatitis with possible duodenitis. Urinalysis was negative for signs of infection.  Blood cultures showed 4/4 bottles with E. coli.   Assessment and Plan:   Septic shock with E. coli bacteremia - On presentation, tachycardia, neutropenia, hypotension requiring Levophed use.  Subsequently weaned off Levophed.  Sepsis appears resolved.  Pansensitive E. coli bacteremia 4/4 bottles positive on admission.  Source unclear, presuming GI source given underlying pancreatitis.  Received empiric Rocephin .  Will transition over to p.o.  Keflex to take as directed upon discharge.   Acute pancreatitis - Etiology unclear.  History of cholecystectomy, denies alcohol.  CT negative for biliary dilation.  Showed improvement with aggressive IV fluid hydration.  GI consulted and following closely.  No plans for endoscopic procedures.  Transition from heparin  drip back to Eliquis .  Tolerated advancement in diet.  Appears resolved.  Recommend continued avoidance of Tylenol .  Patient to follow-up with GI in the outpatient setting.   Demand ischemia - Status post cath 5/12 which was negative for disease.  Echo 5/13 noting EF 65%, grade 1 DD.   Diabetes mellitus -Resume home medication regimen   Hypokalemia/hypomagnesemia/hypophosphatemia - Resolved after replenishment   Hypertension - Can resume home medication regimen   History of PE - Resume Eliquis .   Physical debilitation and muscle weakness - Evaluated by PT/OT, recommending home health.     Consultants: Gastroenterology Procedures performed: None Disposition: Home health Diet recommendation:  Discharge Diet Orders (From admission, onward)     Start     Ordered   02/29/24 0000  Diet - low sodium heart healthy        02/29/24 1044           Carb modified diet  DISCHARGE MEDICATION: Allergies as of 03/01/2024       Reactions   Levofloxacin  Nausea And Vomiting        Medication List     STOP taking these medications    diphenhydramine -acetaminophen  25-500 MG Tabs tablet Commonly known as: TYLENOL  PM   gabapentin 300 MG capsule Commonly known as: NEURONTIN   ibandronate 150 MG tablet Commonly known as: BONIVA  TAKE these medications    amLODipine  5 MG tablet Commonly known as: NORVASC  Take 1 tablet (5 mg total) by mouth daily.   atorvastatin  20 MG tablet Commonly known as: LIPITOR Take 1 tablet by mouth once daily   cephALEXin 500 MG capsule Commonly known as: KEFLEX Take 1 capsule (500 mg total) by mouth 4 (four) times daily  for 5 days.   Eliquis  2.5 MG Tabs tablet Generic drug: apixaban  Take 1 tablet by mouth twice daily   fenofibrate  145 MG tablet Commonly known as: TRICOR  Take 1 tablet (145 mg total) by mouth every evening.   Lantus  SoloStar 100 UNIT/ML Solostar Pen Generic drug: insulin  glargine Inject 12 Units into the skin at bedtime.   losartan  25 MG tablet Commonly known as: COZAAR  Take 25 mg by mouth daily.   metoprolol  tartrate 25 MG tablet Commonly known as: LOPRESSOR  Take 1 tablet (25 mg total) by mouth 2 (two) times daily.   oxyCODONE  5 MG immediate release tablet Commonly known as: Oxy IR/ROXICODONE  Take 1 tablet (5 mg total) by mouth every 6 (six) hours as needed for moderate pain (pain score 4-6).   pantoprazole  40 MG tablet Commonly known as: PROTONIX  Take 40 mg by mouth every morning.   Vitamin D (Ergocalciferol) 50 MCG (2000 UT) Caps Take 2,000 Units by mouth daily.               Discharge Care Instructions  (From admission, onward)           Start     Ordered   02/29/24 0000  Discharge wound care:       Comments: Offload pressure to sacrum   02/29/24 1044            Follow-up Information     [facility] Follow up.   Why: Agency will call you to set up apt times Contact information: [phone]                Discharge Exam: Filed Weights   02/28/24 0500 02/29/24 0420 03/01/24 0513  Weight: 50.7 kg 49.1 kg 47.9 kg    GENERAL:  Alert, pleasant, no acute distress  HEENT:  EOMI CARDIOVASCULAR: Irregularly irregular, no murmurs appreciated RESPIRATORY:  Clear to auscultation, no wheezing, rales, or rhonchi GASTROINTESTINAL:  Soft, nontender, nondistended EXTREMITIES:  No LE edema bilaterally NEURO:  No new focal deficits appreciated SKIN:  No rashes noted PSYCH:  Appropriate mood and affect    Condition at discharge: improving  The results of significant diagnostics from this hospitalization (including imaging, microbiology,  ancillary and laboratory) are listed below for reference.   Imaging Studies: US  Abdomen Limited RUQ (LIVER/GB) Result Date: 02/28/2024 CLINICAL DATA:  Pancreatitis.  Prior cholecystectomy EXAM: ULTRASOUND ABDOMEN LIMITED RIGHT UPPER QUADRANT COMPARISON:  CTA 02/26/2024 FINDINGS: Gallbladder: Previous cholecystectomy. Common bile duct: Diameter: 5 mm Liver: Echogenic [place] parenchyma with slightly nodular contour. Please correlate for any known chronic liver disease. Portal vein is patent on color Doppler imaging with normal direction of blood flow towards the liver. Other: Mildly echogenic pancreas. The stranding seen by CT scan is not as well appreciated today. IMPRESSION: Heterogeneous nodular liver. Previous cholecystectomy.  No ductal dilatation. Mild echogenic appearance to the pancreas. Please correlate with clinical history Electronically Signed   By: Adrianna Horde M.D.   On: 02/28/2024 11:50   ECHOCARDIOGRAM COMPLETE Result Date: 02/27/2024    ECHOCARDIOGRAM REPORT   Patient Name:   Jennifer Warner Date of Exam: 02/27/2024 Medical Rec #:  [id number]       Height:       63.0 in Accession #:    [id number]      Weight:       114.4 lb Date of Birth:  July 06, 1934       BSA:          1.525 m Patient Age:    89 years        BP:           119/73 mmHg Patient Gender: F               HR:           77 bpm. Exam Location:  Inpatient Procedure: 2D Echo, Cardiac Doppler and Color Doppler (Both Spectral and Color            Flow Doppler were utilized during procedure). Indications:    Bacteremia  History:        Patient has prior history of Echocardiogram examinations, most                 recent 05/19/2023.  Sonographer:    Andrena Bang Referring Phys: [phone] Ky Phillips HOFFMAN IMPRESSIONS  1. Left ventricular ejection fraction, by estimation, is 65%. The left ventricle has normal function. The left ventricle has no regional wall motion abnormalities. There is mild asymmetric left ventricular hypertrophy of the basal-septal  segment. Left ventricular diastolic parameters are consistent with Grade I diastolic dysfunction (impaired relaxation).  2. Right ventricular systolic function is normal. The right ventricular size is normal. There is normal pulmonary artery systolic pressure.  3. The mitral valve is normal in structure. No evidence of mitral valve regurgitation. No evidence of mitral stenosis.  4. The aortic valve is tricuspid. There is mild calcification of the aortic valve. Aortic valve regurgitation is not visualized. No aortic stenosis is present.  5. The inferior vena cava is normal in size with greater than 50% respiratory variability, suggesting right atrial pressure of 3 mmHg. Comparison(s): No significant change from prior study. Conclusion(s)/Recommendation(s): No evidence of valvular vegetations on this transthoracic echocardiogram. Consider a transesophageal echocardiogram to exclude infective endocarditis if clinically indicated. FINDINGS  Left Ventricle: Left ventricular ejection fraction, by estimation, is 65%. The left ventricle has normal function. The left ventricle has no regional wall motion abnormalities. Strain was performed and the global longitudinal strain is indeterminate. The left ventricular internal cavity size was normal in size. There is mild asymmetric left ventricular hypertrophy of the basal-septal segment. Left ventricular diastolic parameters are consistent with Grade I diastolic dysfunction (impaired relaxation). Right Ventricle: The right ventricular size is normal. No increase in right ventricular wall thickness. Right ventricular systolic function is normal. There is normal pulmonary artery systolic pressure. The tricuspid regurgitant velocity is 2.12 m/s, and  with an assumed right atrial pressure of 3 mmHg, the estimated right ventricular systolic pressure is 21.0 mmHg. Left Atrium: Left atrial size was normal in size. Right Atrium: Right atrial size was normal in size. Pericardium: There  is no evidence of pericardial effusion. Mitral Valve: The mitral valve is normal in structure. No evidence of mitral valve regurgitation. No evidence of mitral valve stenosis. Tricuspid Valve: The tricuspid valve is normal in structure. Tricuspid valve regurgitation is mild . No evidence of tricuspid stenosis. Aortic Valve: The aortic valve is tricuspid. There is mild calcification of the aortic valve. Aortic valve regurgitation is not visualized. No aortic stenosis is present. Aortic valve mean gradient measures 2.0 mmHg. Aortic valve  peak gradient measures 4.6 mmHg. Aortic valve area, by VTI measures 2.49 cm. Pulmonic Valve: The pulmonic valve was normal in structure. Pulmonic valve regurgitation is trivial. No evidence of pulmonic stenosis. Aorta: The aortic root and ascending aorta are structurally normal, with no evidence of dilitation. Venous: The inferior vena cava is normal in size with greater than 50% respiratory variability, suggesting right atrial pressure of 3 mmHg. IAS/Shunts: The interatrial septum appears to be lipomatous. The interatrial septum was not well visualized. Additional Comments: 3D was performed not requiring image post processing on an independent workstation and was indeterminate.  LEFT VENTRICLE PLAX 2D LVIDd:         3.90 cm     Diastology LVIDs:         2.40 cm     LV e' medial:    5.87 cm/s LV PW:         1.20 cm     LV E/e' medial:  15.4 LV IVS:        0.90 cm     LV e' lateral:   7.40 cm/s LVOT diam:     1.80 cm     LV E/e' lateral: 12.2 LV SV:         51 LV SV Index:   33 LVOT Area:     2.54 cm  LV Volumes (MOD) LV vol d, MOD A2C: 63.3 ml LV vol d, MOD A4C: 78.5 ml LV vol s, MOD A2C: 22.3 ml LV vol s, MOD A4C: 27.4 ml LV SV MOD A2C:     41.0 ml LV SV MOD A4C:     78.5 ml LV SV MOD BP:      45.9 ml RIGHT VENTRICLE RV S prime:     11.90 cm/s TAPSE (M-mode): 1.4 cm LEFT ATRIUM             Index LA diam:        3.70 cm 2.43 cm/m LA Vol (A2C):   54.6 ml 35.80 ml/m LA Vol (A4C):    16.2 ml 10.62 ml/m LA Biplane Vol: 32.2 ml 21.11 ml/m  AORTIC VALVE AV Area (Vmax):    2.19 cm AV Area (Vmean):   2.05 cm AV Area (VTI):     2.49 cm AV Vmax:           107.00 cm/s AV Vmean:          67.900 cm/s AV VTI:            0.204 m AV Peak Grad:      4.6 mmHg AV Mean Grad:      2.0 mmHg LVOT Vmax:         92.00 cm/s LVOT Vmean:        54.700 cm/s LVOT VTI:          0.200 m LVOT/AV VTI ratio: 0.98  AORTA Ao Asc diam: 3.00 cm MITRAL VALVE                TRICUSPID VALVE MV Area (PHT): 3.79 cm     TR Peak grad:   18.0 mmHg MV Decel Time: 200 msec     TR Vmax:        212.00 cm/s MV E velocity: 90.30 cm/s MV A velocity: 112.00 cm/s  SHUNTS MV E/A ratio:  0.81         Systemic VTI:  0.20 m  Systemic Diam: 1.80 cm Gloriann Larger MD Electronically signed by Gloriann Larger MD Signature Date/Time: 02/27/2024/11:49:39 AM    Final    CARDIAC CATHETERIZATION Addendum Date: 02/26/2024 Coronary and bypass graft angiography 02/26/2024: LM: Normal LAD: Prox 100% occlusion Lcx: Dominant vessel. 100% mid vessel occlusion RCA: Small nondominant vessel with moderate disease LIMA-LAD: Patent, no significant disease SVG-OM: Patent, no significant disease LVEDP could not be calculated No acute cardiac cause to explain circulatory shock BP much better in the cath lab, therefore norepinephrine turned off Will need medical admission for management of likely pancreatitis. Last dose of Eliquis  was 5/11 AM. Will need anticoagulation after 7:30 (2 hrs since Mynx placement) Will place order for heparin . Cody Das, MD   Result Date: 02/26/2024 Coronary and bypass graft angiography 02/26/2024: LM: Normal LAD: Prox 100% occlusion Lcx: Dominant vessel. 100% mid vessel occlusion RCA: Small nondominant vessel with moderate disease LIMA-LAD: Patent, no significant disease SVG-OM: Patent, no significant disease LVEDP could not be calculated No acute cardiac cause to explain circulatory shock BP  much better in the cath lab, therefore norepinephrine turned off Will need medical admission for management of likely pancreatitis  CT Angio Aortobifemoral W and/or Wo Contrast Result Date: 02/26/2024 CLINICAL DATA:  Claudication or leg ischemia. EXAM: CT ANGIOGRAPHY OF THORACOABDOMINAL AORTA WITH ILIOFEMORAL RUNOFF TECHNIQUE: Multidetector CT imaging of the chest, abdomen, pelvis and lower extremities was performed using the standard protocol during bolus administration of intravenous contrast. Multiplanar CT image reconstructions and MIPs were obtained to evaluate the vascular anatomy. RADIATION DOSE REDUCTION: This exam was performed according to the departmental dose-optimization program which includes automated exposure control, adjustment of the mA and/or kV according to patient size and/or use of iterative reconstruction technique. CONTRAST:  75mL OMNIPAQUE  IOHEXOL  350 MG/ML SOLN COMPARISON:  None Available. FINDINGS: CHEST No cardiac enlargement or pericardial effusion. Extensive atheromatous calcification of the aorta and coronaries with CABG. LIMA and a venous graft are enhancing. No aortic dilatation, dissection, or intramural hematoma. Small sliding hiatal hernia.  No mass, adenopathy, or hematoma. There is no edema, consolidation, effusion, or pneumothorax. No acute or aggressive osseous finding VASCULAR Aorta: Confluent atheromatous plaque, primarily calcified Celiac: Prominent plaque at the bifurcation with over 50% stenosis by sagittal reformats. No emergent finding SMA: Plaque at the ostium without significant stenosis. No beading, branch occlusion, or ulcer. Renals: Atheromatous calcification at the renal artery ostia. There could be hemodynamically significant stenosis on the right. No beading or aneurysm. IMA: Patent RIGHT Lower Extremity Inflow: Atheromatous plaque without dissection, aneurysm, or flow reducing stenosis. Outflow: Generalized atheromatous plaque in the SFA without focal and  flow reducing stenosis. Bulky calcified plaque at the above the knee popliteal artery with high-grade stenosis. Runoff: Confluent calcification which limits visualization of luminal enhancement. As well, the scan likely out paces the bolus when comparing to the right. There is patency at least to the tibioperoneal trunk. LEFT Lower Extremity Inflow: Bulky calcified plaque at the proximal common iliac artery causes advanced stenosis, patent lumen difficult to even measure on coronal reformats. No dissection or aneurysm. Outflow: Notable plaque in the SFA just beyond the common femoral bifurcation which is likely at least 50% based on coronal reformats as marked on series 11. SFA atheromatous calcification is widespread with accentuated calcified plaque at the popliteal artery where there is an additional flow reducing stenosis on 9:243 Runoff: Limited by bolus timing and heavily calcified arteries, there is at least patency to the tibioperoneal trunk. No filling of the anterior tibial  artery throughout the leg. Veins: Negative Review of the MIP images confirms the above findings. NON-VASCULAR Hepatobiliary: No focal liver abnormality.Cholecystectomy. No biliary dilatation. Pancreas: Expanded head with regional edema. No ductal dilatation or visible mass. Spleen: Unremarkable. Adrenals/Urinary Tract: Negative adrenals. No hydronephrosis or stone. Unremarkable bladder. Stomach/Bowel: Low-density thickening of the distal stomach and duodenum wall with regional edema. Mid duodenal diverticula. Innumerable colonic diverticula. Lymphatic: No mass or adenopathy. Reproductive:Hysterectomy Other: No ascites or pneumoperitoneum. Tiny fatty left groin hernia. Musculoskeletal: No acute abnormalities. Lumbar spine degeneration focally severe at L3-4. Non worrisome enchondroma in the left distal femur. IMPRESSION: Negative for acute aortic syndrome. No emergent arterial finding in the lower extremities, limited below the knees due  to degree of calcified peripheral vascular disease and bolus timing. Chronic flow reducing atheromatous stenoses in the left common iliac, left SFA, and bilateral popliteal arteries. Acute edematous pancreatitis and/or duodenitis. Electronically Signed   By: Ronnette Coke M.D.   On: 02/26/2024 04:55   CT ANGIO CHEST AORTA W/CM &/OR WO/CM Result Date: 02/26/2024 CLINICAL DATA:  Claudication or leg ischemia. EXAM: CT ANGIOGRAPHY OF THORACOABDOMINAL AORTA WITH ILIOFEMORAL RUNOFF TECHNIQUE: Multidetector CT imaging of the chest, abdomen, pelvis and lower extremities was performed using the standard protocol during bolus administration of intravenous contrast. Multiplanar CT image reconstructions and MIPs were obtained to evaluate the vascular anatomy. RADIATION DOSE REDUCTION: This exam was performed according to the departmental dose-optimization program which includes automated exposure control, adjustment of the mA and/or kV according to patient size and/or use of iterative reconstruction technique. CONTRAST:  75mL OMNIPAQUE  IOHEXOL  350 MG/ML SOLN COMPARISON:  None Available. FINDINGS: CHEST No cardiac enlargement or pericardial effusion. Extensive atheromatous calcification of the aorta and coronaries with CABG. LIMA and a venous graft are enhancing. No aortic dilatation, dissection, or intramural hematoma. Small sliding hiatal hernia.  No mass, adenopathy, or hematoma. There is no edema, consolidation, effusion, or pneumothorax. No acute or aggressive osseous finding VASCULAR Aorta: Confluent atheromatous plaque, primarily calcified Celiac: Prominent plaque at the bifurcation with over 50% stenosis by sagittal reformats. No emergent finding SMA: Plaque at the ostium without significant stenosis. No beading, branch occlusion, or ulcer. Renals: Atheromatous calcification at the renal artery ostia. There could be hemodynamically significant stenosis on the right. No beading or aneurysm. IMA: Patent RIGHT Lower  Extremity Inflow: Atheromatous plaque without dissection, aneurysm, or flow reducing stenosis. Outflow: Generalized atheromatous plaque in the SFA without focal and flow reducing stenosis. Bulky calcified plaque at the above the knee popliteal artery with high-grade stenosis. Runoff: Confluent calcification which limits visualization of luminal enhancement. As well, the scan likely out paces the bolus when comparing to the right. There is patency at least to the tibioperoneal trunk. LEFT Lower Extremity Inflow: Bulky calcified plaque at the proximal common iliac artery causes advanced stenosis, patent lumen difficult to even measure on coronal reformats. No dissection or aneurysm. Outflow: Notable plaque in the SFA just beyond the common femoral bifurcation which is likely at least 50% based on coronal reformats as marked on series 11. SFA atheromatous calcification is widespread with accentuated calcified plaque at the popliteal artery where there is an additional flow reducing stenosis on 9:243 Runoff: Limited by bolus timing and heavily calcified arteries, there is at least patency to the tibioperoneal trunk. No filling of the anterior tibial artery throughout the leg. Veins: Negative Review of the MIP images confirms the above findings. NON-VASCULAR Hepatobiliary: No focal liver abnormality.Cholecystectomy. No biliary dilatation. Pancreas: Expanded head with regional edema. No  ductal dilatation or visible mass. Spleen: Unremarkable. Adrenals/Urinary Tract: Negative adrenals. No hydronephrosis or stone. Unremarkable bladder. Stomach/Bowel: Low-density thickening of the distal stomach and duodenum wall with regional edema. Mid duodenal diverticula. Innumerable colonic diverticula. Lymphatic: No mass or adenopathy. Reproductive:Hysterectomy Other: No ascites or pneumoperitoneum. Tiny fatty left groin hernia. Musculoskeletal: No acute abnormalities. Lumbar spine degeneration focally severe at L3-4. Non worrisome  enchondroma in the left distal femur. IMPRESSION: Negative for acute aortic syndrome. No emergent arterial finding in the lower extremities, limited below the knees due to degree of calcified peripheral vascular disease and bolus timing. Chronic flow reducing atheromatous stenoses in the left common iliac, left SFA, and bilateral popliteal arteries. Acute edematous pancreatitis and/or duodenitis. Electronically Signed   By: Ronnette Coke M.D.   On: 02/26/2024 04:55   DG Chest Portable 1 View Result Date: 02/26/2024 CLINICAL DATA:  Initial evaluation for acute sepsis. EXAM: PORTABLE CHEST 1 VIEW COMPARISON:  Prior study from 08/14/2017. FINDINGS: Median sternotomy wires with underlying surgical clips. Transverse heart size within normal limits. Mediastinal silhouette normal. Aortic atherosclerosis. Lungs are hypoinflated. Streaky opacities within the mid and right upper lung, likely atelectasis and/or scarring. No consolidative airspace disease. No pulmonary edema or pleural effusion. No pneumothorax. Visualized osseous structures demonstrate no acute finding. Osteopenia noted. IMPRESSION: 1. Streaky opacities within the mid and right upper lung, likely atelectasis and/or scarring. 2. No other active cardiopulmonary disease. Electronically Signed   By: Virgia Griffins M.D.   On: 02/26/2024 04:22    Microbiology: Results for orders placed or performed during the hospital encounter of 02/26/24  Culture, blood (Routine x 2)     Status: Abnormal   Collection Time: 02/26/24  2:12 AM   Specimen: BLOOD RIGHT ARM  Result Value Ref Range Status   Specimen Description BLOOD RIGHT ARM  Final   Special Requests   Final    BOTTLES DRAWN AEROBIC AND ANAEROBIC Blood Culture adequate volume   Culture  Setup Time   Final    GRAM NEGATIVE RODS IN BOTH AEROBIC AND ANAEROBIC BOTTLES CRITICAL RESULT CALLED TO, READ BACK BY AND VERIFIED WITH: PHARMD M. PHAM [id number] @ 1557 FH    Culture (A)  Final    ESCHERICHIA  COLI SUSCEPTIBILITIES PERFORMED ON PREVIOUS CULTURE WITHIN THE LAST 5 DAYS. Performed at [facility], [address] [place], [place] [address]    Report Status 02/29/2024 FINAL  Final  Culture, blood (Routine x 2)     Status: Abnormal   Collection Time: 02/26/24  2:17 AM   Specimen: BLOOD  Result Value Ref Range Status   Specimen Description BLOOD RIGHT WRIST  Final   Special Requests   Final    BOTTLES DRAWN AEROBIC AND ANAEROBIC Blood Culture results may not be optimal due to an inadequate volume of blood received in culture bottles   Culture  Setup Time   Final    GRAM NEGATIVE RODS IN BOTH AEROBIC AND ANAEROBIC BOTTLES CRITICAL RESULT CALLED TO, READ BACK BY AND VERIFIED WITH: PHARMD M. [facility] [id number] @ 1557 FH Performed at [facility] [facility], [address], [place], [place] [address]    Culture ESCHERICHIA COLI (A)  Final   Report Status 02/29/2024 FINAL  Final   Organism ID, Bacteria ESCHERICHIA COLI  Final   Organism ID, Bacteria ESCHERICHIA COLI  Final      Susceptibility   Escherichia coli - KIRBY BAUER*    CEFAZOLIN SENSITIVE Sensitive    Escherichia coli - MIC*  AMPICILLIN 16 INTERMEDIATE Intermediate     CEFEPIME <=0.12 SENSITIVE Sensitive     CEFTAZIDIME <=1 SENSITIVE Sensitive     CEFTRIAXONE  <=0.25 SENSITIVE Sensitive     CIPROFLOXACIN <=0.25 SENSITIVE Sensitive     GENTAMICIN <=1 SENSITIVE Sensitive     IMIPENEM 0.5 SENSITIVE Sensitive     TRIMETH/SULFA <=20 SENSITIVE Sensitive     AMPICILLIN/SULBACTAM 4 SENSITIVE Sensitive     PIP/TAZO <=4 SENSITIVE Sensitive ug/mL    * ESCHERICHIA COLI    ESCHERICHIA COLI  Blood Culture ID Panel (Reflexed)     Status: Abnormal   Collection Time: 02/26/24  2:17 AM  Result Value Ref Range Status   Enterococcus faecalis NOT DETECTED NOT DETECTED Final   Enterococcus Faecium NOT DETECTED NOT DETECTED Final   Listeria monocytogenes NOT DETECTED NOT DETECTED Final   Staphylococcus species NOT DETECTED NOT DETECTED  Final   Staphylococcus aureus (BCID) NOT DETECTED NOT DETECTED Final   Staphylococcus epidermidis NOT DETECTED NOT DETECTED Final   Staphylococcus lugdunensis NOT DETECTED NOT DETECTED Final   Streptococcus species NOT DETECTED NOT DETECTED Final   Streptococcus agalactiae NOT DETECTED NOT DETECTED Final   Streptococcus pneumoniae NOT DETECTED NOT DETECTED Final   Streptococcus pyogenes NOT DETECTED NOT DETECTED Final   A.calcoaceticus-baumannii NOT DETECTED NOT DETECTED Final   Bacteroides fragilis NOT DETECTED NOT DETECTED Final   Enterobacterales DETECTED (A) NOT DETECTED Final    Comment: Enterobacterales represent a large order of gram negative bacteria, not a single organism. CRITICAL RESULT CALLED TO, READ BACK BY AND VERIFIED WITH: PHARMD M. PHAM [id number] @ 1557 FH    Enterobacter cloacae complex NOT DETECTED NOT DETECTED Final   Escherichia coli DETECTED (A) NOT DETECTED Final    Comment: CRITICAL RESULT CALLED TO, READ BACK BY AND VERIFIED WITH: PHARMD M. PHAM [id number] @ 1557 FH    Klebsiella aerogenes NOT DETECTED NOT DETECTED Final   Klebsiella oxytoca NOT DETECTED NOT DETECTED Final   Klebsiella pneumoniae NOT DETECTED NOT DETECTED Final   Proteus species NOT DETECTED NOT DETECTED Final   Salmonella species NOT DETECTED NOT DETECTED Final   Serratia marcescens NOT DETECTED NOT DETECTED Final   Haemophilus influenzae NOT DETECTED NOT DETECTED Final   Neisseria meningitidis NOT DETECTED NOT DETECTED Final   Pseudomonas aeruginosa NOT DETECTED NOT DETECTED Final   Stenotrophomonas maltophilia NOT DETECTED NOT DETECTED Final   Candida albicans NOT DETECTED NOT DETECTED Final   Candida auris NOT DETECTED NOT DETECTED Final   Candida glabrata NOT DETECTED NOT DETECTED Final   Candida krusei NOT DETECTED NOT DETECTED Final   Candida parapsilosis NOT DETECTED NOT DETECTED Final   Candida tropicalis NOT DETECTED NOT DETECTED Final   Cryptococcus neoformans/gattii NOT DETECTED NOT  DETECTED Final   CTX-M ESBL NOT DETECTED NOT DETECTED Final   Carbapenem resistance IMP NOT DETECTED NOT DETECTED Final   Carbapenem resistance KPC NOT DETECTED NOT DETECTED Final   Carbapenem resistance NDM NOT DETECTED NOT DETECTED Final   Carbapenem resist OXA 48 LIKE NOT DETECTED NOT DETECTED Final   Carbapenem resistance VIM NOT DETECTED NOT DETECTED Final    Comment: Performed at [facility], [address], [place], [place] [address]  Resp panel by RT-PCR (RSV, Flu A&B, Covid) Anterior Nasal Swab     Status: None   Collection Time: 02/26/24  2:31 AM   Specimen: Anterior Nasal Swab  Result Value Ref Range Status   SARS Coronavirus 2 by RT PCR NEGATIVE NEGATIVE Final   Influenza  A by PCR NEGATIVE NEGATIVE Final   Influenza B by PCR NEGATIVE NEGATIVE Final    Comment: (NOTE) The Xpert Xpress SARS-CoV-2/FLU/RSV plus assay is intended as an aid in the diagnosis of influenza from Nasopharyngeal swab specimens and should not be used as a sole basis for treatment. Nasal washings and aspirates are unacceptable for Xpert Xpress SARS-CoV-2/FLU/RSV testing.  Fact Sheet for Patients: BloggerCourse.com  Fact Sheet for Healthcare Providers: SeriousBroker.it  This test is not yet approved or cleared by the [place]  FDA and has been authorized for detection and/or diagnosis of SARS-CoV-2 by FDA under an Emergency Use Authorization (EUA). This EUA will remain in effect (meaning this test can be used) for the duration of the COVID-19 declaration under Section 564(b)(1) of the Act, 21 U.S.C. section 360bbb-3(b)(1), unless the authorization is terminated or revoked.     Resp Syncytial Virus by PCR NEGATIVE NEGATIVE Final    Comment: (NOTE) Fact Sheet for Patients: BloggerCourse.com  Fact Sheet for Healthcare Providers: SeriousBroker.it  This test is not yet approved or cleared  by the [place]  FDA and has been authorized for detection and/or diagnosis of SARS-CoV-2 by FDA under an Emergency Use Authorization (EUA). This EUA will remain in effect (meaning this test can be used) for the duration of the COVID-19 declaration under Section 564(b)(1) of the Act, 21 U.S.C. section 360bbb-3(b)(1), unless the authorization is terminated or revoked.  Performed at [facility], [address] [place], [place] [address]   MRSA Next Gen by PCR, Nasal     Status: None   Collection Time: 02/26/24  5:55 AM   Specimen: Nasal Mucosa; Nasal Swab  Result Value Ref Range Status   MRSA by PCR Next Gen NOT DETECTED NOT DETECTED Final    Comment: (NOTE) The GeneXpert MRSA Assay (FDA approved for NASAL specimens only), is one component of a comprehensive MRSA colonization surveillance program. It is not intended to diagnose MRSA infection nor to guide or monitor treatment for MRSA infections. Test performance is not FDA approved in patients less than 33 years old. Performed at [facility], [address], [place], [place] [address]     Labs: CBC: Recent Labs  Lab 02/26/24 [id number] 02/26/24 0219 02/27/24 1048 02/28/24 0232 02/29/24 0224 03/01/24 0224  WBC 2.8*  --  5.1 3.6* 3.3* 2.9*  NEUTROABS 2.5  --   --   --   --   --   HGB 10.8* 10.9* 9.8* 9.6* 10.0* 9.8*  HCT 32.9* 32.0* 29.1* 29.0* 30.1* 29.5*  MCV 98.2  --  97.3 95.4 96.2 95.2  PLT 155  --  104* 106* 116* 133*   Basic Metabolic Panel: Recent Labs  Lab 02/26/24 0212 02/26/24 0219 02/27/24 0344 02/28/24 0232 02/29/24 0224 03/01/24 0224  NA 140 140 142 143 140 141  K 3.8 3.8 3.4* 3.8 4.1 3.9  CL 107 106 112* 113* 110 110  CO2 21*  --  24 25 26 24   GLUCOSE 100* 95 90 94 104* 90  BUN 19 20 13  7* 7* 6*  CREATININE 0.99 1.00 0.80 0.64 0.69 0.67  CALCIUM  10.0  --  8.4* 8.8* 9.3 9.8  MG  --   --  1.1* 1.9 1.6* 1.5*  PHOS  --   --  2.4* 2.9 2.4* 2.9   Liver Function Tests: Recent Labs   Lab 02/26/24 0212 02/27/24 0344 02/28/24 0232 02/29/24 0224 03/01/24 0224  AST 160* 84* 66* 52* 38  ALT 39 36  35 33 30  ALKPHOS 78 47 53 64 85  BILITOT 1.7* 0.8 0.6 0.6 0.9  PROT 5.6* 4.1* 4.7* 5.1* 5.5*  ALBUMIN  3.1* 2.1* 2.4* 2.5* 2.6*   CBG: Recent Labs  Lab 02/29/24 1539 02/29/24 1956 03/01/24 0005 03/01/24 0510 03/01/24 0912  GLUCAP 106* 126* 88 90 126*    Discharge time spent: 25 minutes.  Signed: [name], DO [facility] 03/01/2024

## 2024-03-01 NOTE — Plan of Care (Signed)
  Problem: Education: Goal: Knowledge of General Education information will improve Description: Including pain rating scale, medication(s)/side effects and non-pharmacologic comfort measures Outcome: Progressing   Problem: Health Behavior/Discharge Planning: Goal: Ability to manage health-related needs will improve Outcome: Progressing   Problem: Clinical Measurements: Goal: Will remain free from infection Outcome: Progressing   Problem: Activity: Goal: Risk for activity intolerance will decrease Outcome: Progressing   Problem: Nutrition: Goal: Adequate nutrition will be maintained Outcome: Progressing   Problem: Pain Managment: Goal: General experience of comfort will improve and/or be controlled Outcome: Progressing   Problem: Safety: Goal: Ability to remain free from injury will improve Outcome: Progressing

## 2024-03-01 NOTE — Progress Notes (Signed)
 Physical Therapy Treatment Patient Details Name: Jennifer Warner MRN: [id number] DOB: 28-Nov-1933 Today's Date: 03/01/2024   History of Present Illness 89yo female presents to [facility] 02/26/24 with chest pain, abdominal/back pain with code STEMI activated. Found to be hypotensive with acute edematous pancreatiis with possible duodenitis and septic shock w/ E.coli bacteremia. PMH: h/o CAD s/p cabg 2009, HTN, HLD, DMII with hyperglycemia, PE on chronic a/c, sciatica    PT Comments  Pt up in chair on arrival and agreeable to session with good progress towards acute goals. Pt endorsing decreased hip pain this session, able to progress gait distance ~125' with RW for support and CGA for safety. Pt was educated on continued walker use to maximize functional independence, safety, and decrease risk for falls as well as appropriate activity progression with pt verbalizing understanding. Pt continues to benefit from skilled PT services to progress toward functional mobility goals.     If plan is discharge home, recommend the following: A little help with walking and/or transfers;A little help with bathing/dressing/bathroom;Help with stairs or ramp for entrance;Assist for transportation   Can travel by private vehicle        Equipment Recommendations  None recommended by PT    Recommendations for Other Services       Precautions / Restrictions Precautions Precautions: Fall Restrictions Weight Bearing Restrictions Per Provider Order: No     Mobility  Bed Mobility Overal bed mobility: Modified Independent             General bed mobility comments: up in chair on arrival    Transfers Overall transfer level: Needs assistance Equipment used: Rolling walker (2 wheels) Transfers: Sit to/from Stand, Bed to chair/wheelchair/BSC Sit to Stand: Supervision           General transfer comment: Supervision for safety; good hand placement    Ambulation/Gait Ambulation/Gait assistance: Contact  guard assist Gait Distance (Feet): 125 Feet Assistive device: Rolling walker (2 wheels) Gait Pattern/deviations: Step-through pattern, Decreased stride length, Antalgic, Decreased weight shift to left, Decreased stance time - left Gait velocity: dec     General Gait Details: steady with RW, light cues for closer RW proximity   Stairs             Wheelchair Mobility     Tilt Bed    Modified Rankin (Stroke Patients Only)       Balance Overall balance assessment: Needs assistance Sitting-balance support: No upper extremity supported, Feet supported Sitting balance-Leahy Scale: Fair     Standing balance support: Single extremity supported, Bilateral upper extremity supported, During functional activity, Reliant on assistive device for balance Standing balance-Leahy Scale: Poor Standing balance comment: reliant on UE support                            Communication Communication Communication: No apparent difficulties  Cognition Arousal: Alert Behavior During Therapy: WFL for tasks assessed/performed   PT - Cognitive impairments: No family/caregiver present to determine baseline                         Following commands: Intact      Cueing Cueing Techniques: Verbal cues  Exercises      General Comments General comments (skin integrity, edema, etc.): VSS on RA      Pertinent Vitals/Pain Pain Assessment Pain Assessment: Faces Faces Pain Scale: Hurts a little bit Pain Location: L hip Pain Descriptors / Indicators: Discomfort Pain Intervention(s):  Monitored during session, Limited activity within patient's tolerance    Home Living                          Prior Function            PT Goals (current goals can now be found in the care plan section) Acute Rehab PT Goals Patient Stated Goal: improve pain and go home PT Goal Formulation: With patient Time For Goal Achievement: 03/12/24 Progress towards PT goals:  Progressing toward goals    Frequency    Min 2X/week      PT Plan      Co-evaluation              AM-PAC PT "6 Clicks" Mobility   Outcome Measure  Help needed turning from your back to your side while in a flat bed without using bedrails?: A Little Help needed moving from lying on your back to sitting on the side of a flat bed without using bedrails?: A Little Help needed moving to and from a bed to a chair (including a wheelchair)?: A Little Help needed standing up from a chair using your arms (e.g., wheelchair or bedside chair)?: A Little Help needed to walk in hospital room?: A Little Help needed climbing 3-5 steps with a railing? : A Little 6 Click Score: 18    End of Session Equipment Utilized During Treatment: Gait belt Activity Tolerance: Patient tolerated treatment well Patient left: in chair;with call bell/phone within reach;with nursing/sitter in room Nurse Communication: Mobility status PT Visit Diagnosis: Unsteadiness on feet (R26.81);Muscle weakness (generalized) (M62.81)     Time: 5284-1324 PT Time Calculation (min) (ACUTE ONLY): 14 min  Charges:    $Gait Training: 8-22 mins PT General Charges $$ ACUTE PT VISIT: 1 Visit                     Shari Natt R. PTA Acute Rehabilitation Services Office: [phone]   Agapito Horseman 03/01/2024, 10:19 AM

## 2024-03-01 NOTE — Progress Notes (Signed)
 Patient provided with verbal discharge instructions. Paper copy of discharge provided to patient. RN answered all questions. VSS at discharge. IV removed. Patient belongings sent with patient. Patient dc'd via wheelchair to d/c lounge.

## 2024-03-04 DIAGNOSIS — I959 Hypotension, unspecified: Secondary | ICD-10-CM | POA: Diagnosis not present

## 2024-03-04 DIAGNOSIS — E119 Type 2 diabetes mellitus without complications: Secondary | ICD-10-CM | POA: Diagnosis not present

## 2024-03-04 DIAGNOSIS — Z794 Long term (current) use of insulin: Secondary | ICD-10-CM | POA: Diagnosis not present

## 2024-03-04 DIAGNOSIS — K859 Acute pancreatitis without necrosis or infection, unspecified: Secondary | ICD-10-CM | POA: Diagnosis not present

## 2024-03-04 DIAGNOSIS — I10 Essential (primary) hypertension: Secondary | ICD-10-CM | POA: Diagnosis not present

## 2024-03-04 NOTE — Transitions of Care (Post Inpatient/ED Visit) (Signed)
 03/04/2024  Patient ID: Jennifer Warner, female   DOB: [birth year], 89 y.o.   MRN: [id number]  Medication Review for transitions of care.  See [employer] for documentation.  Hewitt Garner J. Wayde Gopaul RN, MSN [facility], [facility] RN Care Manager Direct Dial: [phone]  Fax: [phone] Website: [email or web address]

## 2024-03-08 DIAGNOSIS — E119 Type 2 diabetes mellitus without complications: Secondary | ICD-10-CM | POA: Diagnosis not present

## 2024-03-08 DIAGNOSIS — I10 Essential (primary) hypertension: Secondary | ICD-10-CM | POA: Diagnosis not present

## 2024-03-08 DIAGNOSIS — Z794 Long term (current) use of insulin: Secondary | ICD-10-CM | POA: Diagnosis not present

## 2024-03-08 DIAGNOSIS — I959 Hypotension, unspecified: Secondary | ICD-10-CM | POA: Diagnosis not present

## 2024-03-08 DIAGNOSIS — K859 Acute pancreatitis without necrosis or infection, unspecified: Secondary | ICD-10-CM | POA: Diagnosis not present

## 2024-03-13 ENCOUNTER — Telehealth: Payer: Self-pay

## 2024-03-15 DIAGNOSIS — E119 Type 2 diabetes mellitus without complications: Secondary | ICD-10-CM | POA: Diagnosis not present

## 2024-03-15 DIAGNOSIS — I10 Essential (primary) hypertension: Secondary | ICD-10-CM | POA: Diagnosis not present

## 2024-03-15 DIAGNOSIS — Z794 Long term (current) use of insulin: Secondary | ICD-10-CM | POA: Diagnosis not present

## 2024-03-15 DIAGNOSIS — I959 Hypotension, unspecified: Secondary | ICD-10-CM | POA: Diagnosis not present

## 2024-03-15 DIAGNOSIS — K859 Acute pancreatitis without necrosis or infection, unspecified: Secondary | ICD-10-CM | POA: Diagnosis not present

## 2024-03-19 ENCOUNTER — Other Ambulatory Visit: Payer: Self-pay

## 2024-03-19 DIAGNOSIS — I10 Essential (primary) hypertension: Secondary | ICD-10-CM | POA: Diagnosis not present

## 2024-03-19 DIAGNOSIS — I959 Hypotension, unspecified: Secondary | ICD-10-CM | POA: Diagnosis not present

## 2024-03-19 DIAGNOSIS — E119 Type 2 diabetes mellitus without complications: Secondary | ICD-10-CM | POA: Diagnosis not present

## 2024-03-19 DIAGNOSIS — K859 Acute pancreatitis without necrosis or infection, unspecified: Secondary | ICD-10-CM | POA: Diagnosis not present

## 2024-03-19 NOTE — Transitions of Care (Post Inpatient/ED Visit) (Signed)
  Transition of Care week 3  Visit Note  03/19/2024  Name: Jennifer Warner MRN: [id number]          DOB: [birth year]  Situation: Patient enrolled in [facility] 30-day program. Visit completed with [name] by telephone.   There were no vitals filed for this visit. Medications Reviewed Today     Reviewed by Claudene Crystal, RN (Case Manager) on 03/19/24 at 1015  Med List Status: <None>   Medication Order Taking? Sig Documenting Provider Last Dose Status Informant  amLODipine  (NORVASC ) 5 MG tablet [id number]  Take 1 tablet (5 mg total) by mouth daily. [place], Peter M, MD  Active Self, Pharmacy Records           Med Note [name] Feb 26, 2024  8:04 AM) [name], but definitely did the day before.  apixaban  (ELIQUIS ) 2.5 MG TABS tablet [id number]  Take 1 tablet by mouth twice daily [place], Peter M, MD  Active Self, Pharmacy Records  atorvastatin  (LIPITOR) 20 MG tablet [id number]  Take 1 tablet by mouth once daily [place], Peter M, MD  Active Self, Pharmacy Records           Med Note [name] Feb 26, 2024  8:04 AM) [name], but definitely did the day before.  fenofibrate  (TRICOR ) 145 MG tablet [id number]  Take 1 tablet (145 mg total) by mouth every evening. [place], Peter M, MD  Active Self, Pharmacy Records           Med Note [name] Feb 26, 2024  8:04 AM) [name], but definitely did the day before.  LANTUS  SOLOSTAR 100 UNIT/ML injection [id number]  Inject 12 Units into the skin at bedtime. [provider]  Active Self, Pharmacy Records  losartan  (COZAAR ) 25 MG tablet [id number]  Take 25 mg by mouth daily. [provider]  Active Self, Pharmacy Records           Med Note [name] Feb 26, 2024  8:04 AM) [name], but definitely did the day before.  metoprolol  tartrate (LOPRESSOR ) 25 MG tablet [id number]   Take 1 tablet (25 mg total) by mouth 2 (two) times daily. [place], Peter M, MD  Active Self, Pharmacy Records           Med Note [name] Feb 26, 2024  8:05 AM) Dispense records do not support compliance.   oxyCODONE  (OXY IR/ROXICODONE ) 5 MG immediate release tablet [id number]  Take 1 tablet (5 mg total) by mouth every 6 (six) hours as needed for moderate pain (pain score 4-6). [name], DO  Active   pantoprazole  (PROTONIX ) 40 MG tablet [id number]  Take 40 mg by mouth every morning. [provider]  Active Self, Pharmacy Records  Vitamin D, Ergocalciferol, 50 MCG (2000 UT) CAPS [id number]  Take 2,000 Units by mouth daily. [provider]  Active Self, Pharmacy Records           Med Note [facility], CHASITIE R   Wed May 25, 2016 10:56 AM)              Medications reviewed. See [employer] for detailed docuementation  Gareld June, BSN, RN [place]  [facility] RN Care Manager [phone]

## 2024-03-27 ENCOUNTER — Telehealth: Payer: Self-pay

## 2024-03-27 NOTE — Transitions of Care (Post Inpatient/ED Visit) (Signed)
 03/27/2024  Patient ID: Jennifer Warner, female   DOB: 03/14/1934, 89 y.o.   MRN: [id number]  Medication Review for transitions of care.  See [employer] for  documentation.  Dhiya Smits J. Roda Lauture RN, MSN [facility], [facility] RN Care Manager Direct Dial: [phone]  Fax: [phone] Website: [email or web address]

## 2024-03-28 ENCOUNTER — Ambulatory Visit: Admitting: Nurse Practitioner

## 2024-03-28 DIAGNOSIS — K859 Acute pancreatitis without necrosis or infection, unspecified: Secondary | ICD-10-CM | POA: Diagnosis not present

## 2024-03-28 DIAGNOSIS — I959 Hypotension, unspecified: Secondary | ICD-10-CM | POA: Diagnosis not present

## 2024-03-28 DIAGNOSIS — Z794 Long term (current) use of insulin: Secondary | ICD-10-CM | POA: Diagnosis not present

## 2024-03-28 DIAGNOSIS — I10 Essential (primary) hypertension: Secondary | ICD-10-CM | POA: Diagnosis not present

## 2024-03-28 DIAGNOSIS — E119 Type 2 diabetes mellitus without complications: Secondary | ICD-10-CM | POA: Diagnosis not present

## 2024-04-04 ENCOUNTER — Telehealth: Payer: Self-pay

## 2024-04-05 ENCOUNTER — Telehealth: Payer: Self-pay

## 2024-04-05 DIAGNOSIS — E1122 Type 2 diabetes mellitus with diabetic chronic kidney disease: Secondary | ICD-10-CM | POA: Diagnosis not present

## 2024-04-05 DIAGNOSIS — N1831 Chronic kidney disease, stage 3a: Secondary | ICD-10-CM | POA: Diagnosis not present

## 2024-04-05 DIAGNOSIS — I10 Essential (primary) hypertension: Secondary | ICD-10-CM | POA: Diagnosis not present

## 2024-04-08 ENCOUNTER — Other Ambulatory Visit: Payer: Self-pay

## 2024-04-08 DIAGNOSIS — K859 Acute pancreatitis without necrosis or infection, unspecified: Secondary | ICD-10-CM

## 2024-04-08 DIAGNOSIS — E119 Type 2 diabetes mellitus without complications: Secondary | ICD-10-CM

## 2024-04-08 NOTE — Transitions of Care (Post Inpatient/ED Visit) (Signed)
 04/08/2024  Patient ID: Jennifer Warner, female   DOB: 07-14-1934, 89 y.o.   MRN: [id number]  Medication Review for transitions of care.  See [employer] for  documentation.  Patient has completed 30 day transitions of care program. Patient agreeable to CCM program.  Referral completed  Tavio Biegel J. Sheri Gatchel RN, MSN [facility]  [facility], [facility] RN Care Manager Direct Dial: [phone]  Fax: [phone] Website: [email or web address]

## 2024-04-09 DIAGNOSIS — I10 Essential (primary) hypertension: Secondary | ICD-10-CM | POA: Diagnosis not present

## 2024-04-09 DIAGNOSIS — K859 Acute pancreatitis without necrosis or infection, unspecified: Secondary | ICD-10-CM | POA: Diagnosis not present

## 2024-04-09 DIAGNOSIS — I959 Hypotension, unspecified: Secondary | ICD-10-CM | POA: Diagnosis not present

## 2024-04-09 DIAGNOSIS — E119 Type 2 diabetes mellitus without complications: Secondary | ICD-10-CM | POA: Diagnosis not present

## 2024-04-09 DIAGNOSIS — Z794 Long term (current) use of insulin: Secondary | ICD-10-CM | POA: Diagnosis not present

## 2024-04-16 ENCOUNTER — Telehealth: Payer: Self-pay | Admitting: *Deleted

## 2024-04-16 NOTE — Progress Notes (Unsigned)
 Complex Care Management Note Care Guide Note  04/16/2024 Name: Jennifer Warner MRN: [id number] DOB: 02-Sep-1934   Complex Care Management Outreach Attempts: An unsuccessful telephone outreach was attempted today to offer the patient information about available complex care management services.  Follow Up Plan:  Additional outreach attempts will be made to offer the patient complex care management information and services.   Encounter Outcome:  No Answer  Thedford Franks, CMA [place]  [facility], [facility] Guide Direct Dial: [phone]  Fax: [phone] Website: [email or web address]

## 2024-04-17 DIAGNOSIS — I10 Essential (primary) hypertension: Secondary | ICD-10-CM | POA: Diagnosis not present

## 2024-04-17 DIAGNOSIS — K859 Acute pancreatitis without necrosis or infection, unspecified: Secondary | ICD-10-CM | POA: Diagnosis not present

## 2024-04-17 DIAGNOSIS — E119 Type 2 diabetes mellitus without complications: Secondary | ICD-10-CM | POA: Diagnosis not present

## 2024-04-17 DIAGNOSIS — Z794 Long term (current) use of insulin: Secondary | ICD-10-CM | POA: Diagnosis not present

## 2024-04-17 DIAGNOSIS — I959 Hypotension, unspecified: Secondary | ICD-10-CM | POA: Diagnosis not present

## 2024-04-17 NOTE — Progress Notes (Unsigned)
 Complex Care Management Note Care Guide Note  04/17/2024 Name: Jennifer Warner MRN: [id number] DOB: [birth year]   Complex Care Management Outreach Attempts: A second unsuccessful outreach was attempted today to offer the patient with information about available complex care management services.  Follow Up Plan:  Additional outreach attempts will be made to offer the patient complex care management information and services.   Encounter Outcome:  No Answer  Thedford Franks, CMA [place]  [facility], [facility] Guide Direct Dial: [phone]  Fax: [phone] Website: [email or web address]

## 2024-04-18 DIAGNOSIS — E162 Hypoglycemia, unspecified: Secondary | ICD-10-CM | POA: Diagnosis not present

## 2024-04-18 DIAGNOSIS — Z79899 Other long term (current) drug therapy: Secondary | ICD-10-CM | POA: Diagnosis not present

## 2024-04-18 DIAGNOSIS — I160 Hypertensive urgency: Secondary | ICD-10-CM | POA: Diagnosis not present

## 2024-04-18 DIAGNOSIS — R55 Syncope and collapse: Secondary | ICD-10-CM | POA: Diagnosis not present

## 2024-04-18 DIAGNOSIS — K219 Gastro-esophageal reflux disease without esophagitis: Secondary | ICD-10-CM | POA: Diagnosis not present

## 2024-04-18 DIAGNOSIS — R4781 Slurred speech: Secondary | ICD-10-CM | POA: Diagnosis not present

## 2024-04-18 DIAGNOSIS — R001 Bradycardia, unspecified: Secondary | ICD-10-CM | POA: Diagnosis not present

## 2024-04-18 DIAGNOSIS — I10 Essential (primary) hypertension: Secondary | ICD-10-CM | POA: Diagnosis not present

## 2024-04-18 DIAGNOSIS — R0902 Hypoxemia: Secondary | ICD-10-CM | POA: Diagnosis not present

## 2024-04-18 DIAGNOSIS — R410 Disorientation, unspecified: Secondary | ICD-10-CM | POA: Diagnosis not present

## 2024-04-18 DIAGNOSIS — E11649 Type 2 diabetes mellitus with hypoglycemia without coma: Secondary | ICD-10-CM | POA: Diagnosis not present

## 2024-04-18 DIAGNOSIS — J984 Other disorders of lung: Secondary | ICD-10-CM | POA: Diagnosis not present

## 2024-04-18 DIAGNOSIS — I2510 Atherosclerotic heart disease of native coronary artery without angina pectoris: Secondary | ICD-10-CM | POA: Diagnosis not present

## 2024-04-18 NOTE — Progress Notes (Signed)
 Complex Care Management Note Care Guide Note  04/18/2024 Name: Jennifer Warner MRN: [id number] DOB: [birth year]   Complex Care Management Outreach Attempts: A third unsuccessful outreach was attempted today to offer the patient with information about available complex care management services.  Follow Up Plan:  No further outreach attempts will be made at this time. We have been unable to contact the patient to offer or enroll patient in complex care management services.  Encounter Outcome:  No Answer  Thedford Franks, CMA [place]  [facility], [facility] Guide Direct Dial: [phone]  Fax: [phone] Website: [email or web address]

## 2024-04-19 DIAGNOSIS — R0989 Other specified symptoms and signs involving the circulatory and respiratory systems: Secondary | ICD-10-CM | POA: Diagnosis not present

## 2024-04-19 DIAGNOSIS — R410 Disorientation, unspecified: Secondary | ICD-10-CM | POA: Diagnosis not present

## 2024-04-19 DIAGNOSIS — E162 Hypoglycemia, unspecified: Secondary | ICD-10-CM | POA: Diagnosis not present

## 2024-04-20 DIAGNOSIS — E162 Hypoglycemia, unspecified: Secondary | ICD-10-CM | POA: Diagnosis not present

## 2024-04-20 DIAGNOSIS — R410 Disorientation, unspecified: Secondary | ICD-10-CM | POA: Diagnosis not present

## 2024-04-26 ENCOUNTER — Other Ambulatory Visit: Payer: Self-pay | Admitting: Cardiology

## 2024-04-30 DIAGNOSIS — I6522 Occlusion and stenosis of left carotid artery: Secondary | ICD-10-CM | POA: Diagnosis not present

## 2024-04-30 DIAGNOSIS — I4891 Unspecified atrial fibrillation: Secondary | ICD-10-CM | POA: Diagnosis not present

## 2024-04-30 DIAGNOSIS — I10 Essential (primary) hypertension: Secondary | ICD-10-CM | POA: Diagnosis not present

## 2024-04-30 DIAGNOSIS — E1165 Type 2 diabetes mellitus with hyperglycemia: Secondary | ICD-10-CM | POA: Diagnosis not present

## 2024-05-01 DIAGNOSIS — E119 Type 2 diabetes mellitus without complications: Secondary | ICD-10-CM | POA: Diagnosis not present

## 2024-05-01 DIAGNOSIS — I10 Essential (primary) hypertension: Secondary | ICD-10-CM | POA: Diagnosis not present

## 2024-05-01 DIAGNOSIS — I959 Hypotension, unspecified: Secondary | ICD-10-CM | POA: Diagnosis not present

## 2024-05-01 DIAGNOSIS — Z794 Long term (current) use of insulin: Secondary | ICD-10-CM | POA: Diagnosis not present

## 2024-05-01 DIAGNOSIS — K859 Acute pancreatitis without necrosis or infection, unspecified: Secondary | ICD-10-CM | POA: Diagnosis not present

## 2024-06-21 DIAGNOSIS — I10 Essential (primary) hypertension: Secondary | ICD-10-CM | POA: Diagnosis not present

## 2024-06-21 DIAGNOSIS — Z794 Long term (current) use of insulin: Secondary | ICD-10-CM | POA: Diagnosis not present

## 2024-06-21 DIAGNOSIS — G3184 Mild cognitive impairment, so stated: Secondary | ICD-10-CM | POA: Diagnosis not present

## 2024-06-21 DIAGNOSIS — I2510 Atherosclerotic heart disease of native coronary artery without angina pectoris: Secondary | ICD-10-CM | POA: Diagnosis not present

## 2024-06-21 DIAGNOSIS — D692 Other nonthrombocytopenic purpura: Secondary | ICD-10-CM | POA: Diagnosis not present

## 2024-06-21 DIAGNOSIS — I2699 Other pulmonary embolism without acute cor pulmonale: Secondary | ICD-10-CM | POA: Diagnosis not present

## 2024-06-21 DIAGNOSIS — N1831 Chronic kidney disease, stage 3a: Secondary | ICD-10-CM | POA: Diagnosis not present

## 2024-06-21 DIAGNOSIS — E7800 Pure hypercholesterolemia, unspecified: Secondary | ICD-10-CM | POA: Diagnosis not present

## 2024-06-21 DIAGNOSIS — D6869 Other thrombophilia: Secondary | ICD-10-CM | POA: Diagnosis not present

## 2024-06-21 DIAGNOSIS — E210 Primary hyperparathyroidism: Secondary | ICD-10-CM | POA: Diagnosis not present

## 2024-06-21 DIAGNOSIS — E1122 Type 2 diabetes mellitus with diabetic chronic kidney disease: Secondary | ICD-10-CM | POA: Diagnosis not present

## 2024-06-26 DIAGNOSIS — C44329 Squamous cell carcinoma of skin of other parts of face: Secondary | ICD-10-CM | POA: Diagnosis not present

## 2024-07-04 DIAGNOSIS — E7800 Pure hypercholesterolemia, unspecified: Secondary | ICD-10-CM | POA: Diagnosis not present

## 2024-07-04 DIAGNOSIS — E210 Primary hyperparathyroidism: Secondary | ICD-10-CM | POA: Diagnosis not present

## 2024-07-04 DIAGNOSIS — E1122 Type 2 diabetes mellitus with diabetic chronic kidney disease: Secondary | ICD-10-CM | POA: Diagnosis not present

## 2024-07-04 DIAGNOSIS — N1831 Chronic kidney disease, stage 3a: Secondary | ICD-10-CM | POA: Diagnosis not present

## 2024-07-04 DIAGNOSIS — I2699 Other pulmonary embolism without acute cor pulmonale: Secondary | ICD-10-CM | POA: Diagnosis not present

## 2024-07-04 DIAGNOSIS — Z794 Long term (current) use of insulin: Secondary | ICD-10-CM | POA: Diagnosis not present

## 2024-07-04 DIAGNOSIS — I10 Essential (primary) hypertension: Secondary | ICD-10-CM | POA: Diagnosis not present

## 2024-07-04 DIAGNOSIS — I2510 Atherosclerotic heart disease of native coronary artery without angina pectoris: Secondary | ICD-10-CM | POA: Diagnosis not present

## 2024-07-13 ENCOUNTER — Other Ambulatory Visit: Payer: Self-pay | Admitting: Cardiology

## 2024-07-17 DIAGNOSIS — D0439 Carcinoma in situ of skin of other parts of face: Secondary | ICD-10-CM | POA: Diagnosis not present

## 2024-09-23 ENCOUNTER — Other Ambulatory Visit: Payer: Self-pay | Admitting: Cardiology

## 2024-11-02 ENCOUNTER — Other Ambulatory Visit: Payer: Self-pay | Admitting: Cardiology
